# Patient Record
Sex: Female | Born: 1955
Health system: Southern US, Community
[De-identification: ages and names within clinical notes are randomized; demographics above are authoritative.]

## PROBLEM LIST (undated history)

## (undated) DIAGNOSIS — G709 Myoneural disorder, unspecified: Secondary | ICD-10-CM

## (undated) DIAGNOSIS — I10 Essential (primary) hypertension: Secondary | ICD-10-CM

## (undated) DIAGNOSIS — G20A1 Parkinson's disease without dyskinesia, without mention of fluctuations: Secondary | ICD-10-CM

## (undated) DIAGNOSIS — C801 Malignant (primary) neoplasm, unspecified: Secondary | ICD-10-CM

## (undated) DIAGNOSIS — F32A Depression, unspecified: Secondary | ICD-10-CM

## (undated) DIAGNOSIS — M1990 Unspecified osteoarthritis, unspecified site: Secondary | ICD-10-CM

## (undated) HISTORY — PX: BREAST BIOPSY: SHX20

## (undated) HISTORY — DX: Parkinson's disease without dyskinesia, without mention of fluctuations: G20.A1

## (undated) HISTORY — PX: BREAST EXCISIONAL BIOPSY: SUR124

## (undated) HISTORY — PX: BREAST SURGERY: SHX581

## (undated) HISTORY — PX: COLONOSCOPY: SHX174

## (undated) HISTORY — PX: APPENDECTOMY: SHX54

## (undated) HISTORY — DX: Depression, unspecified: F32.A

---

## 1986-10-08 DIAGNOSIS — J189 Pneumonia, unspecified organism: Secondary | ICD-10-CM

## 1986-10-08 HISTORY — DX: Pneumonia, unspecified organism: J18.9

## 1998-07-27 ENCOUNTER — Ambulatory Visit (HOSPITAL_COMMUNITY): Admission: RE | Admit: 1998-07-27 | Discharge: 1998-07-27 | Payer: Self-pay | Admitting: Obstetrics and Gynecology

## 1998-12-28 ENCOUNTER — Other Ambulatory Visit: Admission: RE | Admit: 1998-12-28 | Discharge: 1998-12-28 | Payer: Self-pay | Admitting: Obstetrics and Gynecology

## 1999-01-04 ENCOUNTER — Encounter: Admission: RE | Admit: 1999-01-04 | Discharge: 1999-04-04 | Payer: Self-pay | Admitting: Gynecology

## 1999-08-01 ENCOUNTER — Ambulatory Visit (HOSPITAL_COMMUNITY): Admission: RE | Admit: 1999-08-01 | Discharge: 1999-08-01 | Payer: Self-pay | Admitting: Obstetrics and Gynecology

## 1999-08-01 ENCOUNTER — Encounter: Payer: Self-pay | Admitting: Obstetrics and Gynecology

## 2000-03-27 ENCOUNTER — Other Ambulatory Visit: Admission: RE | Admit: 2000-03-27 | Discharge: 2000-03-27 | Payer: Self-pay | Admitting: Obstetrics and Gynecology

## 2000-08-06 ENCOUNTER — Encounter: Payer: Self-pay | Admitting: Obstetrics and Gynecology

## 2000-08-06 ENCOUNTER — Ambulatory Visit (HOSPITAL_COMMUNITY): Admission: RE | Admit: 2000-08-06 | Discharge: 2000-08-06 | Payer: Self-pay | Admitting: Obstetrics and Gynecology

## 2001-02-19 ENCOUNTER — Other Ambulatory Visit: Admission: RE | Admit: 2001-02-19 | Discharge: 2001-02-19 | Payer: Self-pay | Admitting: Obstetrics and Gynecology

## 2001-08-12 ENCOUNTER — Ambulatory Visit (HOSPITAL_COMMUNITY): Admission: RE | Admit: 2001-08-12 | Discharge: 2001-08-12 | Payer: Self-pay | Admitting: Obstetrics and Gynecology

## 2001-08-12 ENCOUNTER — Encounter: Payer: Self-pay | Admitting: Obstetrics and Gynecology

## 2002-03-24 ENCOUNTER — Other Ambulatory Visit: Admission: RE | Admit: 2002-03-24 | Discharge: 2002-03-24 | Payer: Self-pay | Admitting: Obstetrics and Gynecology

## 2002-08-13 ENCOUNTER — Encounter: Payer: Self-pay | Admitting: Obstetrics and Gynecology

## 2002-08-13 ENCOUNTER — Ambulatory Visit (HOSPITAL_COMMUNITY): Admission: RE | Admit: 2002-08-13 | Discharge: 2002-08-13 | Payer: Self-pay | Admitting: Obstetrics and Gynecology

## 2003-06-09 ENCOUNTER — Other Ambulatory Visit: Admission: RE | Admit: 2003-06-09 | Discharge: 2003-06-09 | Payer: Self-pay | Admitting: Obstetrics and Gynecology

## 2003-12-09 ENCOUNTER — Ambulatory Visit (HOSPITAL_COMMUNITY): Admission: RE | Admit: 2003-12-09 | Discharge: 2003-12-09 | Payer: Self-pay | Admitting: Obstetrics and Gynecology

## 2004-08-15 ENCOUNTER — Other Ambulatory Visit: Admission: RE | Admit: 2004-08-15 | Discharge: 2004-08-15 | Payer: Self-pay | Admitting: Obstetrics and Gynecology

## 2004-11-02 ENCOUNTER — Ambulatory Visit: Payer: Self-pay | Admitting: Family Medicine

## 2005-01-15 ENCOUNTER — Ambulatory Visit (HOSPITAL_COMMUNITY): Admission: RE | Admit: 2005-01-15 | Discharge: 2005-01-15 | Payer: Self-pay | Admitting: Obstetrics and Gynecology

## 2005-02-22 ENCOUNTER — Ambulatory Visit: Payer: Self-pay | Admitting: Family Medicine

## 2005-03-01 ENCOUNTER — Ambulatory Visit: Payer: Self-pay | Admitting: Family Medicine

## 2006-03-14 ENCOUNTER — Ambulatory Visit (HOSPITAL_COMMUNITY): Admission: RE | Admit: 2006-03-14 | Discharge: 2006-03-14 | Payer: Self-pay | Admitting: Obstetrics and Gynecology

## 2006-08-16 ENCOUNTER — Other Ambulatory Visit: Admission: RE | Admit: 2006-08-16 | Discharge: 2006-08-16 | Payer: Self-pay | Admitting: Obstetrics and Gynecology

## 2006-10-15 ENCOUNTER — Ambulatory Visit: Payer: Self-pay | Admitting: Family Medicine

## 2006-12-26 ENCOUNTER — Ambulatory Visit: Payer: Self-pay | Admitting: Family Medicine

## 2007-01-20 ENCOUNTER — Ambulatory Visit: Payer: Self-pay | Admitting: Family Medicine

## 2007-01-30 ENCOUNTER — Ambulatory Visit: Payer: Self-pay | Admitting: Family Medicine

## 2007-03-21 ENCOUNTER — Ambulatory Visit (HOSPITAL_COMMUNITY): Admission: RE | Admit: 2007-03-21 | Discharge: 2007-03-21 | Payer: Self-pay | Admitting: Obstetrics and Gynecology

## 2007-09-15 DIAGNOSIS — N3000 Acute cystitis without hematuria: Secondary | ICD-10-CM

## 2007-09-16 ENCOUNTER — Ambulatory Visit: Payer: Self-pay | Admitting: Family Medicine

## 2008-03-12 ENCOUNTER — Other Ambulatory Visit: Admission: RE | Admit: 2008-03-12 | Discharge: 2008-03-12 | Payer: Self-pay | Admitting: Obstetrics and Gynecology

## 2008-03-22 ENCOUNTER — Ambulatory Visit (HOSPITAL_COMMUNITY): Admission: RE | Admit: 2008-03-22 | Discharge: 2008-03-22 | Payer: Self-pay | Admitting: Obstetrics and Gynecology

## 2008-03-22 ENCOUNTER — Ambulatory Visit: Payer: Self-pay | Admitting: Internal Medicine

## 2008-04-06 ENCOUNTER — Ambulatory Visit: Payer: Self-pay | Admitting: Internal Medicine

## 2009-02-08 ENCOUNTER — Ambulatory Visit: Payer: Self-pay | Admitting: Family Medicine

## 2009-02-08 DIAGNOSIS — L02239 Carbuncle of trunk, unspecified: Secondary | ICD-10-CM | POA: Insufficient documentation

## 2009-02-08 DIAGNOSIS — L02229 Furuncle of trunk, unspecified: Secondary | ICD-10-CM

## 2009-02-23 ENCOUNTER — Ambulatory Visit: Payer: Self-pay | Admitting: Family Medicine

## 2009-02-23 LAB — CONVERTED CEMR LAB
ALT: 21 units/L (ref 0–35)
AST: 18 units/L (ref 0–37)
Albumin: 4.2 g/dL (ref 3.5–5.2)
BUN: 12 mg/dL (ref 6–23)
Basophils Relative: 0.7 % (ref 0.0–3.0)
Bilirubin, Direct: 0 mg/dL (ref 0.0–0.3)
CO2: 32 meq/L (ref 19–32)
Chloride: 104 meq/L (ref 96–112)
Cholesterol: 242 mg/dL — ABNORMAL HIGH (ref 0–200)
Eosinophils Relative: 0.6 % (ref 0.0–5.0)
Glucose, Bld: 88 mg/dL (ref 70–99)
HCT: 39 % (ref 36.0–46.0)
HDL: 61 mg/dL (ref >39.00)
Lymphocytes Relative: 34.5 % (ref 12.0–46.0)
Lymphs Abs: 1.9 10*3/uL (ref 0.7–4.0)
MCV: 92.3 fL (ref 78.0–100.0)
Monocytes Relative: 7.6 % (ref 3.0–12.0)
Neutrophils Relative %: 56.6 % (ref 43.0–77.0)
Platelets: 218 10*3/uL (ref 150.0–400.0)
Protein, U semiquant: NEGATIVE
RBC: 4.23 M/uL (ref 3.87–5.11)
Sodium: 141 meq/L (ref 135–145)
Specific Gravity, Urine: 1.02
Total Bilirubin: 0.7 mg/dL (ref 0.3–1.2)
Total Protein: 7.5 g/dL (ref 6.0–8.3)
VLDL: 14.2 mg/dL (ref 0.0–40.0)
WBC Urine, dipstick: NEGATIVE
WBC: 5.5 10*3/uL (ref 4.5–10.5)
pH: 6

## 2009-03-02 ENCOUNTER — Ambulatory Visit: Payer: Self-pay | Admitting: Family Medicine

## 2009-03-22 ENCOUNTER — Ambulatory Visit: Payer: Self-pay | Admitting: Family Medicine

## 2009-03-22 ENCOUNTER — Encounter: Payer: Self-pay | Admitting: Family Medicine

## 2009-03-23 ENCOUNTER — Ambulatory Visit: Payer: Self-pay | Admitting: Internal Medicine

## 2009-03-23 ENCOUNTER — Encounter: Payer: Self-pay | Admitting: Family Medicine

## 2009-03-24 DIAGNOSIS — L820 Inflamed seborrheic keratosis: Secondary | ICD-10-CM

## 2009-04-01 ENCOUNTER — Telehealth: Payer: Self-pay | Admitting: Family Medicine

## 2010-01-26 ENCOUNTER — Ambulatory Visit (HOSPITAL_COMMUNITY): Admission: RE | Admit: 2010-01-26 | Discharge: 2010-01-26 | Payer: Self-pay | Admitting: Obstetrics and Gynecology

## 2010-04-05 ENCOUNTER — Ambulatory Visit: Payer: Self-pay | Admitting: Internal Medicine

## 2010-04-05 DIAGNOSIS — L03039 Cellulitis of unspecified toe: Secondary | ICD-10-CM

## 2010-10-29 ENCOUNTER — Encounter: Payer: Self-pay | Admitting: Obstetrics and Gynecology

## 2010-11-09 NOTE — Assessment & Plan Note (Signed)
Summary: swollen foot/aches/cjr   Vital Signs:  Patient profile:   55 year old female Weight:      208 pounds Temp:     97.8 degrees F oral BP sitting:   120 / 70  (right arm) Cuff size:   regular  Vitals Entered By: Duard Brady LPN (April 05, 2010 9:18 AM) CC: c/o (L) foot swelling , great toe nail discoloration  Is Patient Diabetic? No   CC:  c/o (L) foot swelling  and great toe nail discoloration .  History of Present Illness: 55 year old patient who is complaining of slight pain, swelling, and redness involving the medial aspect of her left great toe.  She has been evaluated and treated by podiatry in the past and has had surgery for an ingrown nail.  she also complains of nail discoloration and some looseness of the nail.  Due to the painful toe, it has affected her gait, and she is also on some discomfort involving the arch of the left foot.  Denies any fever or chills  Preventive Screening-Counseling & Management  Alcohol-Tobacco     Smoking Status: never  Allergies (verified): No Known Drug Allergies  Physical Exam  General:  overweight-appearing.  no distress.  Blood pressure 120/70 Msk:  a mild paronychia noted involving the medial aspect of the left great toe; the nail was slightly loose, thickened and discolored.   Impression & Recommendations:  Problem # 1:  PARONYCHIA, LEFT GREAT TOE (ICD-681.11) patient has a mild paronychia; will treat with samples of Avelox for 5 days.  Patient Instructions: 1)  Please schedule a follow-up appointment as needed. 2)  Take your antibiotic as prescribed until ALL of it is gone, but stop if you develop a rash or swelling and contact our office as soon as possible.

## 2010-12-25 ENCOUNTER — Other Ambulatory Visit: Payer: Self-pay | Admitting: Obstetrics and Gynecology

## 2010-12-25 DIAGNOSIS — Z1231 Encounter for screening mammogram for malignant neoplasm of breast: Secondary | ICD-10-CM

## 2011-01-29 ENCOUNTER — Ambulatory Visit (HOSPITAL_COMMUNITY)
Admission: RE | Admit: 2011-01-29 | Discharge: 2011-01-29 | Disposition: A | Discharge status: Home / Self Care | Payer: 59 | Source: Ambulatory Visit | Attending: Obstetrics and Gynecology | Admitting: Obstetrics and Gynecology

## 2011-01-29 DIAGNOSIS — Z1231 Encounter for screening mammogram for malignant neoplasm of breast: Secondary | ICD-10-CM | POA: Insufficient documentation

## 2011-12-06 ENCOUNTER — Ambulatory Visit (INDEPENDENT_AMBULATORY_CARE_PROVIDER_SITE_OTHER): Payer: 59 | Admitting: Family Medicine

## 2011-12-06 ENCOUNTER — Encounter: Payer: Self-pay | Admitting: Family Medicine

## 2011-12-06 VITALS — BP 164/90 | Temp 97.8°F | Wt 188.0 lb

## 2011-12-06 DIAGNOSIS — L500 Allergic urticaria: Secondary | ICD-10-CM

## 2011-12-06 MED ORDER — LORAZEPAM 0.5 MG PO TABS: ORAL_TABLET | ORAL | Status: DC

## 2011-12-06 MED ORDER — PREDNISONE 20 MG PO TABS: ORAL_TABLET | ORAL | Status: DC

## 2011-12-06 NOTE — Progress Notes (Signed)
  Subjective:    Patient ID: Sara Nicholson, female    DOB: 08/16/1956, 56 y.o.   MRN: [id number]  HPI Sara Nicholson is a 56 year old married female nonsmoker who comes in today for evaluation of a drug reaction  She states that on about January 4 she saw podiatrist in [facility] who gave her Lamisil tablets for discoloration of her toenails. 2 weeks after starting the medicine she broke out in a rash on chest didn't think much about it continue to take the medicine. On February 17 she broke out all over and went to a dermatologist in [facility] the next day the 18th. She was diagnosed to have a drug reaction and given 40 mg of prednisone for 2 days with a taper however the rash is not going away.  She's been taking Claritin twice a day Atarax 50 each bedtime and still has insomnia from the prednisone.   Review of Systems    general and dermatologic review of systems otherwise negative no previous history of drug reactions Objective:   Physical Exam  Well-developed well-nourished female in no acute distress examination skin shows diffuse hives total body airway normal      Assessment & Plan:  Urticaria secondary to Lamisil plan increase prednisone to 60 mg daily and told rash abates and then taper , DC Atarax, Claritin 10 mg twice a day, Ativan each bedtime when necessary for sleep

## 2011-12-06 NOTE — Patient Instructions (Signed)
Take 3 prednisone tablets now then 3 tablets daily until rash clears then taper as directed  Ativan 0.5 each bedtime when necessary........... you may take it 3 times daily if you need it  Claritin 10 mg plain twice daily  Do the skin treatment we discussed  Return in one week for followup sooner if any problems

## 2011-12-13 ENCOUNTER — Encounter: Payer: Self-pay | Admitting: Family Medicine

## 2011-12-13 ENCOUNTER — Ambulatory Visit (INDEPENDENT_AMBULATORY_CARE_PROVIDER_SITE_OTHER): Payer: 59 | Admitting: Family Medicine

## 2011-12-13 DIAGNOSIS — L500 Allergic urticaria: Secondary | ICD-10-CM

## 2011-12-13 DIAGNOSIS — T50905A Adverse effect of unspecified drugs, medicaments and biological substances, initial encounter: Secondary | ICD-10-CM

## 2011-12-13 NOTE — Progress Notes (Signed)
  Subjective:    Patient ID: Sara Nicholson, female    DOB: Apr 08, 1956, 56 y.o.   MRN: [id number]  HPI Shawn is a 56 year old married female nonsmoker who comes in today for evaluation of urticaria secondary to a medication called Lamisil  We saw her the other day with diffuse urticaria and began 60 mg of prednisone daily. She comes back today saying she's about 80% better. The rash is not completely gone but most of the itching is gone. She is having significant CNS side effects from the steroids weight gain blood pressure 140/90 fluid retention     Review of Systems General and dermatologic review of systems otherwise negative    Objective:   Physical Exam  Well-developed well-nourished female in no acute distress examination of skin shows the rash to be markedly diminished although still present on her back abdomen chest      Assessment & Plan:  Urticaria secondary to Lamisil taper prednisone slowly

## 2012-01-22 ENCOUNTER — Ambulatory Visit (INDEPENDENT_AMBULATORY_CARE_PROVIDER_SITE_OTHER): Payer: 59 | Admitting: Family

## 2012-01-22 ENCOUNTER — Encounter: Payer: Self-pay | Admitting: Family

## 2012-01-22 VITALS — BP 138/90 | Temp 98.2°F | Wt 190.0 lb

## 2012-01-22 DIAGNOSIS — R05 Cough: Secondary | ICD-10-CM

## 2012-01-22 DIAGNOSIS — J0100 Acute maxillary sinusitis, unspecified: Secondary | ICD-10-CM

## 2012-01-22 MED ORDER — HYDROCOD POLST-CHLORPHEN POLST 10-8 MG/5ML PO LQCR: 5.0000 mL | Freq: Two times a day (BID) | ORAL | Status: DC | PRN

## 2012-01-22 MED ORDER — AMOXICILLIN 500 MG PO TABS: 1000.0000 mg | ORAL_TABLET | Freq: Two times a day (BID) | ORAL | Status: AC

## 2012-01-22 NOTE — Progress Notes (Signed)
  Subjective:    Patient ID: Sara Nicholson, female    DOB: [birth year], 56 y.o.   MRN: [id number]  Sinusitis This is a recurrent problem. The current episode started more than 1 month ago. The problem has been gradually worsening since onset. There has been no fever. The pain is moderate. Associated symptoms include congestion, coughing, headaches, sinus pressure, sneezing, a sore throat and swollen glands. Past treatments include oral decongestants. The treatment provided no relief.      Review of Systems  Constitutional: Negative.   HENT: Positive for congestion, sore throat, sneezing and sinus pressure.   Respiratory: Positive for cough.   Cardiovascular: Negative.   Gastrointestinal: Negative.   Neurological: Positive for headaches.  Hematological: Negative.   Psychiatric/Behavioral: Negative.    No past medical history on file.  History   Social History  . Marital Status: Married    Spouse Name: N/A    Number of Children: N/A  . Years of Education: N/A   Occupational History  . Not on file.   Social History Main Topics  . Smoking status: Never Smoker   . Smokeless tobacco: Not on file  . Alcohol Use:   . Drug Use:   . Sexually Active:    Other Topics Concern  . Not on file   Social History Narrative  . No narrative on file    No past surgical history on file.  No family history on file.  Allergies  Allergen Reactions  . Lamisil     Current Outpatient Prescriptions on File Prior to Visit  Medication Sig Dispense Refill  . LORazepam (ATIVAN) 0.5 MG tablet 1 tablet each bedtime when necessary  30 tablet  1  . predniSONE (DELTASONE) 20 MG tablet 3 tabs now then 3 tabs every morning until rash clears then taper 2 tabs x3 days, 1 tab x3 days, a half a tab x3 days, then a half a tablet Monday Wednesday Friday for a 3 week taper  50 tablet  1    BP 138/90  Temp(Src) 98.2 F (36.8 C) (Oral)  Wt 190 lb (86.183 kg)chart    Objective:   Physical Exam    Constitutional: She is oriented to person, place, and time. She appears well-developed and well-nourished.  HENT:  Right Ear: External ear normal.  Left Ear: External ear normal.  Nose: Nose normal.  Mouth/Throat: Oropharynx is clear and moist.  Neck: Normal range of motion. Neck supple.  Cardiovascular: Normal rate, regular rhythm and normal heart sounds.   Pulmonary/Chest: Effort normal and breath sounds normal.  Musculoskeletal: Normal range of motion.  Neurological: She is alert and oriented to person, place, and time.  Skin: Skin is warm and dry.          Assessment & Plan:  Assessment: Acute Sinusitis, Cough  Plan: Tussionex, Amoxil,  Rest, drink plenty of fluids. Call the office if symptoms worsen or persist. Recheck as scheduled and prn.

## 2012-01-22 NOTE — Patient Instructions (Signed)

## 2012-02-27 ENCOUNTER — Other Ambulatory Visit: Payer: Self-pay | Admitting: Obstetrics and Gynecology

## 2012-02-27 DIAGNOSIS — Z1231 Encounter for screening mammogram for malignant neoplasm of breast: Secondary | ICD-10-CM

## 2012-03-25 ENCOUNTER — Ambulatory Visit (HOSPITAL_COMMUNITY)
Admission: RE | Admit: 2012-03-25 | Discharge: 2012-03-25 | Disposition: A | Discharge status: Home / Self Care | Payer: 59 | Source: Ambulatory Visit | Attending: Obstetrics and Gynecology | Admitting: Obstetrics and Gynecology

## 2012-03-25 DIAGNOSIS — Z1231 Encounter for screening mammogram for malignant neoplasm of breast: Secondary | ICD-10-CM | POA: Insufficient documentation

## 2012-03-31 ENCOUNTER — Other Ambulatory Visit: Payer: Self-pay | Admitting: Obstetrics and Gynecology

## 2012-03-31 DIAGNOSIS — R928 Other abnormal and inconclusive findings on diagnostic imaging of breast: Secondary | ICD-10-CM

## 2012-04-04 ENCOUNTER — Other Ambulatory Visit: Payer: Self-pay | Admitting: Obstetrics and Gynecology

## 2012-04-04 ENCOUNTER — Ambulatory Visit
Admission: RE | Admit: 2012-04-04 | Discharge: 2012-04-04 | Disposition: A | Discharge status: Home / Self Care | Payer: 59 | Source: Ambulatory Visit | Attending: Obstetrics and Gynecology | Admitting: Obstetrics and Gynecology

## 2012-04-04 DIAGNOSIS — R928 Other abnormal and inconclusive findings on diagnostic imaging of breast: Secondary | ICD-10-CM

## 2012-04-04 DIAGNOSIS — N6320 Unspecified lump in the left breast, unspecified quadrant: Secondary | ICD-10-CM

## 2012-04-07 ENCOUNTER — Other Ambulatory Visit: Payer: Self-pay | Admitting: Obstetrics and Gynecology

## 2012-04-07 ENCOUNTER — Ambulatory Visit
Admission: RE | Admit: 2012-04-07 | Discharge: 2012-04-07 | Disposition: A | Discharge status: Home / Self Care | Payer: 59 | Source: Ambulatory Visit | Attending: Obstetrics and Gynecology | Admitting: Obstetrics and Gynecology

## 2012-04-07 DIAGNOSIS — N6320 Unspecified lump in the left breast, unspecified quadrant: Secondary | ICD-10-CM

## 2013-03-13 ENCOUNTER — Other Ambulatory Visit: Payer: Self-pay | Admitting: Obstetrics and Gynecology

## 2013-03-13 DIAGNOSIS — Z1231 Encounter for screening mammogram for malignant neoplasm of breast: Secondary | ICD-10-CM

## 2013-03-16 ENCOUNTER — Telehealth: Payer: Self-pay | Admitting: Gynecology

## 2013-03-16 ENCOUNTER — Ambulatory Visit (INDEPENDENT_AMBULATORY_CARE_PROVIDER_SITE_OTHER): Payer: 59 | Admitting: Family Medicine

## 2013-03-16 ENCOUNTER — Encounter: Payer: Self-pay | Admitting: Family Medicine

## 2013-03-16 VITALS — BP 130/80 | Temp 98.7°F | Wt 196.0 lb

## 2013-03-16 DIAGNOSIS — T148 Other injury of unspecified body region: Secondary | ICD-10-CM

## 2013-03-16 DIAGNOSIS — N951 Menopausal and female climacteric states: Secondary | ICD-10-CM | POA: Insufficient documentation

## 2013-03-16 DIAGNOSIS — W57XXXA Bitten or stung by nonvenomous insect and other nonvenomous arthropods, initial encounter: Secondary | ICD-10-CM | POA: Insufficient documentation

## 2013-03-16 DIAGNOSIS — N959 Unspecified menopausal and perimenopausal disorder: Secondary | ICD-10-CM

## 2013-03-16 MED ORDER — CONJ ESTROG-MEDROXYPROGEST ACE 0.3-1.5 MG PO TABS: 1.0000 | ORAL_TABLET | Freq: Every day | ORAL | Status: DC

## 2013-03-16 NOTE — Patient Instructions (Signed)
Begin Prempro one tablet daily at bedtime  Aspirin 1 daily  Followup in 4 weeks

## 2013-03-16 NOTE — Telephone Encounter (Signed)
CALLED AND LEFT MESSAGE TO RETURN CALL TO OUR OFFICE FOR PAP SMEAR REPORT.. IN PROCESS OF TYPING PATIENT RETURNED CALL. PATIENT REQUEST THAT HER PAP SMEAR REPORT BE SCANNED INTO HER CHART IN EPIC DUE TO INSURANCE REASONS AND FOR HER PCP TO BE ABLE TO SEE REPORT. PAPER CHART REPORT PUT IN SCANNING AREA FOR TO BE SCANNED INTO EPIC. SUE

## 2013-03-16 NOTE — Progress Notes (Signed)
  Subjective:    Patient ID: Sara Nicholson, female    DOB: 03-23-1956, 57 y.o.   MRN: [id number]  HPI Sara Nicholson is a 57 year old female married nonsmoker who comes in today for evaluation of 2 problems  She has a bug bite on her left lower leg it doesn't seem like it's healing  In conversation she says she's not feeling well. She feels depressed she can't sleep at night she is waking up soaking wet and other symptoms of menopause. Her LMP was about 2 years ago. She had a pelvic by her GYN which was normal. Over the last couple months her symptoms are getting worse. She would like to do something about it   Review of Systems Review of systems otherwise negative    Objective:   Physical Exam Well-developed well-nourished female no acute distress examination the skin shows a dime size hemorrhagic lesion left lower extremity probably just some hemorrhage around a spider bite       Assessment & Plan:  Lesion lower extremity observe  Postmenopausal symptoms with severe symptoms of depression mood changes hot flashes and sleep dysfunction begin HRT

## 2013-03-16 NOTE — Telephone Encounter (Signed)
Patient was wanting to know the results of her Pap smear. She was in with her regular doctor and he had told her that she was due for her pap. She told him that she had had it in March. He said he saw no record of it.

## 2013-03-17 NOTE — Telephone Encounter (Signed)
Report was scanned 03/16/13

## 2013-03-31 ENCOUNTER — Ambulatory Visit (HOSPITAL_COMMUNITY)
Admission: RE | Admit: 2013-03-31 | Discharge: 2013-03-31 | Disposition: A | Discharge status: Home / Self Care | Payer: 59 | Source: Ambulatory Visit | Attending: Obstetrics and Gynecology | Admitting: Obstetrics and Gynecology

## 2013-03-31 DIAGNOSIS — Z1231 Encounter for screening mammogram for malignant neoplasm of breast: Secondary | ICD-10-CM | POA: Insufficient documentation

## 2013-04-02 ENCOUNTER — Other Ambulatory Visit: Payer: Self-pay | Admitting: Obstetrics and Gynecology

## 2013-04-02 DIAGNOSIS — R928 Other abnormal and inconclusive findings on diagnostic imaging of breast: Secondary | ICD-10-CM

## 2013-04-14 ENCOUNTER — Encounter: Payer: Self-pay | Admitting: Family Medicine

## 2013-04-14 ENCOUNTER — Ambulatory Visit (INDEPENDENT_AMBULATORY_CARE_PROVIDER_SITE_OTHER): Payer: 59 | Admitting: Family Medicine

## 2013-04-14 VITALS — BP 130/80 | Temp 98.1°F | Wt 191.0 lb

## 2013-04-14 DIAGNOSIS — N6011 Diffuse cystic mastopathy of right breast: Secondary | ICD-10-CM

## 2013-04-14 DIAGNOSIS — N6019 Diffuse cystic mastopathy of unspecified breast: Secondary | ICD-10-CM

## 2013-04-14 NOTE — Progress Notes (Signed)
  Subjective:    Patient ID: Sara Nicholson, female    DOB: [birth year], 57 y.o.   MRN: [id number]  Sara Nicholson is a 57 year old married female nonsmoker who comes in today for evaluation of 2 problems  We started her on HRT Prempro 0.3-1.5 daily because of severe hot flushes and sleep dysfunction. She now is sleeping well the hot flashes are present but minimal.  No side effects from the medication  She recently had a screening mammogram which showed some abnormalities in her right breast. She's to go back to the breast Center on July 9 for followup ultrasound and possible tomography. She had a cystic lesion left breast which was aspirated last year and she previously had a lesion on her left breast that was removed surgically about 10 years ago.    Review of Systems    review of systems otherwise negative Objective:   Physical Exam Well-developed and nourished female no acute distress vital signs stable afebrile BP normal 130/80  Breast exam shows a scar left breast 9:00 from previous excisional biopsy 10 years ago. No palpable masses  Right breast appears normal skin normal no nipple discharge or areolar rash. There is a palpable lesion an inch from her nipple at the 2:00 position. It soft rubbery movable. She also has some thickening in the rubbery mass at the 9:00 position just adjacent to the nipple. She also has multiple other BB sized fibrocystic changes.       Assessment & Plan:  Postmenopausal with HRT working well continue that for now  Fibrocystic breast changes followup in radiology

## 2013-04-14 NOTE — Patient Instructions (Signed)
Continue the HRT  Take a baby aspirin once daily  Followup in 3 months sooner if any problems

## 2013-04-15 ENCOUNTER — Ambulatory Visit
Admission: RE | Admit: 2013-04-15 | Discharge: 2013-04-15 | Disposition: A | Discharge status: Home / Self Care | Payer: 59 | Source: Ambulatory Visit | Attending: Obstetrics and Gynecology | Admitting: Obstetrics and Gynecology

## 2013-04-15 ENCOUNTER — Ambulatory Visit: Payer: 59 | Admitting: Family Medicine

## 2013-04-15 DIAGNOSIS — R928 Other abnormal and inconclusive findings on diagnostic imaging of breast: Secondary | ICD-10-CM

## 2013-05-04 ENCOUNTER — Other Ambulatory Visit: Payer: Self-pay | Admitting: *Deleted

## 2013-05-04 DIAGNOSIS — N951 Menopausal and female climacteric states: Secondary | ICD-10-CM

## 2013-05-04 MED ORDER — CONJ ESTROG-MEDROXYPROGEST ACE 0.3-1.5 MG PO TABS: 1.0000 | ORAL_TABLET | Freq: Every day | ORAL | Status: DC

## 2013-07-16 ENCOUNTER — Encounter: Payer: Self-pay | Admitting: Family Medicine

## 2013-07-16 ENCOUNTER — Ambulatory Visit (INDEPENDENT_AMBULATORY_CARE_PROVIDER_SITE_OTHER): Payer: 59 | Admitting: Family Medicine

## 2013-07-16 VITALS — BP 130/80 | Temp 98.0°F | Wt 188.0 lb

## 2013-07-16 DIAGNOSIS — N959 Unspecified menopausal and perimenopausal disorder: Secondary | ICD-10-CM

## 2013-07-16 DIAGNOSIS — N951 Menopausal and female climacteric states: Secondary | ICD-10-CM

## 2013-07-16 NOTE — Patient Instructions (Signed)
Continue current medications  Take a baby aspirin daily  Return in the spring for CPX

## 2013-07-16 NOTE — Progress Notes (Signed)
  Subjective:    Patient ID: Sara Nicholson, female    DOB: [birth year], 57 y.o.   MRN: [id number]  HPI [name] is a 57 year old married female nonsmoker who comes in today for followup of HRT  She came in a while back with severe menopausal symptoms. We discussed various options including the use of HRT to Prempro. She elected to try a low dose with the understanding of all the potential side effects. She comes back today for followup. She says she feels well blood pressure today is normal. She forgot to take an aspirin tablet daily.  She says she feels better no hot flashes sleep is better her sex life is better    Review of Systems    review of systems negative Objective:   Physical Exam  Well-developed well-nourished female no acute distress vital signs stable afebrile BP normal      Assessment & Plan:  Postmenopausal with improvement of symptoms with HRT continue low-dose HRT return in the spring for CPX

## 2013-08-13 ENCOUNTER — Other Ambulatory Visit: Payer: Self-pay

## 2013-10-23 ENCOUNTER — Other Ambulatory Visit: Payer: Self-pay | Admitting: Family Medicine

## 2013-10-23 DIAGNOSIS — N6310 Unspecified lump in the right breast, unspecified quadrant: Secondary | ICD-10-CM

## 2013-11-06 ENCOUNTER — Ambulatory Visit
Admission: RE | Admit: 2013-11-06 | Discharge: 2013-11-06 | Disposition: A | Discharge status: Home / Self Care | Payer: 59 | Source: Ambulatory Visit | Attending: Family Medicine | Admitting: Family Medicine

## 2013-11-06 DIAGNOSIS — N6310 Unspecified lump in the right breast, unspecified quadrant: Secondary | ICD-10-CM

## 2013-12-08 ENCOUNTER — Other Ambulatory Visit (INDEPENDENT_AMBULATORY_CARE_PROVIDER_SITE_OTHER): Payer: 59

## 2013-12-08 DIAGNOSIS — Z0000 Encounter for general adult medical examination without abnormal findings: Secondary | ICD-10-CM

## 2013-12-08 LAB — BASIC METABOLIC PANEL
BUN: 12 mg/dL (ref 6–23)
CHLORIDE: 103 meq/L (ref 96–112)
CO2: 31 meq/L (ref 19–32)
CREATININE: 0.7 mg/dL (ref 0.4–1.2)
Calcium: 9.4 mg/dL (ref 8.4–10.5)
GFR: 97.82 mL/min (ref >60.00)
Glucose, Bld: 85 mg/dL (ref 70–99)
POTASSIUM: 4.3 meq/L (ref 3.5–5.1)
Sodium: 137 mEq/L (ref 135–145)

## 2013-12-08 LAB — POCT URINALYSIS DIPSTICK
Glucose, UA: NEGATIVE
LEUKOCYTES UA: NEGATIVE
NITRITE UA: NEGATIVE
PH UA: 5.5
PROTEIN UA: NEGATIVE
Spec Grav, UA: >=1.03
Urobilinogen, UA: 0.2

## 2013-12-08 LAB — CBC WITH DIFFERENTIAL/PLATELET
Basophils Absolute: 0 10*3/uL (ref 0.0–0.1)
Basophils Relative: 0.8 % (ref 0.0–3.0)
EOS ABS: 0 10*3/uL (ref 0.0–0.7)
EOS PCT: 0.6 % (ref 0.0–5.0)
HEMATOCRIT: 41.3 % (ref 36.0–46.0)
Hemoglobin: 13.7 g/dL (ref 12.0–15.0)
LYMPHS ABS: 2 10*3/uL (ref 0.7–4.0)
Lymphocytes Relative: 38.5 % (ref 12.0–46.0)
MCHC: 33.2 g/dL (ref 30.0–36.0)
MCV: 94.1 fl (ref 78.0–100.0)
MONO ABS: 0.4 10*3/uL (ref 0.1–1.0)
Monocytes Relative: 7.1 % (ref 3.0–12.0)
NEUTROS PCT: 53 % (ref 43.0–77.0)
Neutro Abs: 2.7 10*3/uL (ref 1.4–7.7)
Platelets: 219 10*3/uL (ref 150.0–400.0)
RBC: 4.39 Mil/uL (ref 3.87–5.11)
RDW: 13.2 % (ref 11.5–14.6)
WBC: 5.1 10*3/uL (ref 4.5–10.5)

## 2013-12-08 LAB — LIPID PANEL
CHOLESTEROL: 221 mg/dL — AB (ref 0–200)
HDL: 68.8 mg/dL (ref >39.00)
LDL Cholesterol: 135 mg/dL — ABNORMAL HIGH (ref 0–99)
Total CHOL/HDL Ratio: 3
Triglycerides: 87 mg/dL (ref 0.0–149.0)
VLDL: 17.4 mg/dL (ref 0.0–40.0)

## 2013-12-08 LAB — TSH: TSH: 0.99 u[IU]/mL (ref 0.35–5.50)

## 2013-12-08 LAB — HEPATIC FUNCTION PANEL
ALT: 34 U/L (ref 0–35)
AST: 26 U/L (ref 0–37)
Albumin: 4.2 g/dL (ref 3.5–5.2)
Alkaline Phosphatase: 72 U/L (ref 39–117)
BILIRUBIN DIRECT: 0 mg/dL (ref 0.0–0.3)
Total Bilirubin: 1.1 mg/dL (ref 0.3–1.2)
Total Protein: 7.5 g/dL (ref 6.0–8.3)

## 2013-12-15 ENCOUNTER — Encounter: Payer: Self-pay | Admitting: Family Medicine

## 2013-12-15 ENCOUNTER — Ambulatory Visit (INDEPENDENT_AMBULATORY_CARE_PROVIDER_SITE_OTHER): Payer: 59 | Admitting: Family Medicine

## 2013-12-15 VITALS — BP 140/80 | Temp 98.0°F | Ht 64.0 in | Wt 190.0 lb

## 2013-12-15 DIAGNOSIS — N6019 Diffuse cystic mastopathy of unspecified breast: Secondary | ICD-10-CM

## 2013-12-15 DIAGNOSIS — N959 Unspecified menopausal and perimenopausal disorder: Secondary | ICD-10-CM

## 2013-12-15 DIAGNOSIS — N951 Menopausal and female climacteric states: Secondary | ICD-10-CM

## 2013-12-15 MED ORDER — CONJ ESTROG-MEDROXYPROGEST ACE 0.3-1.5 MG PO TABS: 1.0000 | ORAL_TABLET | Freq: Every day | ORAL | Status: DC

## 2013-12-15 NOTE — Patient Instructions (Addendum)
Continue diet and exercise program  Continue Prempro one tablet daily  Followup in 1 year sooner if any problem  Try to remember to do a good breast exam once a month on your birthday

## 2013-12-15 NOTE — Progress Notes (Signed)
Pre visit review using our clinic review tool, if applicable. No additional management support is needed unless otherwise documented below in the visit note. 

## 2013-12-15 NOTE — Progress Notes (Signed)
   Subjective:    Patient ID: Sara Nicholson, female    DOB: Jan 08, 1956, 58 y.o.   MRN: [id number]  HPI [name] is a delightful 58 year old married female nonsmoker who comes in today for general physical examination  We started her on Prempro 0.3 last year because of severe post menopausal hot flashes and insomnia. She's not sleeping well no complaints no side effects to medication  Her weight is 190 pounds. She's lost 4 pounds she beginning a diet and exercise program.  She gets routine eye care, dental care, does not check her breasts monthly however she does get annual mammogram. Her mammograms are difficult to interpret because she has multiple cysts. Colonoscopy originally in her 58s was normal. Tetanus booster 2010.   Review of Systems  Constitutional: Negative.   HENT: Negative.   Eyes: Negative.   Respiratory: Negative.   Cardiovascular: Negative.   Gastrointestinal: Negative.   Endocrine: Negative.   Genitourinary: Negative.   Musculoskeletal: Negative.   Allergic/Immunologic: Negative.   Neurological: Negative.   Hematological: Negative.   Psychiatric/Behavioral: Negative.        Objective:   Physical Exam  Nursing note and vitals reviewed. Constitutional: She appears well-developed and well-nourished.  HENT:  Head: Normocephalic and atraumatic.  Right Ear: External ear normal.  Left Ear: External ear normal.  Nose: Nose normal.  Mouth/Throat: Oropharynx is clear and moist.  Eyes: EOM are normal. Pupils are equal, round, and reactive to light.  Neck: Normal range of motion. Neck supple. No thyromegaly present.  Cardiovascular: Normal rate, regular rhythm, normal heart sounds and intact distal pulses.  Exam reveals no gallop and no friction rub.   No murmur heard. Pulmonary/Chest: Effort normal and breath sounds normal.  Abdominal: Soft. Bowel sounds are normal. She exhibits no distension and no mass. There is no tenderness. There is no rebound.  Genitourinary:     No history of GYN disorder Pap smear every 3-5 years Bilateral breast exam normal except for multiple small fibrocystic changes  Musculoskeletal: Normal range of motion.  Lymphadenopathy:    She has no cervical adenopathy.  Neurological: She is alert. She has normal reflexes. No cranial nerve deficit. She exhibits normal muscle tone. Coordination normal.  Skin: Skin is warm and dry.  Total body skin exam normal  Psychiatric: She has a normal mood and affect. Her behavior is normal. Judgment and thought content normal.          Assessment & Plan:  Healthy female  Postmenopausal hot flashes continue Prempro  Overweight continue diet exercise and weight loss  Fibrocystic breast changes follow recommendations for mammography

## 2014-04-07 ENCOUNTER — Other Ambulatory Visit: Payer: Self-pay | Admitting: Family Medicine

## 2014-04-07 DIAGNOSIS — N6310 Unspecified lump in the right breast, unspecified quadrant: Secondary | ICD-10-CM

## 2014-04-28 ENCOUNTER — Other Ambulatory Visit: Payer: Self-pay | Admitting: Family Medicine

## 2014-04-28 ENCOUNTER — Ambulatory Visit
Admission: RE | Admit: 2014-04-28 | Discharge: 2014-04-28 | Disposition: A | Discharge status: Home / Self Care | Payer: 59 | Source: Ambulatory Visit | Attending: Family Medicine | Admitting: Family Medicine

## 2014-04-28 DIAGNOSIS — N6310 Unspecified lump in the right breast, unspecified quadrant: Secondary | ICD-10-CM

## 2014-06-15 ENCOUNTER — Encounter: Payer: Self-pay | Admitting: Internal Medicine

## 2015-01-24 ENCOUNTER — Other Ambulatory Visit: Payer: Self-pay | Admitting: Family Medicine

## 2015-06-15 ENCOUNTER — Other Ambulatory Visit: Payer: Self-pay

## 2015-06-15 DIAGNOSIS — Z1231 Encounter for screening mammogram for malignant neoplasm of breast: Secondary | ICD-10-CM

## 2015-06-21 ENCOUNTER — Ambulatory Visit
Admission: RE | Admit: 2015-06-21 | Discharge: 2015-06-21 | Disposition: A | Discharge status: Home / Self Care | Payer: Commercial Managed Care - HMO | Source: Ambulatory Visit

## 2015-06-21 DIAGNOSIS — Z1231 Encounter for screening mammogram for malignant neoplasm of breast: Secondary | ICD-10-CM

## 2015-07-12 ENCOUNTER — Telehealth: Payer: Self-pay | Admitting: Family Medicine

## 2015-07-12 NOTE — Telephone Encounter (Signed)
Pt would like her chart updated that she received flu shot at work today, [employer]

## 2015-11-08 ENCOUNTER — Telehealth: Payer: Self-pay | Admitting: Family Medicine

## 2015-11-08 NOTE — Telephone Encounter (Signed)
Pt call to say she need a referral to see [facility]. Does she need an appt.

## 2015-11-09 NOTE — Telephone Encounter (Signed)
No appointment needed. Referral placed.

## 2016-01-05 ENCOUNTER — Ambulatory Visit (INDEPENDENT_AMBULATORY_CARE_PROVIDER_SITE_OTHER): Payer: Managed Care, Other (non HMO) | Admitting: Family Medicine

## 2016-01-05 ENCOUNTER — Encounter: Payer: Self-pay | Admitting: Family Medicine

## 2016-01-05 VITALS — BP 146/80 | HR 57 | Temp 98.5°F | Resp 20 | Ht 64.0 in | Wt 210.0 lb

## 2016-01-05 DIAGNOSIS — R030 Elevated blood-pressure reading, without diagnosis of hypertension: Secondary | ICD-10-CM | POA: Diagnosis not present

## 2016-01-05 DIAGNOSIS — L84 Corns and callosities: Secondary | ICD-10-CM

## 2016-01-05 NOTE — Patient Instructions (Signed)
Corns and Calluses Corns are small areas of thickened skin that occur on the top, sides, or tip of a toe. They contain a cone-shaped core with a point that can press on a nerve below. This causes pain. Calluses are areas of thickened skin that can occur anywhere on the body including hands, fingers, palms, soles of the feet, and heels.Calluses are usually larger than corns.  CAUSES  Corns and calluses are caused by rubbing (friction) or pressure, such as from shoes that are too tight or do not fit properly.  RISK FACTORS Corns are more likely to develop in people who have toe deformities, such as hammer toes. Since calluses can occur with friction to any area of the skin, calluses are more likely to develop in people who:   Work with their hands.  Wear shoes that fit poorly, shoes that are too tight, or shoes that are high-heeled.  Have toes deformities. SYMPTOMS Symptoms of a corn or callus include:  A hard growth on the skin.   Pain or tenderness under the skin.   Redness and swelling.   Increased discomfort while wearing tight-fitting shoes. DIAGNOSIS  Corns and calluses may be diagnosed with a medical history and physical exam.  TREATMENT  Corns and calluses may be treated with:  Removing the cause of the friction or pressure. This may include:  Changing your shoes.  Wearing shoe inserts (orthotics) or other protective layers in your shoes, such as a corn pad.  Wearing gloves.  Medicines to help soften skin in the hardened, thickened areas.  Reducing the size of the corn or callus by removing the dead layers of skin.  Antibiotic medicines to treat infection.  Surgery, if a toe deformity is the cause. HOME CARE INSTRUCTIONS   If you were prescribed an antibiotic, finish all of it even if you start to feel better.  Wear shoes that fit well. Avoid wearing high-heeled shoes and shoes that are too tight or too loose.  Wear any padding, protective layers, gloves, or  orthotics as directed by your health care provider.  Soak your hands or feet and then use a file or pumice stone to soften your corn or callus. Do this as directed by your health care provider.  Check your corn or callus every day for signs of infection. Watch for:  Redness, swelling, or pain.  Fluid, blood, or pus.  Podiatry evaluation if symptoms get worse. .  Blood pressure slightly elevated. Monitor BP at home if possible. Continue regular exercise. DASH diet may help to prevent hypertension: high in vegetables, fruits, low-fat dairy products, whole grains, poultry, fish, and nuts; and low in sweets, sugar-sweetened beverages, and red meats.

## 2016-01-05 NOTE — Progress Notes (Signed)
HPI:  Sara Nicholson is here today concerned about "lump" she felt a couple weeks ago when she was rubbing right forearm. She has not noted any changes and notes that left forearm has similar feeling. No hx of trauma, no limitation of ROM, no skin changes. She just wants to be sure it is not something she needs to be worried about.  She is also c/o 2 months of left foot lesion on plantar area, under 5th MTP joint, no Hx of trauma. She has noted mild "stinging" sensation upon walking after exercising and worse with certain shoes. No limitation of her daily activities, she feels like it may be growing a "little" since first noted. Relieved with rest. She has Hx of plantar fascitis, has folowed ith podiatrists in the past.  Her BP is mildly elevated today.She denies any hx of HTN, does not check BP regularly but her BP's during prior visits to her gyn reported in the 120's/70's. She has not noted severe headaches, chest pain, dyspnea, abdominal pain, or edema. + Stress at work.    Drug Allergies:  Allergies  Allergen Reactions  . Terbinafine Hcl    ROS:  CONSTITUTIONAL: Denies fever, chills, fatigue, changes in appetite.  HEENT: Negative for associated oral lesions, odynophagia, or dysphagia.  HEART:Denies chest pain, palpitations, exertional dyspnea, edema, or cold extremity.  LUNG: Denies cough, dyspnea, wheezing, hemoptysis.  ABDOMEN: Denies changes in bowel habits, nausea, vomiting, blood in stool, or melena.  SKIN: Denies skin lesions/rash, ulcers,changes in skin, suspicious lesions.  MUSCLE/BONES: Denies associated myalgias, back pain, arthralgias,limitation in ROM, or joint edema.  NERVOUS SYSTEM:Denies frequent/severe headaches, focal weakness, numbness, tingling, or tremor.  MENTAL HEALTH: positive fos high stress level at work.Denies depression or anxiety history.    No past medical history on file.  No past surgical history on file.  Social History   Social  History  . Marital Status: Married    Spouse Name: N/A  . Number of Children: N/A  . Years of Education: N/A   Social History Main Topics  . Smoking status: Never Smoker   . Smokeless tobacco: None  . Alcohol Use: None  . Drug Use: None  . Sexual Activity: Not Asked   Other Topics Concern  . None   Social History Narrative     Current outpatient prescriptions:  .  ibuprofen (ADVIL) 200 MG tablet, Take 400 mg by mouth as needed., Disp: , Rfl:   EXAM:  Filed Vitals:   01/05/16 1305  BP: 146/80  Pulse: 57  Temp: 98.5 F (36.9 C)  Resp: 20     Body mass index is 36.03 kg/(m^2).  GENERAL: vitals reviewed and listed above, alert, oriented, appears well hydrated and in no acute distress  EYE: conjunttiva clear.  NECK: no obvious masses on inspection, no lymphadenopathy.  LUNGS: clear to auscultation bilaterally, no wheezes, rales or rhonchi.  CV: Regular rate and rhythm, no murmur appreciated.No LE edema and DP pulse present bilateral.  MS: On upper extremities I do not appreciate masses or deformities, right UE joints with ROM normal. Left foot: anterior aspect (between 2-3 MTP joint, and under 5th MTP joint)  with local skin thickness, rounded, no tender, no erythema or induration.   SKIN: No erythematous rash or suspicious lesions.  NEURO: Alert and oriented x 3, normal gait.  PSYCH: pleasant and cooperative, no obvious depression, mildly anxious.   ASSESSMENT AND PLAN:   Callus of foot: General discussion about Dx and some treatment options.She can  follow with podiatrists if symptoms get worse. Comfortable shoe wear recommended. F/U as needed.  Elevated blood pressure (not hypertension): Rechecked 143/80. No Hx of HTN. Recommended monitoring BP at home if possible.She has been under some stress at work. Follow if BP persistently > 140/90.  -Reassure in regard to "lump" in right arm.Examination today was negative for masses or abnormalities on area of  concern. Continue monitoring and follow if needed.       [place], Vernie Vinciguerra G , MD  [place]. [place] office.

## 2016-01-05 NOTE — Progress Notes (Signed)
Pre visit review using our clinic review tool, if applicable. No additional management support is needed unless otherwise documented below in the visit note. 

## 2016-03-20 ENCOUNTER — Encounter: Payer: Self-pay | Admitting: Adult Health

## 2016-03-20 ENCOUNTER — Ambulatory Visit (INDEPENDENT_AMBULATORY_CARE_PROVIDER_SITE_OTHER): Payer: Managed Care, Other (non HMO) | Admitting: Adult Health

## 2016-03-20 ENCOUNTER — Encounter: Payer: Self-pay | Admitting: Family Medicine

## 2016-03-20 VITALS — BP 130/80 | HR 59 | Temp 97.7°F | Ht 64.0 in | Wt 203.0 lb

## 2016-03-20 DIAGNOSIS — D489 Neoplasm of uncertain behavior, unspecified: Secondary | ICD-10-CM

## 2016-03-20 DIAGNOSIS — D492 Neoplasm of unspecified behavior of bone, soft tissue, and skin: Secondary | ICD-10-CM

## 2016-03-20 DIAGNOSIS — C4492 Squamous cell carcinoma of skin, unspecified: Secondary | ICD-10-CM | POA: Diagnosis not present

## 2016-03-20 NOTE — Progress Notes (Addendum)
   Subjective:    Patient ID: Sara Nicholson, female    DOB: 1956-07-26, 60 y.o.   MRN: [id number]  HPI  60 year old female who presents to the office today for the acute complaint of a " mole on my left shoulder". She reports that this area in question has " just popped up" and grown in size. The lesion is " itchy". She denies any redness, warmth, discharge from the lesion.   Does not have a history of skin cancer  Review of Systems  Constitutional: Negative.   Skin: Positive for wound. Negative for color change, pallor and rash.  All other systems reviewed and are negative.      Objective:   Physical Exam  Constitutional: She is oriented to person, place, and time.  Neurological: She is alert and oriented to person, place, and time.  Skin: Skin is warm and dry. No rash noted. No erythema. No pallor.  0.5 cm x 0.5 cm flesh colored lesion to right posterior shoulder. No redness, warmth, discharge noted.   Psychiatric: She has a normal mood and affect. Her behavior is normal. Judgment and thought content normal.  Nursing note and vitals reviewed.     Assessment & Plan:  1. Neoplasm of uncertain behavior After informed written consent was obtained, using Betadine for cleansing and 1% Lidocaine with epinephrine for anesthetic, with sterile technique a 0.5 cm x 0.5 cm lesion was removed from the right posterior shoulder by shaving . Hemostasis was obtained by pressure and silver nitrate- the wound was not sutured. Antibiotic dressing is applied, and wound care instructions provided. Be alert for any signs of cutaneous infection. The specimen is labeled and sent to pathology for evaluation. The procedure was well tolerated without complications. - Dermatology pathology - Tylenol/Motrin for pain control  - Follow up as needed   Dorothyann Peng, NP

## 2016-03-20 NOTE — Progress Notes (Signed)
Pre visit review using our clinic review tool, if applicable. No additional management support is needed unless otherwise documented below in the visit note. 

## 2016-03-27 ENCOUNTER — Telehealth: Payer: Self-pay | Admitting: Family Medicine

## 2016-03-27 DIAGNOSIS — C4492 Squamous cell carcinoma of skin, unspecified: Secondary | ICD-10-CM

## 2016-03-27 NOTE — Telephone Encounter (Signed)
Pt had a mole removed and would like results

## 2016-03-28 NOTE — Telephone Encounter (Signed)
Lab results showed:  Skin , lesion from right shoulder, 0.5cm x 0.5cm SQUAMOUS CELL CARCINOMA, CYSTIC PATTERN, DEEP MARGIN INVOLVED"   Spoke with Dr. Yong Channel and he confirmed it is skin cancer and patient needs referral to dermatology. Patient is aware of Dr. Ansel Bong interpretation, referral being made, and to look out for their phone call.

## 2016-03-30 ENCOUNTER — Telehealth: Payer: Self-pay | Admitting: Family Medicine

## 2016-03-30 NOTE — Telephone Encounter (Signed)
[facility]/ [facility] would like to know if there was a pathology report done on pt.  Sara Nicholson say it was an urgent request from our office and she is trying to get pt scheduled correctly.   Please call as soon as possible.

## 2016-06-05 ENCOUNTER — Other Ambulatory Visit: Payer: Self-pay | Admitting: Family Medicine

## 2016-06-05 DIAGNOSIS — Z1231 Encounter for screening mammogram for malignant neoplasm of breast: Secondary | ICD-10-CM

## 2016-07-03 ENCOUNTER — Ambulatory Visit
Admission: RE | Admit: 2016-07-03 | Discharge: 2016-07-03 | Disposition: A | Discharge status: Home / Self Care | Payer: Managed Care, Other (non HMO) | Source: Ambulatory Visit | Attending: Family Medicine | Admitting: Family Medicine

## 2016-07-03 DIAGNOSIS — Z1231 Encounter for screening mammogram for malignant neoplasm of breast: Secondary | ICD-10-CM

## 2016-07-06 ENCOUNTER — Other Ambulatory Visit: Payer: Self-pay | Admitting: Family Medicine

## 2016-07-06 DIAGNOSIS — R928 Other abnormal and inconclusive findings on diagnostic imaging of breast: Secondary | ICD-10-CM

## 2016-07-10 ENCOUNTER — Ambulatory Visit
Admission: RE | Admit: 2016-07-10 | Discharge: 2016-07-10 | Disposition: A | Discharge status: Home / Self Care | Payer: Managed Care, Other (non HMO) | Source: Ambulatory Visit | Attending: Family Medicine | Admitting: Family Medicine

## 2016-07-10 DIAGNOSIS — R928 Other abnormal and inconclusive findings on diagnostic imaging of breast: Secondary | ICD-10-CM

## 2017-03-21 ENCOUNTER — Ambulatory Visit (INDEPENDENT_AMBULATORY_CARE_PROVIDER_SITE_OTHER): Payer: 59 | Admitting: Obstetrics and Gynecology

## 2017-03-21 ENCOUNTER — Encounter: Payer: Self-pay | Admitting: Obstetrics and Gynecology

## 2017-03-21 ENCOUNTER — Other Ambulatory Visit (HOSPITAL_COMMUNITY)
Admission: RE | Admit: 2017-03-21 | Discharge: 2017-03-21 | Disposition: A | Discharge status: Home / Self Care | Payer: 59 | Source: Ambulatory Visit | Attending: Obstetrics and Gynecology | Admitting: Obstetrics and Gynecology

## 2017-03-21 VITALS — BP 140/84 | HR 72 | Resp 16 | Ht 63.25 in | Wt 222.6 lb

## 2017-03-21 DIAGNOSIS — R350 Frequency of micturition: Secondary | ICD-10-CM

## 2017-03-21 DIAGNOSIS — Z01419 Encounter for gynecological examination (general) (routine) without abnormal findings: Secondary | ICD-10-CM | POA: Diagnosis not present

## 2017-03-21 DIAGNOSIS — Z124 Encounter for screening for malignant neoplasm of cervix: Secondary | ICD-10-CM | POA: Diagnosis present

## 2017-03-21 DIAGNOSIS — Z0000 Encounter for general adult medical examination without abnormal findings: Secondary | ICD-10-CM

## 2017-03-21 DIAGNOSIS — R102 Pelvic and perineal pain: Secondary | ICD-10-CM | POA: Diagnosis not present

## 2017-03-21 DIAGNOSIS — R109 Unspecified abdominal pain: Secondary | ICD-10-CM

## 2017-03-21 DIAGNOSIS — N3941 Urge incontinence: Secondary | ICD-10-CM | POA: Diagnosis not present

## 2017-03-21 DIAGNOSIS — R87610 Atypical squamous cells of undetermined significance on cytologic smear of cervix (ASC-US): Secondary | ICD-10-CM | POA: Insufficient documentation

## 2017-03-21 NOTE — Patient Instructions (Signed)

## 2017-03-21 NOTE — Progress Notes (Addendum)
61 y.o. G1P1001 MarriedCaucasianF here for annual exam.   A couple of weeks ago she was walking on the treadmill and she started to have pain in her LLQ of her abdomen/pelvis. Feels similar to pre-menstrual cramping. The pain is intermittent, was initially worse with BM, now no pain with BM. The pain that she is getting is down to a 1/10 in severity. It was up to a 5/10 in severity. When she was pushing on her lower abdomen initially very sore when she pushed on her c/s scar on the left. Some lower back discomfort.  No vaginal bleeding. A couple of days ago she had an episode of urge incontinence, this was the first time this has happened. She has some urinary frequency and urgency, comes and goes, not a change. No dysuria. She has a normal BM every day. She had salmonella in the 80's, every since then her bowel have been loose or soft. No blood in her stool, no black tarry stools. No fevers. She is sexually active. Some has some pain with entry. Feels raw right at the entry, then feels fine.     Patient's last menstrual period was 06/08/2010.          Sexually active: Yes.    The current method of family planning is post menopausal status.    Exercising: Yes.    Cardio, Strength Smoker:  no  Health Maintenance: Pap: 12/16/12 Neg; Neg HR HPV History of abnormal Pap:  Yes, 1981-82 - Laser - Dysplasia MM[facility]: 07/03/16 BIRADS0, Density C[facility]; 07/10/16 Dx & U/S L Breast BIRADS2, [facility]  Colonoscopy: 04/06/2008 Normal. Repeat 10 yrs BMD: 2009 Normal per patient TDaP:  03/02/2009 Gardasil: n/a   reports that she has never smoked. She has never used smokeless tobacco. She reports that she does not drink alcohol or use drugs.She works in Therapist, art. One child, daughter, 21. Lives locally  History reviewed. No pertinent past medical history.  Past Surgical History:  Procedure Laterality Date  . APPENDECTOMY    . BREAST SURGERY    . CESAREAN SECTION      No current outpatient  prescriptions on file.   No current facility-administered medications for this visit.     Family History  Problem Relation Age of Onset  . Heart attack Father   . Heart attack Brother   . Cancer Brother        Mass on L5  . Diabetes Maternal Grandmother   . Heart attack Maternal Grandfather   . Heart attack Paternal Grandfather   . Cancer Brother   . Lung cancer Brother   . Breast cancer Other     Review of Systems  Constitutional: Negative.   HENT: Negative.   Eyes: Negative.   Respiratory: Negative.   Cardiovascular: Negative.   Gastrointestinal: Negative.   Endocrine: Negative.   Genitourinary:       LLQ Pain   Musculoskeletal: Negative.   Skin: Negative.   Allergic/Immunologic: Negative.   Neurological: Negative.   Hematological: Negative.   Psychiatric/Behavioral: Negative.     Exam:   BP 140/84 (BP Location: Right Arm, Patient Position: Sitting, Cuff Size: Normal)   Pulse 72   Resp 16   Ht 5' 3.25" (1.607 m)   Wt 222 lb 9.6 oz (101 kg)   LMP 06/08/2010   BMI 39.12 kg/m   Weight change: @WEIGHTCHANGE @ Height:   Height: 5' 3.25" (160.7 cm)  Ht Readings from Last 3 Encounters:  03/21/17 5' 3.25" (1.607 m)  03/20/16 5\' 4"  (1.626 m)  01/05/16 5\' 4"  (1.626 m)    General appearance: alert, cooperative and appears stated age Head: Normocephalic, without obvious abnormality, atraumatic Neck: no adenopathy, supple, symmetrical, trachea midline and thyroid normal to inspection and palpation Lungs: clear to auscultation bilaterally Cardiovascular: regular rate and rhythm Breasts: normal appearance, no masses or tenderness Abdomen: soft, mildly tender LLQ, suprapubic region, no rebound, no guarding, no masses. Not tender with palpation when she tenses her abdomen; bowel sounds normal; no masses,  no organomegaly Extremities: extremities normal, atraumatic, no cyanosis or edema Skin: Skin color, texture, turgor normal. No rashes or lesions Lymph nodes: Cervical,  supraclavicular, and axillary nodes normal. No abnormal inguinal nodes palpated Neurologic: Grossly normal   Pelvic: External genitalia:  no lesions              Urethra:  normal appearing urethra with no masses, tenderness or lesions              Bartholins and Skenes: normal                 Vagina: normal appearing vagina with normal color and discharge, no lesions              Cervix: no lesions               Bimanual Exam:  Uterus:  normal size, contour, position, consistency, mobility, non-tender and anteverted              Adnexa: no mass, fullness, tenderness               Rectovaginal: Confirms               Anus:  normal sphincter tone, no lesions  Chaperone was present for exam.  A:  Well Woman with normal exam  LLQ abdominal/pelvic pain, improving with time  Urinary frequency and urgency, recommended she cut back on caffeine  Urge incontinence x 1   P:   Pap with hpv  Screening labs  Discussed breast self exam  Discussed calcium and vit D intake  Send urine for ua, c&s  Stool guiac negative  If her pain doesn't completely resolve would refer her to her primary MD   Due for a colonoscopy next year   Addendum: if her pain persists and primary doesn't think it is GI, would do a pelvic ultrasound. Recommended she cut back on caffeine, if her urinary frequency doesn't improve, would consider medication.

## 2017-03-22 LAB — COMPREHENSIVE METABOLIC PANEL
A/G RATIO: 1.7 (ref 1.2–2.2)
ALT: 15 IU/L (ref 0–32)
AST: 17 IU/L (ref 0–40)
Albumin: 4.5 g/dL (ref 3.6–4.8)
Alkaline Phosphatase: 100 IU/L (ref 39–117)
BILIRUBIN TOTAL: 0.3 mg/dL (ref 0.0–1.2)
BUN/Creatinine Ratio: 17 (ref 12–28)
BUN: 14 mg/dL (ref 8–27)
CALCIUM: 9.3 mg/dL (ref 8.7–10.3)
CHLORIDE: 105 mmol/L (ref 96–106)
CO2: 23 mmol/L (ref 20–29)
Creatinine, Ser: 0.83 mg/dL (ref 0.57–1.00)
GFR calc non Af Amer: 76 mL/min/{1.73_m2} (ref >59)
GFR, EST AFRICAN AMERICAN: 88 mL/min/{1.73_m2} (ref >59)
GLOBULIN, TOTAL: 2.7 g/dL (ref 1.5–4.5)
Glucose: 92 mg/dL (ref 65–99)
POTASSIUM: 4.3 mmol/L (ref 3.5–5.2)
SODIUM: 141 mmol/L (ref 134–144)
TOTAL PROTEIN: 7.2 g/dL (ref 6.0–8.5)

## 2017-03-22 LAB — URINALYSIS, MICROSCOPIC ONLY
Bacteria, UA: NONE SEEN
Casts: NONE SEEN /lpf
EPITHELIAL CELLS (NON RENAL): NONE SEEN /HPF (ref 0–10)
RBC MICROSCOPIC, UA: NONE SEEN /HPF (ref 0–?)
WBC UA: NONE SEEN /HPF (ref 0–?)

## 2017-03-22 LAB — CBC
HEMATOCRIT: 39.4 % (ref 34.0–46.6)
Hemoglobin: 13 g/dL (ref 11.1–15.9)
MCH: 30 pg (ref 26.6–33.0)
MCHC: 33 g/dL (ref 31.5–35.7)
MCV: 91 fL (ref 79–97)
Platelets: 255 10*3/uL (ref 150–379)
RBC: 4.34 x10E6/uL (ref 3.77–5.28)
RDW: 14.6 % (ref 12.3–15.4)
WBC: 6 10*3/uL (ref 3.4–10.8)

## 2017-03-22 LAB — LIPID PANEL
Chol/HDL Ratio: 4.2 ratio (ref 0.0–4.4)
Cholesterol, Total: 222 mg/dL — ABNORMAL HIGH (ref 100–199)
HDL: 53 mg/dL (ref >39)
LDL Calculated: 148 mg/dL — ABNORMAL HIGH (ref 0–99)
Triglycerides: 105 mg/dL (ref 0–149)
VLDL Cholesterol Cal: 21 mg/dL (ref 5–40)

## 2017-03-22 LAB — URINE CULTURE

## 2017-03-28 LAB — CYTOLOGY - PAP
Diagnosis: UNDETERMINED — AB
HPV: NOT DETECTED

## 2017-06-28 ENCOUNTER — Encounter: Payer: Self-pay | Admitting: Family Medicine

## 2017-06-28 ENCOUNTER — Other Ambulatory Visit: Payer: Self-pay | Admitting: Family Medicine

## 2017-06-28 DIAGNOSIS — Z1231 Encounter for screening mammogram for malignant neoplasm of breast: Secondary | ICD-10-CM

## 2017-07-10 ENCOUNTER — Ambulatory Visit
Admission: RE | Admit: 2017-07-10 | Discharge: 2017-07-10 | Disposition: A | Discharge status: Home / Self Care | Payer: 59 | Source: Ambulatory Visit | Attending: Family Medicine | Admitting: Family Medicine

## 2017-07-10 DIAGNOSIS — Z1231 Encounter for screening mammogram for malignant neoplasm of breast: Secondary | ICD-10-CM

## 2018-03-21 ENCOUNTER — Ambulatory Visit (INDEPENDENT_AMBULATORY_CARE_PROVIDER_SITE_OTHER): Payer: 59 | Admitting: Family Medicine

## 2018-03-21 ENCOUNTER — Encounter: Payer: Self-pay | Admitting: Family Medicine

## 2018-03-21 VITALS — BP 132/80 | HR 73 | Temp 98.3°F | Ht 63.25 in | Wt 212.1 lb

## 2018-03-21 DIAGNOSIS — Z23 Encounter for immunization: Secondary | ICD-10-CM

## 2018-03-21 DIAGNOSIS — S50811A Abrasion of right forearm, initial encounter: Secondary | ICD-10-CM

## 2018-03-21 DIAGNOSIS — W5503XA Scratched by cat, initial encounter: Secondary | ICD-10-CM | POA: Diagnosis not present

## 2018-03-21 MED ORDER — MUPIROCIN 2 % EX OINT: TOPICAL_OINTMENT | CUTANEOUS | 0 refills | Status: DC

## 2018-03-21 NOTE — Patient Instructions (Signed)
Cat-Scratch Disease Cat-scratch disease is a rare infection that can be passed to people through the scratch or bite of an infected cat. The infection causes a red bump at the site of the bite or scratch. It may also cause swollen lymph glands and other symptoms. In most cases, the infection is mild and does not cause serious problems. However, a more severe infection can develop in people who have other illnesses or problems that weaken their body's defense system (immune system). What are the causes? This condition is caused by a type of bacteria called Bartonella henselae. These bacteria are present in the mouth or on the claws of cats. What are the signs or symptoms? Common symptoms of this condition include:  A red and sore pimple or bump-with or without pus-on the skin where the cat scratched or bit. The pimple or sore may be present for as long as 3 weeks after the scratch or bite occurred.  One or more enlarged lymph glands located toward the center of the body from where the injury occurred.  Other symptoms include:  Low-grade fever.  Tiredness and fatigue.  Headache.  Sore throat.  How is this diagnosed? This condition may be diagnosed based on your symptoms and history of a scratch or bite from a cat. Your health care provider will examine the skin sore and look for swollen lymph glands. You may also have tests, such as:  Culture tests of any fluid or pus from the injury site.  Blood tests.  Removal of a tissue sample from a swollen lymph gland (biopsy) to be looked at under a microscope. This may be done to confirm the diagnosis and to make sure that a different infection or disease is not causing your illness.  How is this treated? If the condition is mild, treatment may not be needed. You may be advised to take pain medicine and apply heat to the affected area. A more severe infection can be treated with antibiotic medicine. People who have immune system problems will  usually be treated with antibiotics because they are at risk for developing a severe infection. These include people who have HIV or AIDS, people who take medicines that may modify their immune system, and people who have had an organ transplant. Follow these instructions at home:  Rest until you feel better.  Take over-the-counter and prescription medicines only as told by your health care provider.  If you were prescribed an antibiotic medicine, take it as told by your health care provider. Do not stop taking the antibiotic even if you start to feel better.  Keep the area of the cat scratch clean. Wash it with soap and water, or apply an antiseptic solution such as povidone-iodine.  If directed, apply heat to the scratch area: ? Put a towel between your skin and a heat pack or heating pad. ? Leave the heat on for 20-30 minutes or as told by your health care provider. How is this prevented?  Avoid injury while playing with cats.  Wash well after playing with cats.  Do not let your cat lick sores on your body.  Do not let your cat roam around outside of your house. Contact a health care provider if:  You have increased redness, swelling, or pain at the site of the scratch.  You have fluid, blood, or pus coming from the scratch area.  You have a fever. Get help right away if:  You have increased swelling of your lymph glands.  You   develop pain in your abdomen.  You develop a skin rash.  You feel dizzy or you pass out.  You have a worsening headache.  You develop inflammation of your eye or have increasing vision problems.  You have pain in one of your bones.  You develop a stiff neck. This information is not intended to replace advice given to you by your health care provider. Make sure you discuss any questions you have with your health care provider. Document Released: 09/21/2000 Document Revised: 03/01/2016 Document Reviewed: 12/28/2014 Elsevier Interactive Patient  Education  2018 [place] Disease Cat-scratch disease is a rare infection that can be passed to people through the scratch or bite of an infected cat. The infection causes a red bump at the site of the bite or scratch. It may also cause swollen lymph glands and other symptoms. In most cases, the infection is mild and does not cause serious problems. However, a more severe infection can develop in people who have other illnesses or problems that weaken their body's defense system (immune system). What are the causes? This condition is caused by a type of bacteria called Bartonella henselae. These bacteria are present in the mouth or on the claws of cats. What are the signs or symptoms? Common symptoms of this condition include:  A red and sore pimple or bump-with or without pus-on the skin where the cat scratched or bit. The pimple or sore may be present for as long as 3 weeks after the scratch or bite occurred.  One or more enlarged lymph glands located toward the center of the body from where the injury occurred.  Other symptoms include:  Low-grade fever.  Tiredness and fatigue.  Headache.  Sore throat.  How is this diagnosed? This condition may be diagnosed based on your symptoms and history of a scratch or bite from a cat. Your health care provider will examine the skin sore and look for swollen lymph glands. You may also have tests, such as:  Culture tests of any fluid or pus from the injury site.  Blood tests.  Removal of a tissue sample from a swollen lymph gland (biopsy) to be looked at under a microscope. This may be done to confirm the diagnosis and to make sure that a different infection or disease is not causing your illness.  How is this treated? If the condition is mild, treatment may not be needed. You may be advised to take pain medicine and apply heat to the affected area. A more severe infection can be treated with antibiotic medicine. People who  have immune system problems will usually be treated with antibiotics because they are at risk for developing a severe infection. These include people who have HIV or AIDS, people who take medicines that may modify their immune system, and people who have had an organ transplant. Follow these instructions at home:  Rest until you feel better.  Take over-the-counter and prescription medicines only as told by your health care provider.  If you were prescribed an antibiotic medicine, take it as told by your health care provider. Do not stop taking the antibiotic even if you start to feel better.  Keep the area of the cat scratch clean. Wash it with soap and water, or apply an antiseptic solution such as povidone-iodine.  If directed, apply heat to the scratch area: ? Put a towel between your skin and a heat pack or heating pad. ? Leave the heat on for 20-30 minutes or as told  by your health care provider. How is this prevented?  Avoid injury while playing with cats.  Wash well after playing with cats.  Do not let your cat lick sores on your body.  Do not let your cat roam around outside of your house. Contact a health care provider if:  You have increased redness, swelling, or pain at the site of the scratch.  You have fluid, blood, or pus coming from the scratch area.  You have a fever. Get help right away if:  You have increased swelling of your lymph glands.  You develop pain in your abdomen.  You develop a skin rash.  You feel dizzy or you pass out.  You have a worsening headache.  You develop inflammation of your eye or have increasing vision problems.  You have pain in one of your bones.  You develop a stiff neck. This information is not intended to replace advice given to you by your health care provider. Make sure you discuss any questions you have with your health care provider. Document Released: 09/21/2000 Document Revised: 03/01/2016 Document Reviewed:  12/28/2014 Elsevier Interactive Patient Education  [employer].

## 2018-03-21 NOTE — Progress Notes (Signed)
Subjective:  Patient ID: Sara Nicholson, female    DOB: 10/19/1955  Age: 62 y.o. MRN: [id number]  CC: cat scratch   HPI [name] presents for evaluation of a cat scratch on her right arm that happened 3 days ago.  There is been no headache fever streaking or discharge from the wounds.  They have had this cat for the last 8 years.  He does go outside.  He was rescued as a kitten and has not been a friendly cat today.  He is having some trouble moving his hind legs and she was helping him up the stairs when suddenly he has high clogs dog into her right arm.  Last TD was in 2010.  No outpatient medications prior to visit.   No facility-administered medications prior to visit.     ROS Review of Systems  Constitutional: Negative for chills, fatigue, fever and unexpected weight change.  Respiratory: Negative.   Cardiovascular: Negative.   Gastrointestinal: Negative.   Skin: Positive for color change and wound. Negative for pallor and rash.  Neurological: Negative for headaches.  Hematological: Negative for adenopathy.  Psychiatric/Behavioral: Negative.     Objective:  BP 132/80   Pulse 73   Temp 98.3 F (36.8 C)   Ht 5' 3.25" (1.607 m)   Wt 212 lb 2 oz (96.2 kg)   LMP 06/08/2010   SpO2 97%   BMI 37.28 kg/m   BP Readings from Last 3 Encounters:  03/21/18 132/80  03/21/17 140/84  03/20/16 130/80    Wt Readings from Last 3 Encounters:  03/21/18 212 lb 2 oz (96.2 kg)  03/21/17 222 lb 9.6 oz (101 kg)  03/20/16 203 lb (92.1 kg)    Physical Exam  Constitutional: She is oriented to person, place, and time. She appears well-developed and well-nourished. No distress.  HENT:  Head: Normocephalic and atraumatic.  Right Ear: External ear normal.  Left Ear: External ear normal.  Eyes: Right eye exhibits no discharge. Left eye exhibits no discharge. No scleral icterus.  Pulmonary/Chest: Effort normal.  Neurological: She is alert and oriented to person, place, and time.    Skin: Skin is warm and dry. Capillary refill takes less than 2 seconds. She is not diaphoretic.     Psychiatric: She has a normal mood and affect. Her behavior is normal.    Lab Results  Component Value Date   WBC 6.0 03/21/2017   HGB 13.0 03/21/2017   HCT 39.4 03/21/2017   PLT 255 03/21/2017   GLUCOSE 92 03/21/2017   CHOL 222 (H) 03/21/2017   TRIG 105 03/21/2017   HDL 53 03/21/2017   LDLDIRECT 158.0 02/23/2009   LDLCALC 148 (H) 03/21/2017   ALT 15 03/21/2017   AST 17 03/21/2017   NA 141 03/21/2017   K 4.3 03/21/2017   CL 105 03/21/2017   CREATININE 0.83 03/21/2017   BUN 14 03/21/2017   CO2 23 03/21/2017   TSH 0.99 12/08/2013    Mm Screening Breast Tomo Bilateral  Result Date: 07/11/2017 CLINICAL DATA:  Screening. EXAM: 2D DIGITAL SCREENING BILATERAL MAMMOGRAM WITH CAD AND ADJUNCT TOMO COMPARISON:  Previous exam(s). ACR Breast Density Category c: The breast tissue is heterogeneously dense, which may obscure small masses. FINDINGS: There are no findings suspicious for malignancy. Images were processed with CAD. IMPRESSION: No mammographic evidence of malignancy. A result letter of this screening mammogram will be mailed directly to the patient. RECOMMENDATION: Screening mammogram in one year. (Code:SM-B-01Y) BI-RADS CATEGORY  1: Negative. Electronically  Signed   By: Ammie Ferrier M.D.   On: 07/11/2017 14:10    Assessment & Plan:   Porshea was seen today for cat scratch.  Diagnoses and all orders for this visit:  Cat scratch -     mupirocin ointment (BACTROBAN) 2 %; Apply to wounds twice each day for 1 week. -     Cancel: DTaP HepB IPV combined vaccine IM -     Tdap vaccine greater than or equal to 7yo IM   I am having Mariann Laster E. Scronce start on mupirocin ointment.  Meds ordered this encounter  Medications  . mupirocin ointment (BACTROBAN) 2 %    Sig: Apply to wounds twice each day for 1 week.    Dispense:  22 g    Refill:  0   Patient was given information on  cat scratch fever follow-up PRN.  Follow-up: Return if symptoms worsen or fail to improve.  Libby Maw, MD

## 2018-03-27 ENCOUNTER — Encounter: Payer: Self-pay | Admitting: Obstetrics and Gynecology

## 2018-03-27 ENCOUNTER — Ambulatory Visit (INDEPENDENT_AMBULATORY_CARE_PROVIDER_SITE_OTHER): Payer: 59 | Admitting: Obstetrics and Gynecology

## 2018-03-27 ENCOUNTER — Encounter: Payer: Self-pay | Admitting: Gastroenterology

## 2018-03-27 ENCOUNTER — Other Ambulatory Visit: Payer: Self-pay

## 2018-03-27 VITALS — BP 152/84 | HR 64 | Resp 16 | Ht 63.5 in | Wt 213.0 lb

## 2018-03-27 DIAGNOSIS — Z01419 Encounter for gynecological examination (general) (routine) without abnormal findings: Secondary | ICD-10-CM

## 2018-03-27 DIAGNOSIS — Z1211 Encounter for screening for malignant neoplasm of colon: Secondary | ICD-10-CM

## 2018-03-27 NOTE — Patient Instructions (Signed)
EXERCISE AND DIET:  We recommended that you start or continue a regular exercise program for good health. Regular exercise means any activity that makes your heart beat faster and makes you sweat.  We recommend exercising at least 30 minutes per day at least 3 days a week, preferably 4 or 5.  We also recommend a diet low in fat and sugar.  Inactivity, poor dietary choices and obesity can cause diabetes, heart attack, stroke, and kidney damage, among others.    ALCOHOL AND SMOKING:  Women should limit their alcohol intake to no more than 7 drinks/beers/glasses of wine (combined, not each!) per week. Moderation of alcohol intake to this level decreases your risk of breast cancer and liver damage. And of course, no recreational drugs are part of a healthy lifestyle.  And absolutely no smoking or even second hand smoke. Most people know smoking can cause heart and lung diseases, but did you know it also contributes to weakening of your bones? Aging of your skin?  Yellowing of your teeth and nails?  CALCIUM AND VITAMIN D:  Adequate intake of calcium and Vitamin D are recommended.  The recommendations for exact amounts of these supplements seem to change often, but generally speaking 600 mg of calcium (either carbonate or citrate) and 800 units of Vitamin D per day seems prudent. Certain women may benefit from higher intake of Vitamin D.  If you are among these women, your doctor will have told you during your visit.    PAP SMEARS:  Pap smears, to check for cervical cancer or precancers,  have traditionally been done yearly, although recent scientific advances have shown that most women can have pap smears less often.  However, every woman still should have a physical exam from her gynecologist every year. It will include a breast check, inspection of the vulva and vagina to check for abnormal growths or skin changes, a visual exam of the cervix, and then an exam to evaluate the size and shape of the uterus and  ovaries.  And after 62 years of age, a rectal exam is indicated to check for rectal cancers. We will also provide age appropriate advice regarding health maintenance, like when you should have certain vaccines, screening for sexually transmitted diseases, bone density testing, colonoscopy, mammograms, etc.   MAMMOGRAMS:  All women over 40 years old should have a yearly mammogram. Many facilities now offer a "3D" mammogram, which may cost around $50 extra out of pocket. If possible,  we recommend you accept the option to have the 3D mammogram performed.  It both reduces the number of women who will be called back for extra views which then turn out to be normal, and it is better than the routine mammogram at detecting truly abnormal areas.    COLONOSCOPY:  Colonoscopy to screen for colon cancer is recommended for all women at age 50.  We know, you hate the idea of the prep.  We agree, BUT, having colon cancer and not knowing it is worse!!  Colon cancer so often starts as a polyp that can be seen and removed at colonscopy, which can quite literally save your life!  And if your first colonoscopy is normal and you have no family history of colon cancer, most women don't have to have it again for 10 years.  Once every ten years, you can do something that may end up saving your life, right?  We will be happy to help you get it scheduled when you are ready.    Be sure to check your insurance coverage so you understand how much it will cost.  It may be covered as a preventative service at no cost, but you should check your particular policy.      Breast Self-Awareness Breast self-awareness means being familiar with how your breasts look and feel. It involves checking your breasts regularly and reporting any changes to your health care provider. Practicing breast self-awareness is important. A change in your breasts can be a sign of a serious medical problem. Being familiar with how your breasts look and feel allows  you to find any problems early, when treatment is more likely to be successful. All women should practice breast self-awareness, including women who have had breast implants. How to do a breast self-exam One way to learn what is normal for your breasts and whether your breasts are changing is to do a breast self-exam. To do a breast self-exam: Look for Changes  1. Remove all the clothing above your waist. 2. Stand in front of a mirror in a room with good lighting. 3. Put your hands on your hips. 4. Push your hands firmly downward. 5. Compare your breasts in the mirror. Look for differences between them (asymmetry), such as: ? Differences in shape. ? Differences in size. ? Puckers, dips, and bumps in one breast and not the other. 6. Look at each breast for changes in your skin, such as: ? Redness. ? Scaly areas. 7. Look for changes in your nipples, such as: ? Discharge. ? Bleeding. ? Dimpling. ? Redness. ? A change in position. Feel for Changes  Carefully feel your breasts for lumps and changes. It is best to do this while lying on your back on the floor and again while sitting or standing in the shower or tub with soapy water on your skin. Feel each breast in the following way:  Place the arm on the side of the breast you are examining above your head.  Feel your breast with the other hand.  Start in the nipple area and make  inch (2 cm) overlapping circles to feel your breast. Use the pads of your three middle fingers to do this. Apply light pressure, then medium pressure, then firm pressure. The light pressure will allow you to feel the tissue closest to the skin. The medium pressure will allow you to feel the tissue that is a little deeper. The firm pressure will allow you to feel the tissue close to the ribs.  Continue the overlapping circles, moving downward over the breast until you feel your ribs below your breast.  Move one finger-width toward the center of the body.  Continue to use the  inch (2 cm) overlapping circles to feel your breast as you move slowly up toward your collarbone.  Continue the up and down exam using all three pressures until you reach your armpit.  Write Down What You Find  Write down what is normal for each breast and any changes that you find. Keep a written record with breast changes or normal findings for each breast. By writing this information down, you do not need to depend only on memory for size, tenderness, or location. Write down where you are in your menstrual cycle, if you are still menstruating. If you are having trouble noticing differences in your breasts, do not get discouraged. With time you will become more familiar with the variations in your breasts and more comfortable with the exam. How often should I examine my breasts? Examine   your breasts every month. If you are breastfeeding, the best time to examine your breasts is after a feeding or after using a breast pump. If you menstruate, the best time to examine your breasts is 5-7 days after your period is over. During your period, your breasts are lumpier, and it may be more difficult to notice changes. When should I see my health care provider? See your health care provider if you notice:  A change in shape or size of your breasts or nipples.  A change in the skin of your breast or nipples, such as a reddened or scaly area.  Unusual discharge from your nipples.  A lump or thick area that was not there before.  Pain in your breasts.  Anything that concerns you.  This information is not intended to replace advice given to you by your health care provider. Make sure you discuss any questions you have with your health care provider. Document Released: 09/24/2005 Document Revised: 03/01/2016 Document Reviewed: 08/14/2015 Elsevier Interactive Patient Education  2018 [employer]  

## 2018-03-27 NOTE — Progress Notes (Signed)
62 y.o. G1P1001 MarriedCaucasianF here for annual exam. No vaginal bleeding. Occasional dyspareunia, uses a lubricant which helps.      Patient's last menstrual period was 06/08/2010.          Sexually active: Yes.    The current method of family planning is post menopausal status.    Exercising: Yes.    Zumba/body combat  Smoker:  no  Health Maintenance: Pap:  03-21-17 ASCUS NEG HR HPV 12-16-12 WNL NEG HR HPV  History of abnormal Pap:  Yes, had laser surgery in the early 80's. MMG:  07-10-17 WNL  Colonoscopy:  04-06-08 normal  BMD:  2009 normal per patient  TDaP:  03-08-18 Gardasil: N/A   reports that she has never smoked. She has never used smokeless tobacco. She reports that she does not drink alcohol or use drugs. She works in Therapist, art. One child, daughter, 46. Lives locally   History reviewed. No pertinent past medical history.  Past Surgical History:  Procedure Laterality Date  . APPENDECTOMY    . BREAST SURGERY    . CESAREAN SECTION      No current outpatient medications on file.   No current facility-administered medications for this visit.     Family History  Problem Relation Age of Onset  . Heart attack Father   . Heart attack Brother   . Cancer Brother        Mass on L5  . Diabetes Maternal Grandmother   . Heart attack Maternal Grandfather   . Heart attack Paternal Grandfather   . Cancer Brother   . Lung cancer Brother   . Breast cancer Other     Review of Systems  Constitutional: Negative.   HENT: Negative.   Eyes: Negative.   Respiratory: Negative.   Cardiovascular: Negative.   Gastrointestinal: Negative.   Endocrine: Negative.   Genitourinary: Negative.   Musculoskeletal: Negative.   Skin: Negative.   Allergic/Immunologic: Negative.   Neurological: Negative.   Psychiatric/Behavioral: Negative.     Exam:   BP (!) 142/78 (BP Location: Right Arm, Patient Position: Sitting, Cuff Size: Normal)   Pulse 64   Resp 16   Ht 5' 3.5" (1.613 m)    Wt 213 lb (96.6 kg)   LMP 06/08/2010   BMI 37.14 kg/m   Weight change: @WEIGHTCHANGE @ Height:   Height: 5' 3.5" (161.3 cm)  Ht Readings from Last 3 Encounters:  03/27/18 5' 3.5" (1.613 m)  03/21/18 5' 3.25" (1.607 m)  03/21/17 5' 3.25" (1.607 m)    General appearance: alert, cooperative and appears stated age Head: Normocephalic, without obvious abnormality, atraumatic Neck: no adenopathy, supple, symmetrical, trachea midline and thyroid normal to inspection and palpation Lungs: clear to auscultation bilaterally Cardiovascular: regular rate and rhythm Breasts: normal appearance, no masses or tenderness Abdomen: soft, non-tender; non distended,  no masses,  no organomegaly Extremities: extremities normal, atraumatic, no cyanosis or edema Skin: Skin color, texture, turgor normal. No rashes or lesions Lymph nodes: Cervical, supraclavicular, and axillary nodes normal. No abnormal inguinal nodes palpated Neurologic: Grossly normal   Pelvic: External genitalia:  no lesions              Urethra:  normal appearing urethra with no masses, tenderness or lesions              Bartholins and Skenes: normal                 Vagina: normal appearing vagina with normal color and discharge, no lesions  Cervix: no lesions               Bimanual Exam:  Uterus:  normal size, contour, position, consistency, mobility, non-tender              Adnexa: no mass, fullness, tenderness               Rectovaginal: Confirms               Anus:  normal sphincter tone, no lesions  Chaperone was present for exam.  A:  Well Woman with normal exam  Overweight, has lost 10 lbs this year  Elevated BP, if still high on repeat will have her f/u with her primary  P:   No pap this year  Labs with work, will send a copy  Mammogram in the fall   Set up colonoscopy  Discussed breast self exam  Discussed calcium and vit D intake

## 2018-05-23 ENCOUNTER — Ambulatory Visit (AMBULATORY_SURGERY_CENTER): Payer: Self-pay

## 2018-05-23 VITALS — Ht 64.0 in | Wt 216.6 lb

## 2018-05-23 DIAGNOSIS — Z1211 Encounter for screening for malignant neoplasm of colon: Secondary | ICD-10-CM

## 2018-05-23 MED ORDER — NA SULFATE-K SULFATE-MG SULF 17.5-3.13-1.6 GM/177ML PO SOLN: 1.0000 | Freq: Once | ORAL | 0 refills | Status: AC

## 2018-05-23 NOTE — Progress Notes (Signed)
No egg or soy allergy known to patient  No issues with past sedation with any surgeries  or procedures, no intubation problems  No diet pills per patient No home 02 use per patient  No blood thinners per patient  Pt denies issues with constipation  No A fib or A flutter  EMMI video sent to pt's e mail. Pt denies Allergy contraindication to Plenvu. Will order Suprep.

## 2018-05-26 ENCOUNTER — Encounter: Payer: Self-pay | Admitting: Gastroenterology

## 2018-06-02 ENCOUNTER — Ambulatory Visit (AMBULATORY_SURGERY_CENTER): Payer: 59 | Admitting: Gastroenterology

## 2018-06-02 ENCOUNTER — Encounter: Payer: Self-pay | Admitting: Gastroenterology

## 2018-06-02 VITALS — BP 147/68 | HR 59 | Temp 98.4°F | Resp 18 | Ht 63.0 in | Wt 213.0 lb

## 2018-06-02 DIAGNOSIS — Z1211 Encounter for screening for malignant neoplasm of colon: Secondary | ICD-10-CM | POA: Diagnosis not present

## 2018-06-02 MED ORDER — SODIUM CHLORIDE 0.9 % IV SOLN: 500.0000 mL | Freq: Once | INTRAVENOUS | Status: DC

## 2018-06-02 NOTE — Progress Notes (Signed)
A and O x3. Report to RN. Tolerated MAC anesthesia well.

## 2018-06-02 NOTE — Op Note (Addendum)
[facility] Patient Name: Kaiyana Bedore Procedure Date: 06/02/2018 8:33 AM MRN: [id number] Endoscopist: Mallie Mussel L. Loletha Carrow , MD Age: 62 Referring MD:  Date of Birth: [birth year] Gender: Female Account #: [id number] Procedure:                Colonoscopy Indications:              Screening for colorectal malignant neoplasm (no                            polyps 03/2008) Medicines:                Monitored Anesthesia Care Procedure:                Pre-Anesthesia Assessment:                           - Prior to the procedure, a History and Physical                            was performed, and patient medications and                            allergies were reviewed. The patient's tolerance of                            previous anesthesia was also reviewed. The risks                            and benefits of the procedure and the sedation                            options and risks were discussed with the patient.                            All questions were answered, and informed consent                            was obtained. Prior Anticoagulants: The patient has                            taken no previous anticoagulant or antiplatelet                            agents. ASA Grade Assessment: II - A patient with                            mild systemic disease. After reviewing the risks                            and benefits, the patient was deemed in                            satisfactory condition to undergo the procedure.  After obtaining informed consent, the colonoscope                            was passed under direct vision. Throughout the                            procedure, the patient's blood pressure, pulse, and                            oxygen saturations were monitored continuously. The                            Model [id number]-[id number]) scope was introduced                            through the anus and advanced to the the cecum,                             identified by appendiceal orifice and ileocecal                            valve. The colonoscopy was performed without                            difficulty. The patient tolerated the procedure                            well. The quality of the bowel preparation was                            excellent. The ileocecal valve, appendiceal                            orifice, and rectum were photographed. The quality                            of the bowel preparation was evaluated using the                            BBPS [facility] Bowel Preparation Scale) with scores                            of: Right Colon = 2, Transverse Colon = 3 and Left                            Colon = 3. The total BBPS score equals 8. Scope In: 8:43:51 AM Scope Out: 8:53:47 AM Scope Withdrawal Time: 0 hours 7 minutes 9 seconds  Total Procedure Duration: 0 hours 9 minutes 56 seconds  Findings:                 The perianal and digital rectal examinations were                            normal.  Multiple diverticula were found in the left colon.                           The exam was otherwise without abnormality on                            direct and retroflexion views. Complications:            No immediate complications. Estimated Blood Loss:     Estimated blood loss: none. Impression:               - Diverticulosis in the left colon.                           - The examination was otherwise normal on direct                            and retroflexion views.                           - No specimens collected. Recommendation:           - Patient has a contact number available for                            emergencies. The signs and symptoms of potential                            delayed complications were discussed with the                            patient. Return to normal activities tomorrow.                            Written discharge instructions were  provided to the                            patient.                           - Resume previous diet.                           - Continue present medications.                           - Repeat colonoscopy in 10 years for screening                            purposes. Makaelyn Aponte L. Loletha Carrow, MD 06/02/2018 8:56:52 AM This report has been signed electronically.

## 2018-06-02 NOTE — Patient Instructions (Signed)
YOU HAD AN ENDOSCOPIC PROCEDURE TODAY AT [facility]:   Refer to the procedure report that was given to you for any specific questions about what was found during the examination.  If the procedure report does not answer your questions, please call your gastroenterologist to clarify.  If you requested that your care partner not be given the details of your procedure findings, then the procedure report has been included in a sealed envelope for you to review at your convenience later.  YOU SHOULD EXPECT: Some feelings of bloating in the abdomen. Passage of more gas than usual.  Walking can help get rid of the air that was put into your GI tract during the procedure and reduce the bloating. If you had a lower endoscopy (such as a colonoscopy or flexible sigmoidoscopy) you may notice spotting of blood in your stool or on the toilet paper. If you underwent a bowel prep for your procedure, you may not have a normal bowel movement for a few days.  Please Note:  You might notice some irritation and congestion in your nose or some drainage.  This is from the oxygen used during your procedure.  There is no need for concern and it should clear up in a day or so.  SYMPTOMS TO REPORT IMMEDIATELY:   Following lower endoscopy (colonoscopy or flexible sigmoidoscopy):  Excessive amounts of blood in the stool  Significant tenderness or worsening of abdominal pains  Swelling of the abdomen that is new, acute  Fever of 100F or higher  For urgent or emergent issues, a gastroenterologist can be reached at any hour by calling [phone].   DIET:  We do recommend a small meal at first, but then you may proceed to your regular diet.  Drink plenty of fluids but you should avoid alcoholic beverages for 24 hours.  ACTIVITY:  You should plan to take it easy for the rest of today and you should NOT DRIVE or use heavy machinery until tomorrow (because of the sedation medicines used during the test).     FOLLOW UP: Our staff will call the number listed on your records the next business day following your procedure to check on you and address any questions or concerns that you may have regarding the information given to you following your procedure. If we do not reach you, we will leave a message.  However, if you are feeling well and you are not experiencing any problems, there is no need to return our call.  We will assume that you have returned to your regular daily activities without incident.  If any biopsies were taken you will be contacted by phone or by letter within the next 1-3 weeks.  Please call us at [phone] if you have not heard about the biopsies in 3 weeks.   Repeat next screening colonoscopy in 3 years Diverticulosis (handout given)  SIGNATURES/CONFIDENTIALITY: You and/or your care partner have signed paperwork which will be entered into your electronic medical record.  These signatures attest to the fact that that the information above on your After Visit Summary has been reviewed and is understood.  Full responsibility of the confidentiality of this discharge information lies with you and/or your care-partner.

## 2018-06-03 ENCOUNTER — Telehealth: Payer: Self-pay | Admitting: *Deleted

## 2018-06-03 NOTE — Telephone Encounter (Signed)
  Follow up Call-  Call back number 06/02/2018  Post procedure Call Back phone  # [phone]  Some recent data might be hidden     Patient questions:  Do you have a fever, pain , or abdominal swelling? No. Pain Score  0 *  Have you tolerated food without any problems? Yes.    Have you been able to return to your normal activities? Yes.    Do you have any questions about your discharge instructions: Diet   No. Medications  No. Follow up visit  No.  Do you have questions or concerns about your Care? No.  Actions: * If pain score is 4 or above: No action needed, pain <4.

## 2018-08-12 ENCOUNTER — Other Ambulatory Visit: Payer: Self-pay | Admitting: Obstetrics and Gynecology

## 2018-08-12 DIAGNOSIS — Z1231 Encounter for screening mammogram for malignant neoplasm of breast: Secondary | ICD-10-CM

## 2018-09-24 ENCOUNTER — Ambulatory Visit
Admission: RE | Admit: 2018-09-24 | Discharge: 2018-09-24 | Disposition: A | Discharge status: Home / Self Care | Payer: 59 | Source: Ambulatory Visit | Attending: Obstetrics and Gynecology | Admitting: Obstetrics and Gynecology

## 2018-09-24 DIAGNOSIS — Z1231 Encounter for screening mammogram for malignant neoplasm of breast: Secondary | ICD-10-CM

## 2018-09-25 ENCOUNTER — Other Ambulatory Visit: Payer: Self-pay | Admitting: Obstetrics and Gynecology

## 2018-09-25 DIAGNOSIS — R928 Other abnormal and inconclusive findings on diagnostic imaging of breast: Secondary | ICD-10-CM

## 2018-09-30 ENCOUNTER — Ambulatory Visit
Admission: RE | Admit: 2018-09-30 | Discharge: 2018-09-30 | Disposition: A | Discharge status: Home / Self Care | Payer: 59 | Source: Ambulatory Visit | Attending: Obstetrics and Gynecology | Admitting: Obstetrics and Gynecology

## 2018-09-30 ENCOUNTER — Other Ambulatory Visit: Payer: Self-pay | Admitting: Obstetrics and Gynecology

## 2018-09-30 DIAGNOSIS — N63 Unspecified lump in unspecified breast: Secondary | ICD-10-CM

## 2018-09-30 DIAGNOSIS — R928 Other abnormal and inconclusive findings on diagnostic imaging of breast: Secondary | ICD-10-CM

## 2019-03-31 NOTE — Progress Notes (Signed)
63 y.o. G34P1001 Married White or Caucasian Not Hispanic or Latino female here for annual exam. She is sexually active, no pain with lubrication. No vaginal bleeding.    She has lost almost 30 lbs since January, doing weight watchers. Walking some, doing a body combat class some.     Patient's last menstrual period was 06/08/2010.          Sexually active: Yes.    The current method of family planning is post menopausal status.    Exercising: Yes.    online workout class, walking Smoker:  no  Health Maintenance: Pap:  03-21-17 ASCUS NEG HR HPV, 12-16-12 WNL NEG HR HPV  History of abnormal Pap:  Yes, had laser surgery in the early 80's. MMG:  04/02/2019 Birads 3 Bilateral diagnostic mammography in 6 months with possible left breast ultrasound. F/U bilateral in 6 months.  Colonoscopy:  06/02/2018, normal, 10 year f/u BMD:  2009 normal per patient  TDaP:  03-08-18 Gardasil: N/A   reports that she has never smoked. She has never used smokeless tobacco. She reports that she does not drink alcohol or use drugs. She works in Therapist, art. Daughter lives locally (currently at home).   History reviewed. No pertinent past medical history.  Past Surgical History:  Procedure Laterality Date  . APPENDECTOMY    . BREAST BIOPSY Left   . BREAST EXCISIONAL BIOPSY Left   . BREAST SURGERY    . CESAREAN SECTION    . COLONOSCOPY      No current outpatient medications on file.   Current Facility-Administered Medications  Medication Dose Route Frequency Provider Last Rate Last Dose  . 0.9 %  sodium chloride infusion  500 mL Intravenous Once Doran Stabler, MD        Family History  Problem Relation Age of Onset  . Heart attack Father   . Heart attack Brother   . Cancer Brother        Mass on L5  . Diabetes Maternal Grandmother   . Heart attack Maternal Grandfather   . Heart attack Paternal Grandfather   . Cancer Brother   . Lung cancer Brother   . Cancer - Lung Brother        mets to  esophagus and brain  . Colon cancer Neg Hx   . Colon polyps Neg Hx   . Stomach cancer Neg Hx   . Rectal cancer Neg Hx     Review of Systems  Constitutional: Negative.   HENT: Negative.   Eyes: Negative.   Respiratory: Negative.   Cardiovascular: Negative.   Gastrointestinal: Negative.   Endocrine: Negative.   Genitourinary: Negative.   Musculoskeletal: Negative.   Skin: Negative.   Allergic/Immunologic: Negative.   Neurological: Negative.   Hematological: Negative.   Psychiatric/Behavioral: Negative.     Exam:   BP 136/86 (BP Location: Right Arm, Patient Position: Sitting, Cuff Size: Normal)   Pulse 66   Temp 97.8 F (36.6 C) (Skin)   Ht 5\' 3"  (1.6 m)   Wt 190 lb (86.2 kg)   LMP 06/08/2010   BMI 33.66 kg/m   Weight change: @WEIGHTCHANGE @ Height:   Height: 5\' 3"  (160 cm)  Ht Readings from Last 3 Encounters:  04/02/19 5\' 3"  (1.6 m)  06/02/18 5\' 3"  (1.6 m)  05/23/18 5\' 4"  (1.626 m)    General appearance: alert, cooperative and appears stated age Head: Normocephalic, without obvious abnormality, atraumatic Neck: no adenopathy, supple, symmetrical, trachea midline and thyroid normal to inspection  and palpation Lungs: clear to auscultation bilaterally Cardiovascular: regular rate and rhythm Breasts: normal appearance, no masses or tenderness Abdomen: soft, non-tender; non distended,  no masses,  no organomegaly Extremities: extremities normal, atraumatic, no cyanosis or edema Skin: Skin color, texture, turgor normal. No rashes or lesions Lymph nodes: Cervical, supraclavicular, and axillary nodes normal. No abnormal inguinal nodes palpated Neurologic: Grossly normal   Pelvic: External genitalia:  no lesions              Urethra:  normal appearing urethra with no masses, tenderness or lesions              Bartholins and Skenes: normal                 Vagina: atrophic appearing vagina with normal color and discharge, no lesions              Cervix: no lesions                Bimanual Exam:  Uterus:  normal size, contour, position, consistency, mobility, non-tender              Adnexa: no mass, fullness, tenderness               Rectovaginal: Confirms               Anus:  normal sphincter tone, no lesions  Chaperone was present for exam.  A:  Well Woman with normal exam  BMI 33, working on weight loss  P:   Discussed breast self exam  Discussed calcium and vit D intake  Returned for fasting labs  Mammogram just done  Colonoscopy UTD

## 2019-04-01 ENCOUNTER — Other Ambulatory Visit: Payer: Self-pay | Admitting: Obstetrics and Gynecology

## 2019-04-01 ENCOUNTER — Ambulatory Visit
Admission: RE | Admit: 2019-04-01 | Discharge: 2019-04-01 | Disposition: A | Discharge status: Home / Self Care | Payer: 59 | Source: Ambulatory Visit | Attending: Obstetrics and Gynecology | Admitting: Obstetrics and Gynecology

## 2019-04-01 ENCOUNTER — Other Ambulatory Visit: Payer: Self-pay

## 2019-04-01 DIAGNOSIS — N63 Unspecified lump in unspecified breast: Secondary | ICD-10-CM

## 2019-04-02 ENCOUNTER — Encounter: Payer: Self-pay | Admitting: Obstetrics and Gynecology

## 2019-04-02 ENCOUNTER — Ambulatory Visit (INDEPENDENT_AMBULATORY_CARE_PROVIDER_SITE_OTHER): Payer: 59 | Admitting: Obstetrics and Gynecology

## 2019-04-02 VITALS — BP 136/86 | HR 66 | Temp 97.8°F | Ht 63.0 in | Wt 190.0 lb

## 2019-04-02 DIAGNOSIS — Z01419 Encounter for gynecological examination (general) (routine) without abnormal findings: Secondary | ICD-10-CM

## 2019-04-02 DIAGNOSIS — Z0000 Encounter for general adult medical examination without abnormal findings: Secondary | ICD-10-CM | POA: Diagnosis not present

## 2019-04-02 NOTE — Patient Instructions (Signed)
EXERCISE AND DIET:  We recommended that you start or continue a regular exercise program for good health. Regular exercise means any activity that makes your heart beat faster and makes you sweat.  We recommend exercising at least 30 minutes per day at least 3 days a week, preferably 4 or 5.  We also recommend a diet low in fat and sugar.  Inactivity, poor dietary choices and obesity can cause diabetes, heart attack, stroke, and kidney damage, among others.    ALCOHOL AND SMOKING:  Women should limit their alcohol intake to no more than 7 drinks/beers/glasses of wine (combined, not each!) per week. Moderation of alcohol intake to this level decreases your risk of breast cancer and liver damage. And of course, no recreational drugs are part of a healthy lifestyle.  And absolutely no smoking or even second hand smoke. Most people know smoking can cause heart and lung diseases, but did you know it also contributes to weakening of your bones? Aging of your skin?  Yellowing of your teeth and nails?  CALCIUM AND VITAMIN D:  Adequate intake of calcium and Vitamin D are recommended.  The recommendations for exact amounts of these supplements seem to change often, but generally speaking 1,200 mg of calcium (between diet and supplement) and 800 units of Vitamin D per day seems prudent. Certain women may benefit from higher intake of Vitamin D.  If you are among these women, your doctor will have told you during your visit.    PAP SMEARS:  Pap smears, to check for cervical cancer or precancers,  have traditionally been done yearly, although recent scientific advances have shown that most women can have pap smears less often.  However, every woman still should have a physical exam from her gynecologist every year. It will include a breast check, inspection of the vulva and vagina to check for abnormal growths or skin changes, a visual exam of the cervix, and then an exam to evaluate the size and shape of the uterus and  ovaries.  And after 63 years of age, a rectal exam is indicated to check for rectal cancers. We will also provide age appropriate advice regarding health maintenance, like when you should have certain vaccines, screening for sexually transmitted diseases, bone density testing, colonoscopy, mammograms, etc.   MAMMOGRAMS:  All women over 40 years old should have a yearly mammogram. Many facilities now offer a "3D" mammogram, which may cost around $50 extra out of pocket. If possible,  we recommend you accept the option to have the 3D mammogram performed.  It both reduces the number of women who will be called back for extra views which then turn out to be normal, and it is better than the routine mammogram at detecting truly abnormal areas.    COLON CANCER SCREENING: Now recommend starting at age 45. At this time colonoscopy is not covered for routine screening until 50. There are take home tests that can be done between 45-49.   COLONOSCOPY:  Colonoscopy to screen for colon cancer is recommended for all women at age 50.  We know, you hate the idea of the prep.  We agree, BUT, having colon cancer and not knowing it is worse!!  Colon cancer so often starts as a polyp that can be seen and removed at colonscopy, which can quite literally save your life!  And if your first colonoscopy is normal and you have no family history of colon cancer, most women don't have to have it again for   10 years.  Once every ten years, you can do something that may end up saving your life, right?  We will be happy to help you get it scheduled when you are ready.  Be sure to check your insurance coverage so you understand how much it will cost.  It may be covered as a preventative service at no cost, but you should check your particular policy.      Breast Self-Awareness Breast self-awareness means being familiar with how your breasts look and feel. It involves checking your breasts regularly and reporting any changes to your  health care provider. Practicing breast self-awareness is important. A change in your breasts can be a sign of a serious medical problem. Being familiar with how your breasts look and feel allows you to find any problems early, when treatment is more likely to be successful. All women should practice breast self-awareness, including women who have had breast implants. How to do a breast self-exam One way to learn what is normal for your breasts and whether your breasts are changing is to do a breast self-exam. To do a breast self-exam: Look for Changes  1. Remove all the clothing above your waist. 2. Stand in front of a mirror in a room with good lighting. 3. Put your hands on your hips. 4. Push your hands firmly downward. 5. Compare your breasts in the mirror. Look for differences between them (asymmetry), such as: ? Differences in shape. ? Differences in size. ? Puckers, dips, and bumps in one breast and not the other. 6. Look at each breast for changes in your skin, such as: ? Redness. ? Scaly areas. 7. Look for changes in your nipples, such as: ? Discharge. ? Bleeding. ? Dimpling. ? Redness. ? A change in position. Feel for Changes Carefully feel your breasts for lumps and changes. It is best to do this while lying on your back on the floor and again while sitting or standing in the shower or tub with soapy water on your skin. Feel each breast in the following way:  Place the arm on the side of the breast you are examining above your head.  Feel your breast with the other hand.  Start in the nipple area and make  inch (2 cm) overlapping circles to feel your breast. Use the pads of your three middle fingers to do this. Apply light pressure, then medium pressure, then firm pressure. The light pressure will allow you to feel the tissue closest to the skin. The medium pressure will allow you to feel the tissue that is a little deeper. The firm pressure will allow you to feel the tissue  close to the ribs.  Continue the overlapping circles, moving downward over the breast until you feel your ribs below your breast.  Move one finger-width toward the center of the body. Continue to use the  inch (2 cm) overlapping circles to feel your breast as you move slowly up toward your collarbone.  Continue the up and down exam using all three pressures until you reach your armpit.  Write Down What You Find  Write down what is normal for each breast and any changes that you find. Keep a written record with breast changes or normal findings for each breast. By writing this information down, you do not need to depend only on memory for size, tenderness, or location. Write down where you are in your menstrual cycle, if you are still menstruating. If you are having trouble noticing differences   in your breasts, do not get discouraged. With time you will become more familiar with the variations in your breasts and more comfortable with the exam. How often should I examine my breasts? Examine your breasts every month. If you are breastfeeding, the best time to examine your breasts is after a feeding or after using a breast pump. If you menstruate, the best time to examine your breasts is 5-7 days after your period is over. During your period, your breasts are lumpier, and it may be more difficult to notice changes. When should I see my health care provider? See your health care provider if you notice:  A change in shape or size of your breasts or nipples.  A change in the skin of your breast or nipples, such as a reddened or scaly area.  Unusual discharge from your nipples.  A lump or thick area that was not there before.  Pain in your breasts.  Anything that concerns you.  

## 2019-04-07 ENCOUNTER — Other Ambulatory Visit (INDEPENDENT_AMBULATORY_CARE_PROVIDER_SITE_OTHER): Payer: 59

## 2019-04-07 ENCOUNTER — Other Ambulatory Visit: Payer: Self-pay

## 2019-04-07 DIAGNOSIS — Z0000 Encounter for general adult medical examination without abnormal findings: Secondary | ICD-10-CM

## 2019-04-08 LAB — LIPID PANEL
Chol/HDL Ratio: 4 ratio (ref 0.0–4.4)
Cholesterol, Total: 230 mg/dL — ABNORMAL HIGH (ref 100–199)
HDL: 57 mg/dL (ref >39)
LDL Calculated: 157 mg/dL — ABNORMAL HIGH (ref 0–99)
Triglycerides: 82 mg/dL (ref 0–149)
VLDL Cholesterol Cal: 16 mg/dL (ref 5–40)

## 2019-04-08 LAB — COMPREHENSIVE METABOLIC PANEL
ALT: 24 IU/L (ref 0–32)
AST: 20 IU/L (ref 0–40)
Albumin/Globulin Ratio: 1.9 (ref 1.2–2.2)
Albumin: 4.4 g/dL (ref 3.8–4.8)
Alkaline Phosphatase: 115 IU/L (ref 39–117)
BUN/Creatinine Ratio: 16 (ref 12–28)
BUN: 12 mg/dL (ref 8–27)
Bilirubin Total: 0.4 mg/dL (ref 0.0–1.2)
CO2: 24 mmol/L (ref 20–29)
Calcium: 9.5 mg/dL (ref 8.7–10.3)
Chloride: 103 mmol/L (ref 96–106)
Creatinine, Ser: 0.74 mg/dL (ref 0.57–1.00)
GFR calc Af Amer: 100 mL/min/{1.73_m2} (ref >59)
GFR calc non Af Amer: 86 mL/min/{1.73_m2} (ref >59)
Globulin, Total: 2.3 g/dL (ref 1.5–4.5)
Glucose: 91 mg/dL (ref 65–99)
Potassium: 4.2 mmol/L (ref 3.5–5.2)
Sodium: 142 mmol/L (ref 134–144)
Total Protein: 6.7 g/dL (ref 6.0–8.5)

## 2019-04-08 LAB — CBC
Hematocrit: 39.2 % (ref 34.0–46.6)
Hemoglobin: 13.1 g/dL (ref 11.1–15.9)
MCH: 30.7 pg (ref 26.6–33.0)
MCHC: 33.4 g/dL (ref 31.5–35.7)
MCV: 92 fL (ref 79–97)
Platelets: 224 10*3/uL (ref 150–450)
RBC: 4.27 x10E6/uL (ref 3.77–5.28)
RDW: 13.6 % (ref 11.7–15.4)
WBC: 5.9 10*3/uL (ref 3.4–10.8)

## 2019-10-05 ENCOUNTER — Ambulatory Visit
Admission: RE | Admit: 2019-10-05 | Discharge: 2019-10-05 | Disposition: A | Discharge status: Home / Self Care | Payer: 59 | Source: Ambulatory Visit | Attending: Obstetrics and Gynecology | Admitting: Obstetrics and Gynecology

## 2019-10-05 ENCOUNTER — Other Ambulatory Visit: Payer: Self-pay

## 2019-10-05 DIAGNOSIS — N63 Unspecified lump in unspecified breast: Secondary | ICD-10-CM

## 2019-12-31 ENCOUNTER — Other Ambulatory Visit: Payer: Self-pay

## 2019-12-31 ENCOUNTER — Ambulatory Visit: Payer: Self-pay | Attending: Internal Medicine

## 2019-12-31 DIAGNOSIS — Z23 Encounter for immunization: Secondary | ICD-10-CM

## 2019-12-31 NOTE — Progress Notes (Signed)
   Covid-19 Vaccination Clinic  Name:  LYSSETTE KIEU    MRN: [id number] DOB: [birth year]  12/31/2019  Ms. Hulbert was observed post Covid-19 immunization for 15 minutes without incident. She was provided with Vaccine Information Sheet and instruction to access the V-Safe system.   Ms. Wenzl was instructed to call 911 with any severe reactions post vaccine: Marland Kitchen Difficulty breathing  . Swelling of face and throat  . A fast heartbeat  . A bad rash all over body  . Dizziness and weakness   Immunizations Administered    Name Date Dose VIS Date Route   Moderna COVID-19 Vaccine 12/31/2019 11:01 AM 0.5 mL 09/08/2019 Intramuscular   Manufacturer: Moderna   Lot: [id number]   [id number]

## 2020-01-28 ENCOUNTER — Ambulatory Visit: Payer: Self-pay | Attending: Internal Medicine

## 2020-01-28 DIAGNOSIS — Z23 Encounter for immunization: Secondary | ICD-10-CM

## 2020-01-28 NOTE — Progress Notes (Signed)
   Covid-19 Vaccination Clinic  Name:  Sara Nicholson    MRN: [id number] DOB: October 24, 1955  01/28/2020  Sara Nicholson was observed post Covid-19 immunization for 15 minutes without incident. She was provided with Vaccine Information Sheet and instruction to access the V-Safe system.   Sara Nicholson was instructed to call 911 with any severe reactions post vaccine: Marland Kitchen Difficulty breathing  . Swelling of face and throat  . A fast heartbeat  . A bad rash all over body  . Dizziness and weakness   Immunizations Administered    Name Date Dose VIS Date Route   Moderna COVID-19 Vaccine 01/28/2020 10:07 AM 0.5 mL 09/2019 Intramuscular   Manufacturer: Moderna   Lot: [id number]   [id number]

## 2020-03-15 ENCOUNTER — Other Ambulatory Visit: Payer: Self-pay

## 2020-03-16 ENCOUNTER — Encounter: Payer: Self-pay | Admitting: Nurse Practitioner

## 2020-03-16 ENCOUNTER — Ambulatory Visit (INDEPENDENT_AMBULATORY_CARE_PROVIDER_SITE_OTHER): Payer: 59 | Admitting: Nurse Practitioner

## 2020-03-16 VITALS — BP 132/89 | HR 89 | Temp 97.5°F | Ht 63.0 in | Wt 211.6 lb

## 2020-03-16 DIAGNOSIS — G252 Other specified forms of tremor: Secondary | ICD-10-CM | POA: Diagnosis not present

## 2020-03-16 DIAGNOSIS — F418 Other specified anxiety disorders: Secondary | ICD-10-CM | POA: Diagnosis not present

## 2020-03-16 LAB — COMPREHENSIVE METABOLIC PANEL
ALT: 24 U/L (ref 0–35)
AST: 22 U/L (ref 0–37)
Albumin: 4.5 g/dL (ref 3.5–5.2)
Alkaline Phosphatase: 100 U/L (ref 39–117)
BUN: 16 mg/dL (ref 6–23)
CO2: 28 mEq/L (ref 19–32)
Calcium: 9.7 mg/dL (ref 8.4–10.5)
Chloride: 104 mEq/L (ref 96–112)
Creatinine, Ser: 0.72 mg/dL (ref 0.40–1.20)
GFR: 81.52 mL/min (ref >60.00)
Glucose, Bld: 157 mg/dL — ABNORMAL HIGH (ref 70–99)
Potassium: 3.9 mEq/L (ref 3.5–5.1)
Sodium: 139 mEq/L (ref 135–145)
Total Bilirubin: 0.4 mg/dL (ref 0.2–1.2)
Total Protein: 7.7 g/dL (ref 6.0–8.3)

## 2020-03-16 LAB — CBC
HCT: 40.3 % (ref 36.0–46.0)
Hemoglobin: 13.6 g/dL (ref 12.0–15.0)
MCHC: 33.7 g/dL (ref 30.0–36.0)
MCV: 92.1 fl (ref 78.0–100.0)
Platelets: 243 10*3/uL (ref 150.0–400.0)
RBC: 4.37 Mil/uL (ref 3.87–5.11)
RDW: 13.4 % (ref 11.5–15.5)
WBC: 6.8 10*3/uL (ref 4.0–10.5)

## 2020-03-16 LAB — TSH: TSH: 1.33 u[IU]/mL (ref 0.35–4.50)

## 2020-03-16 MED ORDER — ESCITALOPRAM OXALATE 10 MG PO TABS: 10.0000 mg | ORAL_TABLET | Freq: Every day | ORAL | 5 refills | Status: DC

## 2020-03-16 NOTE — Patient Instructions (Signed)
Go to lab for blood draw You will be contacted to schedule appt with neurology.  Tremor A tremor is trembling or shaking that you cannot control. Most tremors affect the hands or arms. Tremors can also affect the head, vocal cords, face, and other parts of the body. There are many types of tremors. Common types include:  Essential tremor. These usually occur in people older than 40. It may run in families and can happen in otherwise healthy people.  Resting tremor. These occur when the muscles are at rest, such as when your hands are resting in your lap. People with Parkinson's disease often have resting tremors.  Postural tremor. These occur when you try to hold a pose, such as keeping your hands outstretched.  Kinetic tremor. These occur during purposeful movement, such as trying to touch a finger to your nose.  Task-specific tremor. These may occur when you perform certain tasks such as writing, speaking, or standing.  Psychogenic tremor. These dramatically lessen or disappear when you are distracted. They can happen in people of all ages. Some types of tremors have no known cause. Tremors can also be a symptom of nervous system problems (neurological disorders) that may occur with aging. Some tremors go away with treatment, while others do not. Follow these instructions at home: Lifestyle      Limit alcohol intake to no more than 1 drink a day for nonpregnant women and 2 drinks a day for men. One drink equals 12 oz of beer, 5 oz of wine, or 1 oz of hard liquor.  Do not use any products that contain nicotine or tobacco, such as cigarettes and e-cigarettes. If you need help quitting, ask your health care provider.  Avoid extreme heat and extreme cold.  Limit your caffeine intake, as told by your health care provider.  Try to get 8 hours of sleep each night.  Find ways to manage your stress, such as meditation or yoga. General instructions  Take over-the-counter and  prescription medicines only as told by your health care provider.  Keep all follow-up visits as told by your health care provider. This is important. Contact a health care provider if you:  Develop a tremor after starting a new medicine.  Have a tremor along with other symptoms such as: ? Numbness. ? Tingling. ? Pain. ? Weakness.  Notice that your tremor gets worse.  Notice that your tremor interferes with your day-to-day life. Summary  A tremor is trembling or shaking that you cannot control.  Most tremors affect the hands or arms.  Some types of tremors have no known cause. Others may be a symptom of nervous system problems (neurological disorders).  Make sure you discuss any tremors you have with your health care provider. This information is not intended to replace advice given to you by your health care provider. Make sure you discuss any questions you have with your health care provider. Document Revised: 09/06/2017 Document Reviewed: 07/25/2017 Elsevier Patient Education  2020 [employer].

## 2020-03-16 NOTE — Progress Notes (Signed)
Subjective:  Patient ID: Sara Nicholson, female    DOB: [birth year]  Age: 64 y.o. MRN: [id number]  CC: Establish Care (New pt-pt is not fasting-shaking in hands-wants to see whats going on)  Tremor: Onset 1year ago, worsening with increase stress at home, denies any change in speech or gait or memory.denies any weakness or paresthesia. She has difficulty with writing due to tremor. She is able to take part in group exercise at the gym, but tremor makes it difficult to complete. FHX of neuromuscular degenerative disorder (mother at age 87), sister recently diagnosed with unspecified cognitive disorder. Denies any ETOH use or tobacco use or drug use.  Anxiety Presents for initial visit. Onset was 6 to 12 months ago. The problem has been gradually worsening. Symptoms include depressed mood, excessive worry, muscle tension and nervous/anxious behavior. Patient reports no chest pain, compulsions, confusion, decreased concentration, dizziness, dry mouth, feeling of choking, hyperventilation, impotence, insomnia, irritability, malaise, nausea, obsessions, palpitations, panic, restlessness, shortness of breath or suicidal ideas. Symptoms occur most days. The severity of symptoms is causing significant distress. The symptoms are aggravated by family issues.   Risk factors include family history and a major life event. Her past medical history is significant for anxiety/panic attacks and depression. There is no history of anemia, arrhythmia, asthma, bipolar disorder, CAD, CHF, chronic lung disease, fibromyalgia, hyperthyroidism or suicide attempts. Past treatments include SSRIs. The treatment provided significant relief. Compliance with prior treatments has been good.   Depression screen [facility] 2/9 03/16/2020 03/16/2020  Decreased Interest 0 0  Down, Depressed, Hopeless 0 0  PHQ - 2 Score 0 0  Altered sleeping 0 -  Tired, decreased energy 0 -  Change in appetite 0 -  Feeling bad or failure about yourself  0 -    Trouble concentrating 0 -  Moving slowly or fidgety/restless 0 -  Suicidal thoughts 0 -  PHQ-9 Score 0 -   GAD 7 : Generalized Anxiety Score 03/16/2020  Nervous, Anxious, on Edge 2  Control/stop worrying 1  Worry too much - different things 1  Trouble relaxing 0  Restless 0  Easily annoyed or irritable 1  Afraid - awful might happen 1  Total GAD 7 Score 6   Reviewed past Medical, Social and Family history today.  No outpatient medications prior to visit.   Facility-Administered Medications Prior to Visit  Medication Dose Route Frequency Provider Last Rate Last Admin  . 0.9 %  sodium chloride infusion  500 mL Intravenous Once Nelida Meuse III, MD        ROS See HPI  Objective:  BP 132/89   Pulse 89   Temp (!) 97.5 F (36.4 C) (Tympanic)   Ht 5\' 3"  (1.6 m)   Wt 211 lb 9.6 oz (96 kg)   LMP 06/08/2010   SpO2 99%   BMI 37.48 kg/m   BP Readings from Last 3 Encounters:  03/16/20 132/89  04/02/19 136/86  06/02/18 (!) 147/68    Wt Readings from Last 3 Encounters:  03/16/20 211 lb 9.6 oz (96 kg)  04/02/19 190 lb (86.2 kg)  06/02/18 213 lb (96.6 kg)    Physical Exam Vitals reviewed.  Constitutional:      Appearance: She is obese.  Cardiovascular:     Rate and Rhythm: Normal rate and regular rhythm.     Pulses: Normal pulses.     Heart sounds: Normal heart sounds.  Pulmonary:     Effort: Pulmonary effort is normal.  Breath sounds: Normal breath sounds.  Musculoskeletal:     Right lower leg: No edema.     Left lower leg: No edema.  Neurological:     Mental Status: She is alert and oriented to person, place, and time.     Cranial Nerves: No cranial nerve deficit, dysarthria or facial asymmetry.     Motor: Tremor present. No weakness, atrophy or abnormal muscle tone.     Coordination: Coordination is intact.     Gait: Gait is intact.     Deep Tendon Reflexes: Babinski sign absent on the right side. Babinski sign absent on the left side.     Reflex  Scores:      Bicep reflexes are 2+ on the right side and 2+ on the left side.      Patellar reflexes are 2+ on the right side and 2+ on the left side.    Comments: Resting tremor in right forearm and right leg     Lab Results  Component Value Date   WBC 5.9 04/07/2019   HGB 13.1 04/07/2019   HCT 39.2 04/07/2019   PLT 224 04/07/2019   GLUCOSE 91 04/07/2019   CHOL 230 (H) 04/07/2019   TRIG 82 04/07/2019   HDL 57 04/07/2019   LDLDIRECT 158.0 02/23/2009   LDLCALC 157 (H) 04/07/2019   ALT 24 04/07/2019   AST 20 04/07/2019   NA 142 04/07/2019   K 4.2 04/07/2019   CL 103 04/07/2019   CREATININE 0.74 04/07/2019   BUN 12 04/07/2019   CO2 24 04/07/2019   TSH 0.99 12/08/2013    Assessment & Plan:  This visit occurred during the SARS-CoV-2 public health emergency.  Safety protocols were in place, including screening questions prior to the visit, additional usage of staff PPE, and extensive cleaning of exam room while observing appropriate contact time as indicated for disinfecting solutions.   Sara Nicholson was seen today for establish care.  Diagnoses and all orders for this visit:  Resting tremor -     Ambulatory referral to Neurology -     CBC -     Comprehensive metabolic panel -     TSH -     Ceruloplasmin  Anxiety about health -     escitalopram (LEXAPRO) 10 MG tablet; Take 1 tablet (10 mg total) by mouth daily. -     Ambulatory referral to Psychology   I am having Sara Nicholson. [name] start on escitalopram. We will continue to administer sodium chloride.  Meds ordered this encounter  Medications  . escitalopram (LEXAPRO) 10 MG tablet    Sig: Take 1 tablet (10 mg total) by mouth daily.    Dispense:  30 tablet    Refill:  5    Order Specific Question:   Supervising Provider    Answer:   Ronnald Nian [[id number]]    Problem List Items Addressed This Visit    None    Visit Diagnoses    Resting tremor    -  Primary   Relevant Orders   Ambulatory referral to Neurology    CBC   Comprehensive metabolic panel   TSH   Ceruloplasmin   Anxiety about health       Relevant Medications   escitalopram (LEXAPRO) 10 MG tablet   Other Relevant Orders   Ambulatory referral to Psychology      Follow-up: Return if symptoms worsen or fail to improve.  Wilfred Lacy, NP

## 2020-03-17 LAB — CERULOPLASMIN: Ceruloplasmin: 29 mg/dL (ref 18–53)

## 2020-03-18 ENCOUNTER — Encounter: Payer: Self-pay | Admitting: Neurology

## 2020-03-30 ENCOUNTER — Other Ambulatory Visit: Payer: Self-pay

## 2020-03-30 NOTE — Progress Notes (Deleted)
Assessment/Plan:    ***  Subjective:   Sara Nicholson was seen today in the movement disorders clinic for neurologic consultation at the request of Nche, Charlene Brooke, NP.  The consultation is for the evaluation of resting tremor on R.  Outside records that were made available to me were reviewed.   Specific Symptoms:  Tremor: {yes no:314532} Family hx of similar:  {yes no:314532} Voice: *** Sleep: ***  Vivid Dreams:  {yes no:314532}  Acting out dreams:  {yes no:314532} Wet Pillows: {yes no:314532} Postural symptoms:  {yes no:314532}  Falls?  {yes no:314532} Bradykinesia symptoms: {parkinson brady:18041} Loss of smell:  {yes no:314532} Loss of taste:  {yes no:314532} Urinary Incontinence:  {yes no:314532} Difficulty Swallowing:  {yes no:314532} Handwriting, micrographia: {yes no:314532} Trouble with ADL's:  {yes no:314532}  Trouble buttoning clothing: {yes no:314532} Depression:  {yes no:314532} Memory changes:  {yes no:314532} Hallucinations:  {yes no:314532}  visual distortions: {yes no:314532} N/V:  {yes no:314532} Lightheaded:  {yes no:314532}  Syncope: {yes no:314532} Diplopia:  {yes no:314532} Dyskinesia:  {yes no:314532} Prior exposure to reglan/antipsychotics: {yes no:314532}  Neuroimaging of the brain has not previously been performed.    PREVIOUS MEDICATIONS: {Parkinson's RX:18200}  ALLERGIES:   Allergies  Allergen Reactions  . Terbinafine Hcl     CURRENT MEDICATIONS:  Current Outpatient Medications  Medication Instructions  . escitalopram (LEXAPRO) 10 mg, Oral, Daily    Objective:   VITALS:  There were no vitals filed for this visit.  GEN:  The patient appears stated age and is in NAD. HEENT:  Normocephalic, atraumatic.  The mucous membranes are moist. The superficial temporal arteries are without ropiness or tenderness. CV:  RRR Lungs:  CTAB Neck/HEME:  There are no carotid bruits bilaterally.  Neurological  examination:  Orientation: The patient is alert and oriented x3.  Cranial nerves: There is good facial symmetry. Extraocular muscles are intact. The visual fields are full to confrontational testing. The speech is fluent and clear. Soft palate rises symmetrically and there is no tongue deviation. Hearing is intact to conversational tone. Sensation: Sensation is intact to light and pinprick throughout (facial, trunk, extremities). Vibration is intact at the bilateral big toe. There is no extinction with double simultaneous stimulation. There is no sensory dermatomal level identified. Motor: Strength is 5/5 in the bilateral upper and lower extremities.   Shoulder shrug is equal and symmetric.  There is no pronator drift. Deep tendon reflexes: Deep tendon reflexes are 2/4 at the bilateral biceps, triceps, brachioradialis, patella and achilles. Plantar responses are downgoing bilaterally.  Movement examination: Tone: There is ***tone in the bilateral upper extremities.  The tone in the lower extremities is ***.  Abnormal movements: *** Coordination:  There is *** decremation with RAM's, *** Gait and Station: The patient has *** difficulty arising out of a deep-seated chair without the use of the hands. The patient's stride length is ***.  The patient has a *** pull test.     I have reviewed and interpreted the following labs independently   Chemistry      Component Value Date/Time   NA 139 03/16/2020 1349   NA 142 04/07/2019 0822   K 3.9 03/16/2020 1349   CL 104 03/16/2020 1349   CO2 28 03/16/2020 1349   BUN 16 03/16/2020 1349   BUN 12 04/07/2019 0822   CREATININE 0.72 03/16/2020 1349      Component Value Date/Time   CALCIUM 9.7 03/16/2020 1349   ALKPHOS 100 03/16/2020 1349   AST 22  03/16/2020 1349   ALT 24 03/16/2020 1349   BILITOT 0.4 03/16/2020 1349   BILITOT 0.4 04/07/2019 0822      Lab Results  Component Value Date   TSH 1.33 03/16/2020   Lab Results  Component Value Date    WBC 6.8 03/16/2020   HGB 13.6 03/16/2020   HCT 40.3 03/16/2020   MCV 92.1 03/16/2020   PLT 243.0 03/16/2020     Total time spent on today's visit was ***60 minutes, including both face-to-face time and nonface-to-face time.  Time included that spent on review of records (prior notes available to me/labs/imaging if pertinent), discussing treatment and goals, answering patient's questions and coordinating care.  Cc:  Nche, Charlene Brooke, NP

## 2020-03-31 NOTE — Progress Notes (Signed)
Assessment/Plan:   1.  Tremor  -doesn't quite meet [place] brain bank criteria for Parkinson's, as really is not that bradykinetic (just slight slowing finger taps).  I do think a DaTscan could be very useful.  Patient and I talked about the fact that this is not diagnostic for differentiating parkinsonian syndromes from essential tremor, but that it could give [place] useful information.  She was agreeable.  -Asked the patient to hold her Lexapro 10 days prior to the DaTscan, just so that we do not confuse the issue as it can produce false positives.  -Her mother had some sort of degenerative neurologic condition, but had an unknown diagnosis.  -Patient asked about exercise.  I encouraged her to continue to do that.  She is doing classes through the local gym.  -Patient very claustrophobic and does not think she could do MRI without being completely sedated.  Her neuro exam is nonfocal and nonlateralizing, with the exception of tremor, and I do not think we otherwise need to do neuroimaging that would require sedation under anesthesia.  2.  GAD  -Could be driving tremor somewhat, although do not think it is likely the sole cause.  She and I discussed this today.  3.  We will plan to follow-up after the above has been completed.  Subjective:   Sara Nicholson was seen today in the movement disorders clinic for neurologic consultation at the request of Nche, Charlene Brooke, NP.  The consultation is for the evaluation of resting tremor on R.  Outside records that were made available to me were reviewed. This patient is accompanied in the office by her spouse who supplements the history.   Specific Symptoms:  Tremor: Yes.  , started 12/2018.  Increased since 08/2019.  R hand is where it started.  Now in bilateral lower extremities over both legs over the last 2 months.  R hand dominant.  Notes it when lifting weights.  Notes it at rest.  She can stop it.   Family hx of similar:  Yes.  , cousin has a  "nervous jerk." Voice: no change Sleep: sleep well  Vivid Dreams:  No.  Acting out dreams:  Sleep talk Wet Pillows: No. Postural symptoms:  No.  Falls?  No., none in 4 years Bradykinesia symptoms: no bradykinesia noted Loss of smell: mild decreased Loss of taste:  No. Urinary Incontinence:  No. Difficulty Swallowing:  No. Handwriting, micrographia: "a little smaller" Trouble with ADL's:  No.  Trouble buttoning clothing: No. Depression:  Yes.   just placed on lexapro on saturday Memory changes:  No. Hallucinations:  No.  visual distortions: No. N/V:  No. Lightheaded:  No.  Syncope: No. Diplopia:  No. Dyskinesia:  No. Prior exposure to reglan/antipsychotics: No.  Neuroimaging of the brain has not previously been performed.    PREVIOUS MEDICATIONS: none to date  ALLERGIES:   Allergies  Allergen Reactions  . Terbinafine Hcl     CURRENT MEDICATIONS:  Current Outpatient Medications  Medication Instructions  . escitalopram (LEXAPRO) 10 mg, Oral, Daily    Objective:   VITALS:   Vitals:   04/05/20 0834  BP: (!) 150/77  Pulse: 96  SpO2: 98%  Weight: 208 lb (94.3 kg)  Height: 5\' 3"  (1.6 m)    GEN:  The patient appears stated age.  Pt anxious appearing HEENT:  Normocephalic, atraumatic.  The mucous membranes are moist. The superficial temporal arteries are without ropiness or tenderness. CV:  RRR Lungs:  CTAB Neck/HEME:  There are no carotid bruits bilaterally.  Neurological examination:  Orientation: The patient is alert and oriented x3.  Cranial nerves: There is good facial symmetry.  There is no facial hypomimia.  Extraocular muscles are intact. The visual fields are full to confrontational testing. The speech is fluent and clear. Soft palate rises symmetrically and there is no tongue deviation. Hearing is intact to conversational tone. Sensation: Sensation is intact to light and pinprick throughout (facial, trunk, extremities). Vibration is intact at the  bilateral big toe. There is no extinction with double simultaneous stimulation. There is no sensory dermatomal level identified. Motor: Strength is 5/5 in the bilateral upper and lower extremities.   Shoulder shrug is equal and symmetric.  There is no pronator drift. Deep tendon reflexes: Deep tendon reflexes are 2/4 at the bilateral biceps, triceps, brachioradialis, patella and achilles. Plantar responses are downgoing bilaterally.  Movement examination: Tone: There is normal tone in the bilateral upper extremities.  The tone in the lower extremities is normal.  Abnormal movements: there is intermittent RUE and bilateral lower extremity rest tremor.   Coordination:  There is decremation with RAM's, only with finger taps on the right and quite mild.  All other RAMs are normal bilaterally Gait and Station: The patient has no difficulty arising out of a deep-seated chair without the use of the hands. The patient's stride length is good with rare RUE tremor with ambulation.   I have reviewed and interpreted the following labs independently   Chemistry      Component Value Date/Time   NA 139 03/16/2020 1349   NA 142 04/07/2019 0822   K 3.9 03/16/2020 1349   CL 104 03/16/2020 1349   CO2 28 03/16/2020 1349   BUN 16 03/16/2020 1349   BUN 12 04/07/2019 0822   CREATININE 0.72 03/16/2020 1349      Component Value Date/Time   CALCIUM 9.7 03/16/2020 1349   ALKPHOS 100 03/16/2020 1349   AST 22 03/16/2020 1349   ALT 24 03/16/2020 1349   BILITOT 0.4 03/16/2020 1349   BILITOT 0.4 04/07/2019 0822      Lab Results  Component Value Date   TSH 1.33 03/16/2020   Lab Results  Component Value Date   WBC 6.8 03/16/2020   HGB 13.6 03/16/2020   HCT 40.3 03/16/2020   MCV 92.1 03/16/2020   PLT 243.0 03/16/2020   Total time spent on today's visit was 60 minutes, with greater than 50% of the visit in counseling.   Cc:  Nche, Charlene Brooke, NP

## 2020-04-01 ENCOUNTER — Other Ambulatory Visit: Payer: Self-pay

## 2020-04-04 ENCOUNTER — Ambulatory Visit: Payer: 59 | Admitting: Neurology

## 2020-04-04 ENCOUNTER — Other Ambulatory Visit: Payer: Self-pay

## 2020-04-04 ENCOUNTER — Ambulatory Visit (INDEPENDENT_AMBULATORY_CARE_PROVIDER_SITE_OTHER): Payer: 59 | Admitting: Obstetrics and Gynecology

## 2020-04-04 ENCOUNTER — Other Ambulatory Visit (HOSPITAL_COMMUNITY)
Admission: RE | Admit: 2020-04-04 | Discharge: 2020-04-04 | Disposition: A | Discharge status: Home / Self Care | Payer: 59 | Source: Ambulatory Visit | Attending: Obstetrics and Gynecology | Admitting: Obstetrics and Gynecology

## 2020-04-04 ENCOUNTER — Encounter: Payer: Self-pay | Admitting: Obstetrics and Gynecology

## 2020-04-04 VITALS — BP 128/70 | HR 72 | Temp 97.5°F | Ht 63.25 in | Wt 207.0 lb

## 2020-04-04 DIAGNOSIS — Z124 Encounter for screening for malignant neoplasm of cervix: Secondary | ICD-10-CM | POA: Diagnosis present

## 2020-04-04 DIAGNOSIS — Z01419 Encounter for gynecological examination (general) (routine) without abnormal findings: Secondary | ICD-10-CM

## 2020-04-04 NOTE — Progress Notes (Signed)
64 y.o. G62P1001 Married White or Caucasian Not Hispanic or Latino female here for annual exam.  No vaginal bleeding. Sexually active, no pain.     She has developed a worsening tremor since the end of last year, started in 3/20. She is seeing a Neurologist in the am, primary is concerned she may have Parkinson's.   She just started lexapro, the tremor is driving her crazy, she is so worried.   Patient's last menstrual period was 06/08/2010.          Sexually active: Yes.    The current method of family planning is post menopausal status.    Exercising: Yes.    body combat Smoker:  no  Health Maintenance: Pap:   03-21-17 ASCUS NEG HR HPV  12-16-12 WNL NEG HR HPV History of abnormal Pap:  Yes, had laser surgery in the early 80's MMG:  10/05/19 Diagnostic Bilateral MM/Left Breast US - BIRADS 3 Probably Benign BMD:   2009 Normal per patient Colonoscopy: 06/02/18, Normal, f/u 10 years TDaP:  03/08/18 Gardasil: N/a   reports that she has never smoked. She has never used smokeless tobacco. She reports that she does not drink alcohol and does not use drugs.  She works in Therapist, art. Daughter living at home currently.   History reviewed. No pertinent past medical history.  Past Surgical History:  Procedure Laterality Date  . APPENDECTOMY    . BREAST BIOPSY Left   . BREAST EXCISIONAL BIOPSY Left   . BREAST SURGERY    . CESAREAN SECTION    . COLONOSCOPY      Current Outpatient Medications  Medication Sig Dispense Refill  . escitalopram (LEXAPRO) 10 MG tablet Take 1 tablet (10 mg total) by mouth daily. 30 tablet 5   Current Facility-Administered Medications  Medication Dose Route Frequency Provider Last Rate Last Admin  . 0.9 %  sodium chloride infusion  500 mL Intravenous Once Doran Stabler, MD        Family History  Problem Relation Age of Onset  . Heart attack Father   . Heart attack Brother   . Cancer Brother        Mass on L5  . Diabetes Maternal Grandmother   .  Heart attack Maternal Grandfather   . Heart attack Paternal Grandfather   . Cancer Brother   . Lung cancer Brother   . Cancer - Lung Brother        mets to esophagus and brain  . Neurologic Disorder Sister 62       mild cognitive disorder  . Neuromuscular disorder Mother 72  . Colon cancer Neg Hx   . Colon polyps Neg Hx   . Stomach cancer Neg Hx   . Rectal cancer Neg Hx     Review of Systems  Constitutional: Negative.   HENT: Negative.   Eyes: Negative.   Respiratory: Negative.   Cardiovascular: Negative.   Gastrointestinal: Negative.   Endocrine: Negative.   Genitourinary: Negative.   Musculoskeletal: Negative.   Skin: Negative.   Allergic/Immunologic: Negative.   Neurological: Negative.   Hematological: Negative.   Psychiatric/Behavioral: Negative.     Exam:   BP 128/70 (BP Location: Right Arm, Patient Position: Sitting, Cuff Size: Large)   Pulse 72   Temp (!) 97.5 F (36.4 C) (Temporal)   Ht 5' 3.25" (1.607 m)   Wt 207 lb (93.9 kg)   LMP 06/08/2010   BMI 36.38 kg/m   Weight change: @WEIGHTCHANGE @ Height:   Height: 5'  3.25" (160.7 cm)  Ht Readings from Last 3 Encounters:  04/04/20 5' 3.25" (1.607 m)  03/16/20 5\' 3"  (1.6 m)  04/02/19 5\' 3"  (1.6 m)    General appearance: alert, cooperative and appears stated age Head: Normocephalic, without obvious abnormality, atraumatic Neck: no adenopathy, supple, symmetrical, trachea midline and thyroid normal to inspection and palpation Lungs: clear to auscultation bilaterally Cardiovascular: regular rate and rhythm Breasts: normal appearance, no masses or tenderness Abdomen: soft, non-tender; non distended,  no masses,  no organomegaly Extremities: extremities normal, atraumatic, no cyanosis or edema Skin: Skin color, texture, turgor normal. No rashes or lesions Lymph nodes: Cervical, supraclavicular, and axillary nodes normal. No abnormal inguinal nodes palpated Neurologic: Grossly normal   Pelvic: External  genitalia:  no lesions              Urethra:  normal appearing urethra with no masses, tenderness or lesions              Bartholins and Skenes: normal                 Vagina: mildly atrophic appearing vagina with normal color and discharge, no lesions              Cervix: no lesions               Bimanual Exam:  Uterus:  normal size, contour, position, consistency, mobility, non-tender              Adnexa: no mass, fullness, tenderness               Rectovaginal: Confirms               Anus:  normal sphincter tone, no lesions  Terence Lux chaperoned for the exam.  A:  Well Woman with normal exam  Being evaluated for a tremor, possible parkinson's   P:   Pap with hpv  Labs with primary  Mammogram in 12/21  Colonoscopy UTD  Discussed breast self exam  Discussed calcium and vit D intake

## 2020-04-04 NOTE — Patient Instructions (Signed)

## 2020-04-05 ENCOUNTER — Ambulatory Visit (INDEPENDENT_AMBULATORY_CARE_PROVIDER_SITE_OTHER): Payer: 59 | Admitting: Neurology

## 2020-04-05 ENCOUNTER — Encounter: Payer: Self-pay | Admitting: Neurology

## 2020-04-05 ENCOUNTER — Encounter: Payer: Self-pay | Admitting: Obstetrics and Gynecology

## 2020-04-05 VITALS — BP 150/77 | HR 96 | Ht 63.0 in | Wt 208.0 lb

## 2020-04-05 DIAGNOSIS — F411 Generalized anxiety disorder: Secondary | ICD-10-CM

## 2020-04-05 DIAGNOSIS — R251 Tremor, unspecified: Secondary | ICD-10-CM | POA: Diagnosis not present

## 2020-04-05 LAB — CYTOLOGY - PAP
Comment: NEGATIVE
Diagnosis: NEGATIVE
Diagnosis: REACTIVE
High risk HPV: NEGATIVE

## 2020-04-05 NOTE — Patient Instructions (Signed)
After Visit Summary:   Medication Changes: Your physician recommends that you continue on your current medications as directed. Please refer to the Current Medication list given to you today.  Lab work: None Ordered  Testing: DaTscan has been ordered. Once the form is submitted to the company they will get the test approved and someone from the hospital will contact you to schedule an appt.   Referral:  None   Follow up:  Your physician recommends that you schedule a follow-up appointment in: August 2021

## 2020-04-27 ENCOUNTER — Telehealth: Payer: Self-pay

## 2020-04-27 NOTE — Telephone Encounter (Signed)
Called patient to remind her to hold Lexapro 10 days prior to [place]. Patient states she is aware and she has already stopped Lexapro and thanked me for calling.

## 2020-05-05 ENCOUNTER — Other Ambulatory Visit: Payer: Self-pay

## 2020-05-05 ENCOUNTER — Ambulatory Visit (HOSPITAL_COMMUNITY)
Admission: RE | Admit: 2020-05-05 | Discharge: 2020-05-05 | Disposition: A | Discharge status: Home / Self Care | Payer: 59 | Source: Ambulatory Visit | Attending: Neurology | Admitting: Neurology

## 2020-05-05 DIAGNOSIS — R251 Tremor, unspecified: Secondary | ICD-10-CM | POA: Diagnosis present

## 2020-05-05 MED ORDER — IODINE STRONG (LUGOLS) 5 % PO SOLN
ORAL | Status: AC
  Administered 2020-05-05: 0.8 mL via ORAL
  Filled 2020-05-05: qty 1

## 2020-05-05 MED ORDER — IOFLUPANE I 123 185 MBQ/2.5ML IV SOLN
4.6000 | Freq: Once | INTRAVENOUS | Status: AC | PRN
  Administered 2020-05-05: 4.6 via INTRAVENOUS
  Filled 2020-05-05: qty 5

## 2020-05-05 MED ORDER — IODINE STRONG (LUGOLS) 5 % PO SOLN: 0.8000 mL | Freq: Once | ORAL | Status: AC

## 2020-05-09 ENCOUNTER — Ambulatory Visit: Payer: 59 | Admitting: Psychology

## 2020-05-09 ENCOUNTER — Ambulatory Visit (INDEPENDENT_AMBULATORY_CARE_PROVIDER_SITE_OTHER): Payer: 59 | Admitting: Psychology

## 2020-05-09 DIAGNOSIS — F4322 Adjustment disorder with anxiety: Secondary | ICD-10-CM

## 2020-05-16 NOTE — Progress Notes (Signed)
Assessment/Plan:   1.  Abnormal DaT  -We discussed that this was nondiagnostic, but rather an informative scan, but given her tremor and mild bradykinesia, she likely does have early Parkinson's disease.  We discussed nature and pathophysiology.  Discussed medication options and whether or not to start medication.   Discussed with her that even if we started medication, that does not change progression of disease.  She wants to hold on medication for right now.  We discussed the importance of safe, cardiovascular exercise.  She is agreeable with the exercise and already does body combat classes 2 days/week.  Patient asked many questions and I answered those to the best of my ability today.  We talked about diet, specifically Mediterranean diet.  Patient education was provided today.  2.  I will plan on seeing her back in the next 6 months, sooner should new neurologic issues arise.   Subjective:   Sara Nicholson was seen today in follow up for results of DaT scan.  My previous records were reviewed prior to todays visit as well as outside records available to me.   DaT scan personally reviewed.  DaTscan reported to show decreased radiotracer activity in the posterior aspect of the left and right putamen, but very symmetric and intense uptake in the caudate heads bilaterally.  Pt states that since our last visit her R big toe has started to curl.  Last night, while sleeping, she had a feeling of tightness in the R arm.    Current prescribed movement disorder medications: None   PREVIOUS MEDICATIONS: none to date  ALLERGIES:   Allergies  Allergen Reactions  . Terbinafine Hcl     CURRENT MEDICATIONS:  Outpatient Encounter Medications as of 05/18/2020  Medication Sig  . escitalopram (LEXAPRO) 10 MG tablet Take 1 tablet (10 mg total) by mouth daily.   Facility-Administered Encounter Medications as of 05/18/2020  Medication  . 0.9 %  sodium chloride infusion    Objective:   PHYSICAL  EXAMINATION:    VITALS:   Vitals:   05/18/20 0909  BP: (!) 157/85  Pulse: 84  SpO2: 96%  Weight: 205 lb (93 kg)  Height: 5\' 4"  (1.626 m)    GEN:  The patient appears stated age and is in NAD. HEENT:  Normocephalic, atraumatic.  The mucous membranes are moist. The superficial temporal arteries are without ropiness or tenderness. CV:  RRR Lungs:  CTAB Neck/HEME:  There are no carotid bruits bilaterally.  Neurological examination:  Orientation: The patient is alert and oriented x3. Cranial nerves: There is good facial symmetry with mild facial hypomimia. The speech is fluent and clear. Soft palate rises symmetrically and there is no tongue deviation. Hearing is intact to conversational tone. Sensation: Sensation is intact to light touch throughout Motor: Strength is at least antigravity x4.  Movement examination: Tone: There is normal tone in the upper and lower extremities Abnormal movements: Intermittent right upper extremity and bilateral lower extremity rest tremor Coordination:  There is decremation with RAM's, with finger taps on the right.  All other rapid alternating movements are normal bilaterally Gait and Station: The patient has no difficulty arising out of a deep-seated chair without the use of the hands. The patient's stride length is good.    I have reviewed and interpreted the following labs independently    Chemistry      Component Value Date/Time   NA 139 03/16/2020 1349   NA 142 04/07/2019 0822   K 3.9 03/16/2020  1349   CL 104 03/16/2020 1349   CO2 28 03/16/2020 1349   BUN 16 03/16/2020 1349   BUN 12 04/07/2019 0822   CREATININE 0.72 03/16/2020 1349      Component Value Date/Time   CALCIUM 9.7 03/16/2020 1349   ALKPHOS 100 03/16/2020 1349   AST 22 03/16/2020 1349   ALT 24 03/16/2020 1349   BILITOT 0.4 03/16/2020 1349   BILITOT 0.4 04/07/2019 0822       Lab Results  Component Value Date   WBC 6.8 03/16/2020   HGB 13.6 03/16/2020   HCT 40.3  03/16/2020   MCV 92.1 03/16/2020   PLT 243.0 03/16/2020    Lab Results  Component Value Date   TSH 1.33 03/16/2020     Total time spent on today's visit was 45 minutes, including both face-to-face time and nonface-to-face time.  Time included that spent on review of records (prior notes available to me/labs/imaging if pertinent), discussing treatment and goals, answering patient's questions and coordinating care.  Cc:  Nche, Charlene Brooke, NP

## 2020-05-18 ENCOUNTER — Encounter: Payer: Self-pay | Admitting: Neurology

## 2020-05-18 ENCOUNTER — Other Ambulatory Visit: Payer: Self-pay

## 2020-05-18 ENCOUNTER — Ambulatory Visit (INDEPENDENT_AMBULATORY_CARE_PROVIDER_SITE_OTHER): Payer: 59 | Admitting: Neurology

## 2020-05-18 VITALS — BP 157/85 | HR 84 | Ht 64.0 in | Wt 205.0 lb

## 2020-05-18 DIAGNOSIS — G20 Parkinson's disease: Secondary | ICD-10-CM | POA: Diagnosis not present

## 2020-05-18 NOTE — Patient Instructions (Signed)
Exercise!  Good to see you!  The physicians and staff at [facility] are committed to providing excellent care. You may receive a survey requesting feedback about your experience at our office. We strive to receive "very good" responses to the survey questions. If you feel that your experience would prevent you from giving the office a "very good " response, please contact our office to try to remedy the situation. We may be reached at [phone]. Thank you for taking the time out of your busy day to complete the survey.

## 2020-06-20 ENCOUNTER — Ambulatory Visit (INDEPENDENT_AMBULATORY_CARE_PROVIDER_SITE_OTHER): Payer: 59 | Admitting: Psychology

## 2020-06-20 DIAGNOSIS — F4323 Adjustment disorder with mixed anxiety and depressed mood: Secondary | ICD-10-CM | POA: Diagnosis not present

## 2020-06-20 NOTE — Progress Notes (Signed)
Assessment/Plan:   1.  Parkinson's disease  -We have discussed treatments, and ultimately decided to start on low-dose pramipexole.  We discussed risk, benefits, and side effects, including but not limited to compulsive behaviors and sleep attacks.  We will slowly work up to 0.5 mg 3 times per day.  R/B/SE were discussed.  The opportunity to ask questions was given and they were answered to the best of my ability.  The patient expressed understanding and willingness to follow the outlined treatment protocols.  -She is really doing well with exercise, specifically body combat class which she likes.  She will continue that.  2. Pt already has a f/u appt   Subjective:   Sara Nicholson was seen today in follow up.  Pt with husband who supplements the history.  Patient seen a few weeks ago, and ultimately she decided to not start on medication.  She emailed me yesterday stating that she was reconsidering that because she was having more tremor and her right leg was feeling tight.  She is not sure if she just overworked her R leg in exercise but she feels "like a mess."  She woke up last week and just had trouble lifting the R leg.  It is getting better today.  She has been doing a lot of exercise - all but one day in the last 30 days.    Current prescribed movement disorder medications: None   PREVIOUS MEDICATIONS: none to date  ALLERGIES:   Allergies  Allergen Reactions  . Terbinafine Hcl     CURRENT MEDICATIONS:  Outpatient Encounter Medications as of 06/21/2020  Medication Sig  . escitalopram (LEXAPRO) 10 MG tablet Take 1 tablet (10 mg total) by mouth daily.   Facility-Administered Encounter Medications as of 06/21/2020  Medication  . 0.9 %  sodium chloride infusion    Objective:   PHYSICAL EXAMINATION:    VITALS:   Vitals:   06/21/20 1255  BP: (!) 178/83  Pulse: 84  SpO2: 97%  Weight: 207 lb (93.9 kg)  Height: 5\' 4"  (1.626 m)   Wt Readings from Last 3 Encounters:    06/21/20 207 lb (93.9 kg)  05/18/20 205 lb (93 kg)  04/05/20 208 lb (94.3 kg)     GEN:  The patient appears stated age and is in NAD. HEENT:  Normocephalic, atraumatic.  The mucous membranes are moist. The superficial temporal arteries are without ropiness or tenderness. CV:  RRR Lungs:  CTAB Neck/HEME:  There are no carotid bruits bilaterally.  Neurological examination:  Orientation: The patient is alert and oriented x3. Cranial nerves: There is good facial symmetry with mild facial hypomimia. The speech is fluent and clear. Soft palate rises symmetrically and there is no tongue deviation. Hearing is intact to conversational tone. Sensation: Sensation is intact to light touch throughout Motor: Strength is at least antigravity x4.  Movement examination: Tone: There is mild increased tone in the RUE Abnormal movements: Intermittent right upper extremity and bilateral lower extremity rest tremor Coordination:  There is decremation with RAM's on the right.  All other rapid alternating movements are normal bilaterally Gait and Station: The patient has no difficulty arising out of a deep-seated chair without the use of the hands. The patient's stride length is good but there is slight decreased arm swing on the right  I have reviewed and interpreted the following labs independently    Chemistry      Component Value Date/Time   NA 139 03/16/2020 1349  NA 142 04/07/2019 0822   K 3.9 03/16/2020 1349   CL 104 03/16/2020 1349   CO2 28 03/16/2020 1349   BUN 16 03/16/2020 1349   BUN 12 04/07/2019 0822   CREATININE 0.72 03/16/2020 1349      Component Value Date/Time   CALCIUM 9.7 03/16/2020 1349   ALKPHOS 100 03/16/2020 1349   AST 22 03/16/2020 1349   ALT 24 03/16/2020 1349   BILITOT 0.4 03/16/2020 1349   BILITOT 0.4 04/07/2019 0822       Lab Results  Component Value Date   WBC 6.8 03/16/2020   HGB 13.6 03/16/2020   HCT 40.3 03/16/2020   MCV 92.1 03/16/2020   PLT 243.0  03/16/2020    Lab Results  Component Value Date   TSH 1.33 03/16/2020     Total time spent on today's visit was 30 minutes, including both face-to-face time and nonface-to-face time.  Time included that spent on review of records (prior notes available to me/labs/imaging if pertinent), discussing treatment and goals, answering patient's questions and coordinating care.  Cc:  Nche, Charlene Brooke, NP

## 2020-06-21 ENCOUNTER — Encounter: Payer: Self-pay | Admitting: Neurology

## 2020-06-21 ENCOUNTER — Ambulatory Visit (INDEPENDENT_AMBULATORY_CARE_PROVIDER_SITE_OTHER): Payer: 59 | Admitting: Neurology

## 2020-06-21 ENCOUNTER — Other Ambulatory Visit: Payer: Self-pay

## 2020-06-21 VITALS — BP 178/83 | HR 84 | Ht 64.0 in | Wt 207.0 lb

## 2020-06-21 DIAGNOSIS — G20 Parkinson's disease: Secondary | ICD-10-CM

## 2020-06-21 MED ORDER — PRAMIPEXOLE DIHYDROCHLORIDE 0.5 MG PO TABS: 0.5000 mg | ORAL_TABLET | Freq: Three times a day (TID) | ORAL | 1 refills | Status: DC

## 2020-06-21 MED ORDER — PRAMIPEXOLE DIHYDROCHLORIDE 0.125 MG PO TABS: ORAL_TABLET | ORAL | 0 refills | Status: DC

## 2020-06-21 NOTE — Patient Instructions (Addendum)
Start mirapex (pramipexole) as follows:  0.125 mg - 1 tablet three times per day for a week, then 2 tablets three times per day for a week and then fill the 0.5 mg tablet and take that, 1 pill three times per day  The physicians and staff at [facility] are committed to providing excellent care. You may receive a survey requesting feedback about your experience at our office. We strive to receive "very good" responses to the survey questions. If you feel that your experience would prevent you from giving the office a "very good " response, please contact our office to try to remedy the situation. We may be reached at [phone]. Thank you for taking the time out of your busy day to complete the survey.

## 2020-07-04 ENCOUNTER — Ambulatory Visit (INDEPENDENT_AMBULATORY_CARE_PROVIDER_SITE_OTHER): Payer: 59 | Admitting: Psychology

## 2020-07-04 DIAGNOSIS — F4323 Adjustment disorder with mixed anxiety and depressed mood: Secondary | ICD-10-CM

## 2020-08-01 ENCOUNTER — Ambulatory Visit (INDEPENDENT_AMBULATORY_CARE_PROVIDER_SITE_OTHER): Payer: 59 | Admitting: Psychology

## 2020-08-01 DIAGNOSIS — F4323 Adjustment disorder with mixed anxiety and depressed mood: Secondary | ICD-10-CM

## 2020-08-17 ENCOUNTER — Ambulatory Visit (INDEPENDENT_AMBULATORY_CARE_PROVIDER_SITE_OTHER): Payer: 59 | Admitting: Psychology

## 2020-08-17 DIAGNOSIS — F4323 Adjustment disorder with mixed anxiety and depressed mood: Secondary | ICD-10-CM | POA: Diagnosis not present

## 2020-08-22 ENCOUNTER — Other Ambulatory Visit: Payer: Self-pay | Admitting: Obstetrics and Gynecology

## 2020-08-22 DIAGNOSIS — N63 Unspecified lump in unspecified breast: Secondary | ICD-10-CM

## 2020-08-31 ENCOUNTER — Other Ambulatory Visit: Payer: Self-pay | Admitting: Nurse Practitioner

## 2020-08-31 ENCOUNTER — Ambulatory Visit: Payer: 59 | Admitting: Psychology

## 2020-08-31 ENCOUNTER — Ambulatory Visit (INDEPENDENT_AMBULATORY_CARE_PROVIDER_SITE_OTHER): Payer: 59 | Admitting: Psychology

## 2020-08-31 DIAGNOSIS — F4323 Adjustment disorder with mixed anxiety and depressed mood: Secondary | ICD-10-CM

## 2020-08-31 DIAGNOSIS — F418 Other specified anxiety disorders: Secondary | ICD-10-CM

## 2020-08-31 NOTE — Telephone Encounter (Signed)
Please see message and advise.  Thank you. Last OV 06*09/21 Last fill 03/16/20  #30/5

## 2020-09-14 ENCOUNTER — Ambulatory Visit (INDEPENDENT_AMBULATORY_CARE_PROVIDER_SITE_OTHER): Payer: 59 | Admitting: Psychology

## 2020-09-14 DIAGNOSIS — F4323 Adjustment disorder with mixed anxiety and depressed mood: Secondary | ICD-10-CM

## 2020-09-28 ENCOUNTER — Ambulatory Visit (INDEPENDENT_AMBULATORY_CARE_PROVIDER_SITE_OTHER): Payer: 59 | Admitting: Psychology

## 2020-09-28 DIAGNOSIS — F4323 Adjustment disorder with mixed anxiety and depressed mood: Secondary | ICD-10-CM | POA: Diagnosis not present

## 2020-10-05 ENCOUNTER — Other Ambulatory Visit: Payer: 59

## 2020-10-10 ENCOUNTER — Ambulatory Visit (INDEPENDENT_AMBULATORY_CARE_PROVIDER_SITE_OTHER): Payer: 59 | Admitting: Psychology

## 2020-10-10 DIAGNOSIS — F4323 Adjustment disorder with mixed anxiety and depressed mood: Secondary | ICD-10-CM | POA: Diagnosis not present

## 2020-10-24 ENCOUNTER — Ambulatory Visit (INDEPENDENT_AMBULATORY_CARE_PROVIDER_SITE_OTHER): Payer: 59 | Admitting: Psychology

## 2020-10-24 DIAGNOSIS — F4323 Adjustment disorder with mixed anxiety and depressed mood: Secondary | ICD-10-CM

## 2020-10-31 ENCOUNTER — Ambulatory Visit
Admission: RE | Admit: 2020-10-31 | Discharge: 2020-10-31 | Disposition: A | Discharge status: Home / Self Care | Payer: 59 | Source: Ambulatory Visit | Attending: Obstetrics and Gynecology | Admitting: Obstetrics and Gynecology

## 2020-10-31 ENCOUNTER — Other Ambulatory Visit: Payer: Self-pay

## 2020-10-31 DIAGNOSIS — N63 Unspecified lump in unspecified breast: Secondary | ICD-10-CM

## 2020-11-02 ENCOUNTER — Other Ambulatory Visit: Payer: 59

## 2020-11-09 NOTE — Progress Notes (Unsigned)
Assessment/Plan:   1.  Parkinsons Disease  -Continue pramipexole 0.5 mg 3 times per day.  R/B/SE were discussed.  The opportunity to ask questions was given and they were answered to the best of my ability.  The patient expressed understanding and willingness to follow the outlined treatment protocols.  -Discussed data on levodopa and starting it early.  She is really nervous about that, and I told her we could address it in the future.  2.  Depression/GAD  -increase lexapro, 20 mg.  R/B/SE were discussed.  The opportunity to ask questions was given and they were answered to the best of my ability.  The patient expressed understanding and willingness to follow the outlined treatment protocols.   Subjective:   Sara Nicholson was seen today in follow up for Parkinsons disease.  My previous records were reviewed prior to todays visit as well as outside records available to me.  Started pramipexole last visit.  No compulsive behaviors.  No sleep attacks.  Pt denies falls.  Pt denies lightheadedness, near syncope.  No hallucinations.  Mood has been somewhat angry and irritable.  Worries about her sister.  Was diagnosed with mild cognitive impairment, but that was prior to the pandemic and patient worries that it is something more than that.  She worries about taking care of her sister and how the best to do that.  Patient is getting ready to retire.  States that has gained weight due to eating ice cream and french fries.  She is loving body combat.  She is doing so well with it.  She can get off of the floor now and didn't used to be able to do that.    Current prescribed movement disorder medications: Pramipexole 0.5 mg 3 times per day (started last visit) - initially stopped tremoring with it but feels 100% better.     ALLERGIES:   Allergies  Allergen Reactions  . Terbinafine Hcl     CURRENT MEDICATIONS:  Outpatient Encounter Medications as of 11/11/2020  Medication Sig  . escitalopram  (LEXAPRO) 10 MG tablet Take 1 tablet (10 mg total) by mouth daily. Need office visit for additional refills  . pramipexole (MIRAPEX) 0.5 MG tablet Take 1 tablet (0.5 mg total) by mouth 3 (three) times daily. 8am/1pm/6pm  . pramipexole (MIRAPEX) 0.125 MG tablet 1 tablet three times per day for a week, then 2 po three times per day for a week   Facility-Administered Encounter Medications as of 11/11/2020  Medication  . 0.9 %  sodium chloride infusion    Objective:   PHYSICAL EXAMINATION:    VITALS:   Vitals:   11/11/20 1519  BP: (!) 150/88  Pulse: 90  Resp: 18  SpO2: 96%  Weight: 214 lb (97.1 kg)  Height: 5\' 4"  (1.626 m)    Wt Readings from Last 3 Encounters:  11/11/20 214 lb (97.1 kg)  06/21/20 207 lb (93.9 kg)  05/18/20 205 lb (93 kg)     GEN:  The patient appears stated age and is in NAD. HEENT:  Normocephalic, atraumatic.  The mucous membranes are moist. The superficial temporal arteries are without ropiness or tenderness. CV:  RRR Lungs:  CTAB Neck/HEME:  There are no carotid bruits bilaterally.  Neurological examination:  Orientation: The patient is alert and oriented x3. Cranial nerves: There is good facial symmetry without facial hypomimia. The speech is fluent and clear. Soft palate rises symmetrically and there is no tongue deviation. Hearing is intact to conversational tone.  Sensation: Sensation is intact to light touch throughout Motor: Strength is at least antigravity x4.  Movement examination: Tone: There is nltone in the UE/LE Abnormal movements: there is RUE/RLE rest tremor, mild Coordination:  There is no decremation with RAM's, with any form of RAMS, including alternating supination and pronation of the forearm, hand opening and closing, finger taps, heel taps and toe taps. Gait and Station: The patient has no difficulty arising out of a deep-seated chair without the use of the hands. The patient's stride length is good.    I have reviewed and  interpreted the following labs independently    Chemistry      Component Value Date/Time   NA 139 03/16/2020 1349   NA 142 04/07/2019 0822   K 3.9 03/16/2020 1349   CL 104 03/16/2020 1349   CO2 28 03/16/2020 1349   BUN 16 03/16/2020 1349   BUN 12 04/07/2019 0822   CREATININE 0.72 03/16/2020 1349      Component Value Date/Time   CALCIUM 9.7 03/16/2020 1349   ALKPHOS 100 03/16/2020 1349   AST 22 03/16/2020 1349   ALT 24 03/16/2020 1349   BILITOT 0.4 03/16/2020 1349   BILITOT 0.4 04/07/2019 0822       Lab Results  Component Value Date   WBC 6.8 03/16/2020   HGB 13.6 03/16/2020   HCT 40.3 03/16/2020   MCV 92.1 03/16/2020   PLT 243.0 03/16/2020    Lab Results  Component Value Date   TSH 1.33 03/16/2020     Total time spent on today's visit was 40 minutes, including both face-to-face time and nonface-to-face time.  Time included that spent on review of records (prior notes available to me/labs/imaging if pertinent), discussing treatment and goals, answering patient's questions and coordinating care.  Cc:  Nche, Charlene Brooke, NP

## 2020-11-11 ENCOUNTER — Encounter: Payer: Self-pay | Admitting: Neurology

## 2020-11-11 ENCOUNTER — Ambulatory Visit (INDEPENDENT_AMBULATORY_CARE_PROVIDER_SITE_OTHER): Payer: 59 | Admitting: Neurology

## 2020-11-11 ENCOUNTER — Other Ambulatory Visit: Payer: Self-pay

## 2020-11-11 VITALS — BP 150/88 | HR 90 | Resp 18 | Ht 64.0 in | Wt 214.0 lb

## 2020-11-11 DIAGNOSIS — F411 Generalized anxiety disorder: Secondary | ICD-10-CM

## 2020-11-11 DIAGNOSIS — G20 Parkinson's disease: Secondary | ICD-10-CM

## 2020-11-11 DIAGNOSIS — F330 Major depressive disorder, recurrent, mild: Secondary | ICD-10-CM | POA: Diagnosis not present

## 2020-11-11 MED ORDER — ESCITALOPRAM OXALATE 20 MG PO TABS: 20.0000 mg | ORAL_TABLET | Freq: Every day | ORAL | 0 refills | Status: DC

## 2020-11-11 NOTE — Patient Instructions (Signed)
Online Resources for Power over Parkinson's Group January 2022  . Local [place] Online Groups  o Power over Parkinson's Group :   - Power Over Parkinson's Patient Education Group will be Wednesday, January 12th at 2pm via Zoom.   - Upcoming Power over Parkinson's Meetings:  2nd Wednesdays of the month at 2 pm:       February 9th, March 9th - Contact Amy Marriott at [email or web address] if interested in participating in this online group o Parkinson's Care Partners Group:    3rd Mondays, Contact Sarah Chambers o Atypical Parkinsonian Patient Group:   4th Wednesdays, Contact Sarah Chambers o If you are interested in participating in these online groups with Sarah, please contact her directly for how to join those meetings.  Her contact information is [email or web address].  She will send you a link to join the Zoom meeting.  (Please note that Sarah Chambers , MSW, LCSW, has resigned her position at [facility], but will continue to lead the online groups temporarily)  . Parkinson Foundation:  www.parkinson.org o PD Health at Home continues:  Mindfulness Mondays, Expert Briefing Tuesdays, Wellness Wednesdays, Take Time Thursdays, Fitness Fridays -Listings for January 2022 are on the website o Upcoming Webinar:  Sights, Sounds, and Parkinson's.  Wednesday, November 09, 2020, @ 1 pm  o Please check out their website to sign up for emails and see their full online offerings  . [employer] Foundation:  www.michaeljfox.org  o Upcoming Webinar:   Diet, Exercise, and other Strategies for Living Well as you Age.  Thursday, October 29, 2020 @ 12 noon o Check out additional information on their website to see their full online offerings  . [employer] Phinney Foundation:  www.davisphinneyfoundation.org o Upcoming Webinar:  Emerging Therapies in Parkinson's with Dr. Michael Okun.  Wednesday, November 02, 2020 @ 2 pm o Care Partner Monthly Meetup.  With Connie Carpenter Phinney.  First  Tuesday of each month, 2 pm o Check out additional information to Live Well Today on their website  . Parkinson and Movement Disorders (PMD) Alliance:  www.pmdalliance.org o [employer]:  Online Education Events o Sign up for emails, which are sent weekly to give you updates on programming and online offerings  . Parkinson's Association of [place]:  www.parkinsonassociation.org o Information on online support groups, online exercises including Yoga, Parkinson's exercises and more-LOTS of information on links to PD resources and online events o Virtual Support Group through Parkinson's Association of [place]; next one is scheduled for Wednesday, November 09, 2020 at 2 pm. (These are typically scheduled for the 1st Wednesday of the month at 2 pm).  Visit website for details.  . Additional links for movement activities: o PWR! Moves Classes at [facility] HAD RESUMED (but are ON HOLD DUE TO COVID in January)  Contact Amy Marriott, PT [email or web address] or [phone] if interested o Here is a link to the PWR!Moves classes on Zoom from [place] Parkinson's Foundation - Daily Mon-Sat at 10:00. Via Zoom, FREE and open to all.  There is also a link below via Facebook if you use that platform. - https://www.parkinsonsmi.org/mpf-programs/exercise-and-movement-activities - https://www.facebook.com/ParkinsonsMI.org/posts/pwr-moves-exercise-class-parkinson-wellness-recovery-online-with-angee-ludwa-pt-/10156827878021813/ o Parkinson's Wellness Recovery (PWR! Moves)  www.pwr4life.org - Info on the PWR! Virtual Experience:  You will have access to our expertise through self-assessment, guided plans that start with the PD-specific fundamentals, educational content, tips, Q&A with an expert, and a growing library of PD-specific pre-recorded and live exercise classes of varying types and intensity - both physical and cognitive! If that is not   enough, we offer 1:1 wellness  consultations (in-person or virtual) to personalize your PWR! Virtual Experience.  - Check out the PWR! Move of the month on the Parkinson Wellness Recovery website:  https://www.pwr4life.org/pwr-move-of-the-month-4/ o Parkinson Foundation Fitness Fridays:  - As part of the PD Health @ Home program, this free video series focuses each week on one aspect of fitness designed to support people living with Parkinson's.  -  www.parkinson.org/understanding-parkinsons/coronavirus/PD-health-at-home/Fitness-Fridays o Dance for PD website is offering free, live-stream classes throughout the week, as well as links to digital library of classes:  https://danceforparkinsons.org/ o Dance for Parkinson's Class:  [employer] [place].  Free offering for people with Parkinson's and care partners; virtual class.  o For more information, contact [phone] or email Magalli Morana at [email or web address] o Virtual dance and Pilates for Parkinson's classes: Click on the Community Tab> Parkinson's Movement Initiative Tab.  To register for classes and for more information, visit www.americandancefestival.org and click the "community" tab.  o [employer] Parkinson's Cycling Classes  - [employer]: 1pm on Fridays-Live classes at [employer] (Contact Beth McKinney at [email or web address] or [phone]) - [employer]: Virtual Classes Mondays and Thursdays (contact Priscilla at [email or web address] or [phone])   o  [employer] - Three levels of classes are offered Tuesdays and Thursdays:  10:30 am,  12 noon & 1:45 pm at [employer] [facility]. To observe a class or for  more information, call [phone] or email [email or web address] - PD Flow Yoga for Parkinson's:  Fridays 1-2 pm, January 7-December 02, 2020 . [employer]: o Online Caregiver Education Opportunities:  www.well-springsolutions.org/caregiver-education/caregiver-support-group.  You  may also contact Jodi Kolada at [email or web address] or [phone].   o [employer] Powerful Tools for Caregivers, 6 week series designed to provide family and caregivers with practical tools to care for themselves and loved ones.  Wednesdays 10:15-12:15, beginning January 12th - Contact Jodi Kolada (above) for details o [employer] Navigator:  Just1Navigator program, a free service to help individuals and families through the journey of determining care for older adults.  The "Navigator" is a social worker, Nicole Reynolds, who will speak with a prospective client and/or loved ones to provide an assessment of the situation and a set of recommendations for a personalized care plan - all free of charge, and whether [employer] offers the needed service or not. If the need is not a service we provide, we are well-connected with reputable programs in town that we can refer you to.  www.well-springsolutions.org or to speak with the Navigator, call [phone].  

## 2020-11-16 ENCOUNTER — Ambulatory Visit (INDEPENDENT_AMBULATORY_CARE_PROVIDER_SITE_OTHER): Payer: 59 | Admitting: Psychology

## 2020-11-16 DIAGNOSIS — F4323 Adjustment disorder with mixed anxiety and depressed mood: Secondary | ICD-10-CM | POA: Diagnosis not present

## 2020-11-18 ENCOUNTER — Ambulatory Visit: Payer: 59 | Admitting: Neurology

## 2020-11-30 ENCOUNTER — Ambulatory Visit: Payer: 59 | Admitting: Psychology

## 2020-12-14 ENCOUNTER — Ambulatory Visit: Payer: 59 | Admitting: Psychology

## 2020-12-28 ENCOUNTER — Other Ambulatory Visit: Payer: Self-pay | Admitting: Neurology

## 2020-12-28 ENCOUNTER — Ambulatory Visit: Payer: 59 | Admitting: Psychology

## 2020-12-29 ENCOUNTER — Telehealth: Payer: Self-pay

## 2020-12-29 NOTE — Telephone Encounter (Signed)
Received a voicemail from the patient who states she needed a refill on her Mirapex.   Spoke with patient and made her aware that her medication has been sent to the pharmacy. She voiced understanding and stated the pharmacy gave her a call yesterday.

## 2021-01-16 ENCOUNTER — Encounter: Payer: Self-pay | Admitting: Family Medicine

## 2021-01-16 ENCOUNTER — Ambulatory Visit (INDEPENDENT_AMBULATORY_CARE_PROVIDER_SITE_OTHER): Payer: 59 | Admitting: Family Medicine

## 2021-01-16 ENCOUNTER — Other Ambulatory Visit: Payer: Self-pay

## 2021-01-16 VITALS — BP 150/86 | HR 69 | Temp 97.9°F | Ht 64.0 in | Wt 219.0 lb

## 2021-01-16 DIAGNOSIS — H6981 Other specified disorders of Eustachian tube, right ear: Secondary | ICD-10-CM | POA: Diagnosis not present

## 2021-01-16 DIAGNOSIS — G20 Parkinson's disease: Secondary | ICD-10-CM | POA: Diagnosis not present

## 2021-01-16 DIAGNOSIS — G20A1 Parkinson's disease without dyskinesia, without mention of fluctuations: Secondary | ICD-10-CM | POA: Insufficient documentation

## 2021-01-16 MED ORDER — TRIAMCINOLONE ACETONIDE 55 MCG/ACT NA AERO: 2.0000 | INHALATION_SPRAY | Freq: Every day | NASAL | 12 refills | Status: DC

## 2021-01-16 NOTE — Progress Notes (Signed)
[facility] [address] [place] [address] Dept: [phone] Dept Fax: [phone]  Office Visit  Subjective:    Patient ID: Sara Nicholson, female    DOB: 1956-07-02, 65 y.o..   MRN: [id number]  Chief Complaint  Patient presents with  . Acute Visit    C/o having RT ear pain and ST x 10 days.       History of Present Illness:  Patient is in today for a 10-day history of right ear pain and sore throat. She denies any fever, nasal congestion, or cough. She does have a sensation of post-nasal mucous drainage. She does not use tobacco. She has been taking ibuprofen for pain.  Sara Nicholson has a history of Parkinson's disease diagnosed last year. She notes that she is tolerating the mirapex, though it gave her drowsiness initially. There has been discussion of starting her on Sinemet, but she has not moved to this yet. She is followed by Dr. Carles Collet.  Past Medical History: Patient Active Problem List   Diagnosis Date Noted  . Parkinson's disease ([place]) 01/16/2021   Past Surgical History:  Procedure Laterality Date  . APPENDECTOMY    . BREAST BIOPSY Left   . BREAST EXCISIONAL BIOPSY Left   . BREAST SURGERY    . CESAREAN SECTION    . COLONOSCOPY     Family History  Problem Relation Age of Onset  . Heart attack Father   . Heart attack Brother   . Cancer Brother        Mass on L5  . Diabetes Maternal Grandmother   . Heart attack Maternal Grandfather   . Heart attack Paternal Grandfather   . Cancer Brother   . Lung cancer Brother   . Cancer - Lung Brother        mets to esophagus and brain  . Neurologic Disorder Sister 15       mild cognitive disorder  . Neuromuscular disorder Mother 53       unknown diagnosis  . Colon cancer Neg Hx   . Colon polyps Neg Hx   . Stomach cancer Neg Hx   . Rectal cancer Neg Hx    Outpatient Medications Prior to Visit  Medication Sig Dispense Refill  . escitalopram (LEXAPRO) 20 MG  tablet Take 1 tablet (20 mg total) by mouth daily. 90 tablet 0  . pramipexole (MIRAPEX) 0.5 MG tablet TAKE 1 TABLET(0.5 MG) BY MOUTH THREE TIMES DAILY AT 8 AM AND AT 1 PM AND AT 6 PM 270 tablet 0   Facility-Administered Medications Prior to Visit  Medication Dose Route Frequency Provider Last Rate Last Admin  . 0.9 %  sodium chloride infusion  500 mL Intravenous Once Doran Stabler, MD       Allergies  Allergen Reactions  . Terbinafine Hcl     Objective:   Today's Vitals   01/16/21 1458  BP: (!) 150/86  Pulse: 69  Temp: 97.9 F (36.6 C)  TempSrc: Temporal  SpO2: 99%  Weight: 219 lb (99.3 kg)  Height: 5\' 4"  (1.626 m)   Body mass index is 37.59 kg/m.   General: Well developed, well nourished. No acute distress. HEENT: Normocephalic, non-traumatic. External ears normal. Right EAC bulging with an apparent middle ear   effusion, but no redness and normal light reflex. Mucous membranes moist. Oropharynx shows mild streaking.   Good dentition. Neuro:Moderate tremor noted in right arm and right leg. No cogwheel rigidity noted. Psych: Alert  and oriented x3. Normal mood and affect.  Health Maintenance Due  Topic Date Due  . Hepatitis C Screening  Never done  . HIV Screening  Never done     Assessment & Plan:   1. Dysfunction of right eustachian tube There is an effusion, but no sign of infection for the middle ear. I recommend a course of nasal steroids to resolve this. I recommend hot tea with honey for th sore throat and PRN ibuprofen. Follow-up if not improving in 2-3 weeks.  - triamcinolone (NASACORT) 55 MCG/ACT AERO nasal inhaler; Place 2 sprays into the nose daily.  Dispense: 1 each; Refill: 12  2. Parkinson's disease ([place]) Stable on Mirapex. Patient is considering moving ahead with Sinemet treatment, but has concerns about side effects. She will continue to discuss with Dr. Carles Collet.  Haydee Salter, MD

## 2021-01-26 ENCOUNTER — Telehealth (INDEPENDENT_AMBULATORY_CARE_PROVIDER_SITE_OTHER): Payer: 59 | Admitting: Nurse Practitioner

## 2021-01-26 ENCOUNTER — Other Ambulatory Visit: Payer: Self-pay

## 2021-01-26 ENCOUNTER — Telehealth: Payer: Self-pay

## 2021-01-26 ENCOUNTER — Encounter: Payer: Self-pay | Admitting: Nurse Practitioner

## 2021-01-26 VITALS — Temp 98.0°F | Ht 64.0 in | Wt 215.0 lb

## 2021-01-26 DIAGNOSIS — H6981 Other specified disorders of Eustachian tube, right ear: Secondary | ICD-10-CM | POA: Diagnosis not present

## 2021-01-26 DIAGNOSIS — J029 Acute pharyngitis, unspecified: Secondary | ICD-10-CM | POA: Diagnosis not present

## 2021-01-26 DIAGNOSIS — J0140 Acute pansinusitis, unspecified: Secondary | ICD-10-CM

## 2021-01-26 MED ORDER — AMOXICILLIN 875 MG PO TABS: 875.0000 mg | ORAL_TABLET | Freq: Two times a day (BID) | ORAL | 0 refills | Status: AC

## 2021-01-26 MED ORDER — AMOXICILLIN 875 MG PO TABS: 875.0000 mg | ORAL_TABLET | Freq: Two times a day (BID) | ORAL | 0 refills | Status: DC

## 2021-01-26 MED ORDER — CETIRIZINE HCL 10 MG PO TABS: 10.0000 mg | ORAL_TABLET | Freq: Every day | ORAL | 0 refills | Status: DC

## 2021-01-26 MED ORDER — PREDNISONE 20 MG PO TABS: ORAL_TABLET | ORAL | 0 refills | Status: AC

## 2021-01-26 NOTE — Telephone Encounter (Signed)
Pt saw Dr Gena Fray on 4/11 for sore throat and ear pain. She has been using the nose spray as directed.  Pt states she hasn't improved only worsened. She said her sore throat is worse, her teeth hurt and she cannot sleep because of the pain.  She is asking if something can be called in for her. Pharmacy is [facility].  Thank you!

## 2021-01-26 NOTE — Telephone Encounter (Signed)
Please review message below and advise.   Thanks.  Dm/cma

## 2021-01-26 NOTE — Progress Notes (Signed)
Virtual Visit via Video Note  I connected with@ on 01/26/21 at  2:00 PM EDT by a video enabled telemedicine application and verified that I am speaking with the correct person using two identifiers.  Location: Patient:Home Provider: Office Participants: patient and provider  I discussed the limitations of evaluation and management by telemedicine and the availability of in person appointments. I also discussed with the patient that there may be a patient responsible charge related to this service. The patient expressed understanding and agreed to proceed.  CC:Pt c/o sore throat and right ear pain x 3 weeks. Pt states it is now hard to swallow. OTC medications did not help (tylenol, allergy meds, cough drops and nasal spray)  History of Present Illness: Sore Throat  This is a new problem. The current episode started 1 to 4 weeks ago. The problem has been gradually worsening. The pain is worse on the right side. There has been no fever. The pain is severe. Associated symptoms include congestion, ear pain, a plugged ear sensation, swollen glands and trouble swallowing. Pertinent negatives include no abdominal pain, coughing, diarrhea, drooling, ear discharge, headaches, hoarse voice, neck pain, shortness of breath, stridor or vomiting. She has had no exposure to strep or mono. She has tried acetaminophen and gargles for the symptoms. The treatment provided no relief.  denies any recent travel, no known sick contact, has not been in any large gathering.  Observations/Objective: Physical Exam HENT:     Head:     Jaw: No trismus.     Right Ear: External ear normal.     Left Ear: External ear normal.     Mouth/Throat:     Pharynx: Uvula midline. Posterior oropharyngeal erythema present. No oropharyngeal exudate or uvula swelling.     Tonsils: No tonsillar exudate.  Eyes:     Extraocular Movements: Extraocular movements intact.     Conjunctiva/sclera: Conjunctivae normal.  Pulmonary:      Effort: Pulmonary effort is normal. No respiratory distress.  Musculoskeletal:     Cervical back: Normal range of motion and neck supple.  Neurological:     Mental Status: She is alert and oriented to person, place, and time.    Assessment and Plan: Sara Nicholson was seen today for acute visit.  Diagnoses and all orders for this visit:  Acute non-recurrent pansinusitis -     predniSONE (DELTASONE) 20 MG tablet; Take 1.5 tablets (30 mg total) by mouth daily with breakfast for 2 days, THEN 1 tablet (20 mg total) daily with breakfast for 2 days. -     cetirizine (ZYRTEC) 10 MG tablet; Take 1 tablet (10 mg total) by mouth at bedtime. -     Discontinue: amoxicillin (AMOXIL) 875 MG tablet; Take 1 tablet (875 mg total) by mouth 2 (two) times daily for 10 days. -     amoxicillin (AMOXIL) 875 MG tablet; Take 1 tablet (875 mg total) by mouth 2 (two) times daily for 7 days.  Dysfunction of right eustachian tube -     predniSONE (DELTASONE) 20 MG tablet; Take 1.5 tablets (30 mg total) by mouth daily with breakfast for 2 days, THEN 1 tablet (20 mg total) daily with breakfast for 2 days. -     cetirizine (ZYRTEC) 10 MG tablet; Take 1 tablet (10 mg total) by mouth at bedtime. -     Discontinue: amoxicillin (AMOXIL) 875 MG tablet; Take 1 tablet (875 mg total) by mouth 2 (two) times daily for 10 days. -     amoxicillin (AMOXIL)  875 MG tablet; Take 1 tablet (875 mg total) by mouth 2 (two) times daily for 7 days.  Acute pharyngitis, unspecified etiology -     predniSONE (DELTASONE) 20 MG tablet; Take 1.5 tablets (30 mg total) by mouth daily with breakfast for 2 days, THEN 1 tablet (20 mg total) daily with breakfast for 2 days. -     Discontinue: amoxicillin (AMOXIL) 875 MG tablet; Take 1 tablet (875 mg total) by mouth 2 (two) times daily for 10 days. -     amoxicillin (AMOXIL) 875 MG tablet; Take 1 tablet (875 mg total) by mouth 2 (two) times daily for 7 days.   Follow Up Instructions: See above   I discussed  the assessment and treatment plan with the patient. The patient was provided an opportunity to ask questions and all were answered. The patient agreed with the plan and demonstrated an understanding of the instructions.   The patient was advised to call back or seek an in-person evaluation if the symptoms worsen or if the condition fails to improve as anticipated.  Wilfred Lacy, NP

## 2021-01-26 NOTE — Telephone Encounter (Signed)
Spoke to patient and scheduled her with Baldo Ash today @ 2 pm.  Dm/cma

## 2021-02-23 ENCOUNTER — Telehealth: Payer: Self-pay

## 2021-02-23 MED ORDER — PRAMIPEXOLE DIHYDROCHLORIDE 0.5 MG PO TABS: ORAL_TABLET | ORAL | 1 refills | Status: DC

## 2021-02-23 NOTE — Telephone Encounter (Signed)
Refill request received: Please refill Pramipexole .5 mg.  Last refill sent 12/2020 with no refills unsure why. Patient last seen 11/2020.    Please advise.

## 2021-03-28 DIAGNOSIS — D225 Melanocytic nevi of trunk: Secondary | ICD-10-CM | POA: Diagnosis not present

## 2021-03-28 DIAGNOSIS — D2272 Melanocytic nevi of left lower limb, including hip: Secondary | ICD-10-CM | POA: Diagnosis not present

## 2021-03-28 DIAGNOSIS — D2262 Melanocytic nevi of left upper limb, including shoulder: Secondary | ICD-10-CM | POA: Diagnosis not present

## 2021-03-28 DIAGNOSIS — L821 Other seborrheic keratosis: Secondary | ICD-10-CM | POA: Diagnosis not present

## 2021-03-28 DIAGNOSIS — D1801 Hemangioma of skin and subcutaneous tissue: Secondary | ICD-10-CM | POA: Diagnosis not present

## 2021-03-28 DIAGNOSIS — L738 Other specified follicular disorders: Secondary | ICD-10-CM | POA: Diagnosis not present

## 2021-03-28 DIAGNOSIS — L820 Inflamed seborrheic keratosis: Secondary | ICD-10-CM | POA: Diagnosis not present

## 2021-03-28 DIAGNOSIS — D2261 Melanocytic nevi of right upper limb, including shoulder: Secondary | ICD-10-CM | POA: Diagnosis not present

## 2021-03-28 DIAGNOSIS — L720 Epidermal cyst: Secondary | ICD-10-CM | POA: Diagnosis not present

## 2021-03-28 DIAGNOSIS — D235 Other benign neoplasm of skin of trunk: Secondary | ICD-10-CM | POA: Diagnosis not present

## 2021-03-28 DIAGNOSIS — Z85828 Personal history of other malignant neoplasm of skin: Secondary | ICD-10-CM | POA: Diagnosis not present

## 2021-03-28 DIAGNOSIS — D2239 Melanocytic nevi of other parts of face: Secondary | ICD-10-CM | POA: Diagnosis not present

## 2021-04-05 ENCOUNTER — Ambulatory Visit: Payer: 59 | Admitting: Obstetrics and Gynecology

## 2021-04-05 ENCOUNTER — Ambulatory Visit (INDEPENDENT_AMBULATORY_CARE_PROVIDER_SITE_OTHER): Payer: Medicare Other | Admitting: Obstetrics and Gynecology

## 2021-04-05 ENCOUNTER — Encounter: Payer: Self-pay | Admitting: Obstetrics and Gynecology

## 2021-04-05 ENCOUNTER — Other Ambulatory Visit: Payer: Self-pay

## 2021-04-05 VITALS — BP 138/74 | HR 74 | Ht 64.0 in | Wt 218.0 lb

## 2021-04-05 DIAGNOSIS — N3946 Mixed incontinence: Secondary | ICD-10-CM | POA: Diagnosis not present

## 2021-04-05 DIAGNOSIS — Z01419 Encounter for gynecological examination (general) (routine) without abnormal findings: Secondary | ICD-10-CM | POA: Diagnosis not present

## 2021-04-05 LAB — URINALYSIS, COMPLETE
Bacteria, UA: NONE SEEN /HPF
Bilirubin Urine: NEGATIVE
Glucose, UA: NEGATIVE
Hyaline Cast: NONE SEEN /LPF
Ketones, ur: NEGATIVE
Leukocytes,Ua: NEGATIVE
Nitrite: NEGATIVE
Protein, ur: NEGATIVE
Specific Gravity, Urine: 1.025 (ref 1.001–1.035)
Squamous Epithelial / HPF: NONE SEEN /HPF (ref <=5)
WBC, UA: NONE SEEN /HPF (ref 0–5)
pH: 5 (ref 5.0–8.0)

## 2021-04-05 NOTE — Patient Instructions (Addendum)
EXERCISE   We recommended that you start or continue a regular exercise program for good health. Physical activity is anything that gets your body moving, some is better than none. The CDC recommends 150 minutes per week of Moderate-Intensity Aerobic Activity and 2 or more days of Muscle Strengthening Activity.  Benefits of exercise are limitless: helps weight loss/weight maintenance, improves mood and energy, helps with depression and anxiety, improves sleep, tones and strengthens muscles, improves balance, improves bone density, protects from chronic conditions such as heart disease, high blood pressure and diabetes and so much more. To learn more visit: https://www.cdc.gov/physicalactivity/index.html  DIET: Good nutrition starts with a healthy diet of fruits, vegetables, whole grains, and lean protein sources. Drink plenty of water for hydration. Minimize empty calories, sodium, sweets. For more information about dietary recommendations visit: https://health.gov/our-work/nutrition-physical-activity/dietary-guidelines and https://www.myplate.gov/  ALCOHOL:  Women should limit their alcohol intake to no more than 7 drinks/beers/glasses of wine (combined, not each!) per week. Moderation of alcohol intake to this level decreases your risk of breast cancer and liver damage.  If you are concerned that you may have a problem, or your friends have told you they are concerned about your drinking, there are many resources to help. A well-known program that is free, effective, and available to all people all over the nation is Alcoholics Anonymous.  Check out this site to learn more: https://www.aa.org/   CALCIUM AND VITAMIN D:  Adequate intake of calcium and Vitamin D are recommended for bone health.  You should be getting between 1000-1200 mg of calcium and 800 units of Vitamin D daily between diet and supplements  PAP SMEARS:  Pap smears, to check for cervical cancer or precancers,  have traditionally been  done yearly, scientific advances have shown that most women can have pap smears less often.  However, every woman still should have a physical exam from her gynecologist every year. It will include a breast check, inspection of the vulva and vagina to check for abnormal growths or skin changes, a visual exam of the cervix, and then an exam to evaluate the size and shape of the uterus and ovaries. We will also provide age appropriate advice regarding health maintenance, like when you should have certain vaccines, screening for sexually transmitted diseases, bone density testing, colonoscopy, mammograms, etc.   MAMMOGRAMS:  All women over 40 years old should have a routine mammogram.   COLON CANCER SCREENING: Now recommend starting at age 45. At this time colonoscopy is not covered for routine screening until 50. There are take home tests that can be done between 45-49.   COLONOSCOPY:  Colonoscopy to screen for colon cancer is recommended for all women at age 50.  We know, you hate the idea of the prep.  We agree, BUT, having colon cancer and not knowing it is worse!!  Colon cancer so often starts as a polyp that can be seen and removed at colonscopy, which can quite literally save your life!  And if your first colonoscopy is normal and you have no family history of colon cancer, most women don't have to have it again for 10 years.  Once every ten years, you can do something that may end up saving your life, right?  We will be happy to help you get it scheduled when you are ready.  Be sure to check your insurance coverage so you understand how much it will cost.  It may be covered as a preventative service at no cost, but you should check   your particular policy.      Breast Self-Awareness Breast self-awareness means being familiar with how your breasts look and feel. It involves checking your breasts regularly and reporting any changes to your health care provider. Practicing breast self-awareness is  important. A change in your breasts can be a sign of a serious medical problem. Being familiar with how your breasts look and feel allows you to find any problems early, when treatment is more likely to be successful. All women should practice breast self-awareness, including women who have had breast implants. How to do a breast self-exam One way to learn what is normal for your breasts and whether your breasts are changing is to do a breast self-exam. To do a breast self-exam: Look for Changes  Remove all the clothing above your waist. Stand in front of a mirror in a room with good lighting. Put your hands on your hips. Push your hands firmly downward. Compare your breasts in the mirror. Look for differences between them (asymmetry), such as: Differences in shape. Differences in size. Puckers, dips, and bumps in one breast and not the other. Look at each breast for changes in your skin, such as: Redness. Scaly areas. Look for changes in your nipples, such as: Discharge. Bleeding. Dimpling. Redness. A change in position. Feel for Changes Carefully feel your breasts for lumps and changes. It is best to do this while lying on your back on the floor and again while sitting or standing in the shower or tub with soapy water on your skin. Feel each breast in the following way: Place the arm on the side of the breast you are examining above your head. Feel your breast with the other hand. Start in the nipple area and make  inch (2 cm) overlapping circles to feel your breast. Use the pads of your three middle fingers to do this. Apply light pressure, then medium pressure, then firm pressure. The light pressure will allow you to feel the tissue closest to the skin. The medium pressure will allow you to feel the tissue that is a little deeper. The firm pressure will allow you to feel the tissue close to the ribs. Continue the overlapping circles, moving downward over the breast until you feel your  ribs below your breast. Move one finger-width toward the center of the body. Continue to use the  inch (2 cm) overlapping circles to feel your breast as you move slowly up toward your collarbone. Continue the up and down exam using all three pressures until you reach your armpit.  Write Down What You Find  Write down what is normal for each breast and any changes that you find. Keep a written record with breast changes or normal findings for each breast. By writing this information down, you do not need to depend only on memory for size, tenderness, or location. Write down where you are in your menstrual cycle, if you are still menstruating. If you are having trouble noticing differences in your breasts, do not get discouraged. With time you will become more familiar with the variations in your breasts and more comfortable with the exam. How often should I examine my breasts? Examine your breasts every month. If you are breastfeeding, the best time to examine your breasts is after a feeding or after using a breast pump. If you menstruate, the best time to examine your breasts is 5-7 days after your period is over. During your period, your breasts are lumpier, and it may be more   difficult to notice changes. When should I see my health care provider? See your health care provider if you notice: A change in shape or size of your breasts or nipples. A change in the skin of your breast or nipples, such as a reddened or scaly area. Unusual discharge from your nipples. A lump or thick area that was not there before. Pain in your breasts. Anything that concerns you. Urinary Incontinence  Urinary incontinence refers to a condition in which a person is unable to control where and when to pass urine. A person with this condition will urinate when he or she does not mean to (involuntarily). What are the causes? This condition may be caused by: Medicines. Infections. Constipation. Overactive bladder  muscles. Weak bladder muscles. Weak pelvic floor muscles. These muscles provide support for the bladder, intestine, and, in women, the uterus. Enlarged prostate in men. The prostate is a gland near the bladder. When it gets too big, it can pinch the urethra. With the urethra blocked, the bladder can weaken and lose the ability to empty properly. Surgery. Emotional factors, such as anxiety, stress, or post-traumatic stress disorder (PTSD). Pelvic organ prolapse. This happens in women when organs shift out of place and into the vagina. This shift can prevent the bladder and urethra from working properly. What increases the risk? The following factors may make you more likely to develop this condition: Older age. Obesity and physical inactivity. Pregnancy and childbirth. Menopause. Diseases that affect the nerves or spinal cord (neurological diseases). Long-term (chronic) coughing. This can increase pressure on the bladder and pelvic floor muscles. What are the signs or symptoms? Symptoms may vary depending on the type of urinary incontinence you have. They include: A sudden urge to urinate, but passing urine involuntarily before you can get to a bathroom (urge incontinence). Suddenly passing urine with any activity that forces urine to pass, such as coughing, laughing, exercise, or sneezing (stress incontinence). Needing to urinate often, but urinating only a small amount, or constantly dribbling urine (overflow incontinence). Urinating because you cannot get to the bathroom in time due to a physical disability, such as arthritis or injury, or communication and thinking problems, such as Alzheimer disease (functional incontinence). How is this diagnosed? This condition may be diagnosed based on: Your medical history. A physical exam. Tests, such as: Urine tests. X-rays of your kidney and bladder. Ultrasound. CT scan. Cystoscopy. In this procedure, a health care provider inserts a tube  with a light and camera (cystoscope) through the urethra and into the bladder in order to check for problems. Urodynamic testing. These tests assess how well the bladder, urethra, and sphincter can store and release urine. There are different types of urodynamic tests, and they vary depending on what the test is measuring. To help diagnose your condition, your health care provider may recommend thatyou keep a log of when you urinate and how much you urinate. How is this treated? Treatment for this condition depends on the type of incontinence that you have and its cause. Treatment may include: Lifestyle changes, such as: Quitting smoking. Maintaining a healthy weight. Staying active. Try to get 150 minutes of moderate-intensity exercise every week. Ask your health care provider which activities are safe for you. Eating a healthy diet. Avoid high-fat foods, like fried foods. Avoid refined carbohydrates like white bread and white rice. Limit how much alcohol and caffeine you drink. Increase your fiber intake. Foods such as fresh fruits, vegetables, beans, and whole grains are healthy sources of  fiber. Pelvic floor muscle exercises. Bladder training, such as lengthening the amount of time between bathroom breaks, or using the bathroom at regular intervals. Using techniques to suppress bladder urges. This can include distraction techniques or controlled breathing exercises. Medicines to relax the bladder muscles and prevent bladder spasms. Medicines to help slow or prevent the growth of a man's prostate. Botox injections. These can help relax the bladder muscles. Using pulses of electricity to help change bladder reflexes (electrical nerve stimulation). For women, using a medical device to prevent urine leaks. This is a small, tampon-like, disposable device that is inserted into the urethra. Injecting collagen or carbon beads (bulking agents) into the urinary sphincter. These can help thicken  tissue and close the bladder opening. Surgery. Follow these instructions at home: Lifestyle Limit alcohol and caffeine. These can fill your bladder quickly and irritate it. Keep yourself clean to help prevent odors and skin damage. Ask your doctor about special skin creams and cleansers that can protect the skin from urine. Consider wearing pads or adult diapers. Make sure to change them regularly, and always change them right after experiencing incontinence. General instructions Take over-the-counter and prescription medicines only as told by your health care provider. Use the bathroom about every 3-4 hours, even if you do not feel the need to urinate. Try to empty your bladder completely every time. After urinating, wait a minute. Then try to urinate again. Make sure you are in a relaxed position while urinating. If your incontinence is caused by nerve problems, keep a log of the medicines you take and the times you go to the bathroom. Keep all follow-up visits as told by your health care provider. This is important. Contact a health care provider if: You have pain that gets worse. Your incontinence gets worse. Get help right away if: You have a fever or chills. You are unable to urinate. You have redness in your groin area or down your legs. Summary Urinary incontinence refers to a condition in which a person is unable to control where and when to pass urine. This condition may be caused by medicines, infection, weak bladder muscles, weak pelvic floor muscles, enlargement of the prostate (in men), or surgery. The following factors increase your risk for developing this condition: older age, obesity, pregnancy and childbirth, menopause, neurological diseases, and chronic coughing. There are several types of urinary incontinence. They include urge incontinence, stress incontinence, overflow incontinence, and functional incontinence. This condition is usually treated first with lifestyle and  behavioral changes, such as quitting smoking, eating a healthier diet, and doing regular pelvic floor exercises. Other treatment options include medicines, bulking agents, medical devices, electrical nerve stimulation, or surgery. This information is not intended to replace advice given to you by your health care provider. Make sure you discuss any questions you have with your healthcare provider. Document Revised: 10/04/2017 Document Reviewed: 01/03/2017 Elsevier Patient Education  [place]. Kegel Exercises  Kegel exercises can help strengthen your pelvic floor muscles. The pelvic floor is a group of muscles that support your rectum, small intestine, and bladder. In females, pelvic floor muscles also help support the womb (uterus). These muscles help you control the flow of urine and stool. Kegel exercises are painless and simple, and they do not require any equipment. Your provider may suggest Kegel exercises to: Improve bladder and bowel control. Improve sexual response. Improve weak pelvic floor muscles after surgery to remove the uterus (hysterectomy) or pregnancy (females). Improve weak pelvic floor muscles after prostate gland  removal or surgery (males). Kegel exercises involve squeezing your pelvic floor muscles, which are the same muscles you squeeze when you try to stop the flow of urine or keep from passing gas. The exercises can be done while sitting, standing, or lying down, but itis best to vary your position. Exercises How to do Kegel exercises: Squeeze your pelvic floor muscles tight. You should feel a tight lift in your rectal area. If you are a female, you should also feel a tightness in your vaginal area. Keep your stomach, buttocks, and legs relaxed. Hold the muscles tight for up to 10 seconds. Breathe normally. Relax your muscles. Repeat as told by your health care provider. Repeat this exercise daily as told by your health care provider. Continue to do this  exercise for at least 4-6 weeks, or for as long as told by your healthcare provider. You may be referred to a physical therapist who can help you learn more abouthow to do Kegel exercises. Depending on your condition, your health care provider may recommend: Varying how long you squeeze your muscles. Doing several sets of exercises every day. Doing exercises for several weeks. Making Kegel exercises a part of your regular exercise routine. This information is not intended to replace advice given to you by your health care provider. Make sure you discuss any questions you have with your healthcare provider. Document Revised: 09/14/2020 Document Reviewed: 05/14/2018 Elsevier Patient Education  [place].

## 2021-04-05 NOTE — Progress Notes (Signed)
65 y.o. [id number] Married White or Caucasian Not Hispanic or Latino female here for annual exam.  No vaginal bleeding.  Some urge incontinence, just on occasion but can leak a large amount. Worsened in the last year.  She does notice a worsening of urinary urgency when she drinks caffeine. In addition she has mild GSI, will leak a small amount with valsalva (happens during exercise).   She was diagnosed with Parkinson's disease since her last visit here. She struggled with the diagnosis for ~6 months, since then she has been able to put the stress behind her. She is working out every day.  She has good support. Seeing a therapist.   Sister with possible early dementia.   Patient's last menstrual period was 06/08/2010.          Sexually active: Yes.    The current method of family planning is post menopausal status.    Exercising: Yes.     [place] [employer]  Smoker:  no  Health Maintenance: Pap:   04/04/20 WNL Hr HPV Neg  03-21-17 ASCUS NEG HR HPV             12-16-12 WNL NEG HR HPV History of abnormal Pap:  yes had laser surgery in the early 80"s MMG:  11/01/20 Incomplete : [place] Left Breast Bi-rads 2 benign  BMD:   None  Colonoscopy: 06/02/18 normal, F/U 10 years  TDaP:  03/08/18  Gardasil: N/A   reports that she has never smoked. She has never used smokeless tobacco. She reports that she does not drink alcohol and does not use drugs. She retired at the end of April. [facility] [place] [facility] at [facility]. Daughter is living at home, saving to buy a house, works in Engineer, technical sales.   Past Medical History:  Diagnosis Date   Depression     Past Surgical History:  Procedure Laterality Date   APPENDECTOMY     BREAST BIOPSY Left    BREAST EXCISIONAL BIOPSY Left    BREAST SURGERY     CESAREAN SECTION     COLONOSCOPY      Current Outpatient Medications  Medication Sig Dispense Refill   escitalopram (LEXAPRO) 20 MG tablet Take 1 tablet (20 mg total) by mouth daily. 90 tablet 0   pramipexole  (MIRAPEX) 0.5 MG tablet TAKE 1 TABLET AT 8 AM, PM,  6 PM 270 tablet 1   Current Facility-Administered Medications  Medication Dose Route Frequency Provider Last Rate Last Admin   0.9 %  sodium chloride infusion  500 mL Intravenous Once Doran Stabler, MD        Family History  Problem Relation Age of Onset   Heart attack Father    Heart attack Brother    Cancer Brother        Mass on L5   Diabetes Maternal Grandmother    Heart attack Maternal Grandfather    Heart attack Paternal Grandfather    Cancer Brother    Lung cancer Brother    Cancer - Lung Brother        mets to esophagus and brain   Neurologic Disorder Sister 53       mild cognitive disorder   Neuromuscular disorder Mother 45       unknown diagnosis   Colon cancer Neg Hx    Colon polyps Neg Hx    Stomach cancer Neg Hx    Rectal cancer Neg Hx     Review of Systems  All  other systems reviewed and are negative.  Exam:   BP 138/74   Pulse 74   Ht 5\' 4"  (1.626 m)   Wt 218 lb (98.9 kg)   LMP 06/08/2010   SpO2 100%   BMI 37.42 kg/m   Weight change: @WEIGHTCHANGE @ Height:   Height: 5\' 4"  (162.6 cm)  Ht Readings from Last 3 Encounters:  04/05/21 5\' 4"  (1.626 m)  01/26/21 5\' 4"  (1.626 m)  01/16/21 5\' 4"  (1.626 m)    General appearance: alert, cooperative and appears stated age Head: Normocephalic, without obvious abnormality, atraumatic Neck: no adenopathy, supple, symmetrical, trachea midline and thyroid normal to inspection and palpation Breasts: normal appearance, no masses or tenderness Abdomen: soft, non-tender; non distended,  no masses,  no organomegaly Extremities: extremities normal, atraumatic, no cyanosis or edema Skin: Skin color, texture, turgor normal. No rashes or lesions Lymph nodes: Cervical, supraclavicular, and axillary nodes normal. No abnormal inguinal nodes palpated Neurologic: Grossly normal   Pelvic: External genitalia:  no lesions              Urethra:  normal appearing urethra  with no masses, tenderness or lesions              Bartholins and Skenes: normal                 Vagina: normal appearing vagina with normal color and discharge, no lesions              Cervix: no lesions               Bimanual Exam:  Uterus:   no masses or tenderness              Adnexa: no mass, fullness, tenderness               Rectovaginal: Confirms               Anus:  normal sphincter tone, no lesions  Wandra Scot, CMA chaperoned for the exam.  1. Encounter for gynecological examination without abnormal finding Discussed breast self exam Discussed calcium and vit D intake No pap this year Mammogram and colonoscopy UTD  2. Mixed incontinence -Kegel information given - Ambulatory referral to Physical Therapy - Urine Culture - Urinalysis, Complete

## 2021-04-06 LAB — URINE CULTURE
MICRO NUMBER:: 12064849
Result:: NO GROWTH
SPECIMEN QUALITY:: ADEQUATE

## 2021-04-07 ENCOUNTER — Telehealth: Payer: Self-pay | Admitting: *Deleted

## 2021-04-07 NOTE — Telephone Encounter (Signed)
-----   Message from Salvadore Dom, MD sent at 04/05/2021 12:47 PM EDT ----- [facility]. Thanks, Sharee Pimple

## 2021-04-07 NOTE — Telephone Encounter (Signed)
Office notes faxed to [facility] for PT with Ileana Roup, they will call to schedule.

## 2021-04-12 DIAGNOSIS — G20 Parkinson's disease: Secondary | ICD-10-CM | POA: Diagnosis not present

## 2021-04-12 DIAGNOSIS — R350 Frequency of micturition: Secondary | ICD-10-CM | POA: Diagnosis not present

## 2021-04-26 DIAGNOSIS — R350 Frequency of micturition: Secondary | ICD-10-CM | POA: Diagnosis not present

## 2021-04-26 DIAGNOSIS — G20 Parkinson's disease: Secondary | ICD-10-CM | POA: Diagnosis not present

## 2021-05-02 NOTE — Telephone Encounter (Signed)
Patient was seen on 04/12/21 with Dr. Jake Bathe

## 2021-05-03 NOTE — Progress Notes (Signed)
Assessment/Plan:   1.  Parkinsons Disease  -Continue pramipexole 0.5 mg 3 times per day.  R/B/SE were discussed.  The opportunity to ask questions was given and they were answered to the best of my ability.  The patient expressed understanding and willingness to follow the outlined treatment protocols.  -Discussed data on levodopa and starting it early.  She is really nervous about that, and I told her we could address it in the future.  -following with Amy Martinique for dermatology  2.  Depression/GAD  -continue lexapro, 20 mg.  Doing better with increased dosage.  R/B/SE were discussed.  The opportunity to ask questions was given and they were answered to the best of my ability.  The patient expressed understanding and willingness to follow the outlined treatment protocols.   Subjective:   Sara Nicholson was seen today in follow up for Parkinsons disease.  My previous records were reviewed prior to todays visit as well as outside records available to me.  No falls.  No lightheadedness or near syncope.  We did increase her Lexapro last visit and reports that she can "notice a huge difference."  She is retired now.  She retired 4/30.  She is working out daily - she is using the [employer].  She can get off the floor easily now.    Current prescribed movement disorder medications: Pramipexole 0.5 mg 3 times per day  Lexapro, 20 mg daily (increased last visit)  ALLERGIES:   Allergies  Allergen Reactions   Terbinafine Hcl     CURRENT MEDICATIONS:  Outpatient Encounter Medications as of 05/05/2021  Medication Sig   escitalopram (LEXAPRO) 20 MG tablet Take 1 tablet (20 mg total) by mouth daily.   pramipexole (MIRAPEX) 0.5 MG tablet TAKE 1 TABLET AT 8 AM, PM,  6 PM   Facility-Administered Encounter Medications as of 05/05/2021  Medication   0.9 %  sodium chloride infusion    Objective:   PHYSICAL EXAMINATION:    VITALS:   Vitals:   05/05/21 0908  BP: (!) 146/76  Pulse:  78  SpO2: 97%  Weight: 214 lb 3.2 oz (97.2 kg)  Height: '5\' 4"'$  (1.626 m)     Wt Readings from Last 3 Encounters:  05/05/21 214 lb 3.2 oz (97.2 kg)  04/05/21 218 lb (98.9 kg)  01/26/21 215 lb (97.5 kg)     GEN:  The patient appears stated age and is in NAD. HEENT:  Normocephalic, atraumatic.  The mucous membranes are moist. The superficial temporal arteries are without ropiness or tenderness. CV:  RRR Lungs:  CTAB Neck/HEME:  There are no carotid bruits bilaterally.  Neurological examination:  Orientation: The patient is alert and oriented x3. Cranial nerves: There is good facial symmetry without facial hypomimia. The speech is fluent and clear. Soft palate rises symmetrically and there is no tongue deviation. Hearing is intact to conversational tone. Sensation: Sensation is intact to light touch throughout Motor: Strength is at least antigravity x4.  Movement examination: Tone: There is nl tone in the UE/LE Abnormal movements: there is RUE/RLE rest tremor, mild Coordination:  There is no decremation with RAM's, with any form of RAMS, including alternating supination and pronation of the forearm, hand opening and closing, finger taps, heel taps and toe taps. Gait and Station: The patient has no difficulty arising out of a deep-seated chair without the use of the hands. The patient's stride length is good.    I have reviewed and interpreted the following labs  independently    Chemistry      Component Value Date/Time   NA 139 03/16/2020 1349   NA 142 04/07/2019 0822   K 3.9 03/16/2020 1349   CL 104 03/16/2020 1349   CO2 28 03/16/2020 1349   BUN 16 03/16/2020 1349   BUN 12 04/07/2019 0822   CREATININE 0.72 03/16/2020 1349      Component Value Date/Time   CALCIUM 9.7 03/16/2020 1349   ALKPHOS 100 03/16/2020 1349   AST 22 03/16/2020 1349   ALT 24 03/16/2020 1349   BILITOT 0.4 03/16/2020 1349   BILITOT 0.4 04/07/2019 0822       Lab Results  Component Value Date    WBC 6.8 03/16/2020   HGB 13.6 03/16/2020   HCT 40.3 03/16/2020   MCV 92.1 03/16/2020   PLT 243.0 03/16/2020    Lab Results  Component Value Date   TSH 1.33 03/16/2020     Total time spent on today's visit was 25 minutes, including both face-to-face time and nonface-to-face time.  Time included that spent on review of records (prior notes available to me/labs/imaging if pertinent), discussing treatment and goals, answering patient's questions and coordinating care.  Cc:  Nche, Charlene Brooke, NP

## 2021-05-05 ENCOUNTER — Other Ambulatory Visit: Payer: Self-pay

## 2021-05-05 ENCOUNTER — Ambulatory Visit: Payer: Medicare Other | Admitting: Neurology

## 2021-05-05 ENCOUNTER — Encounter: Payer: Self-pay | Admitting: Neurology

## 2021-05-05 VITALS — BP 146/76 | HR 78 | Ht 64.0 in | Wt 214.2 lb

## 2021-05-05 DIAGNOSIS — G20 Parkinson's disease: Secondary | ICD-10-CM | POA: Diagnosis not present

## 2021-05-05 MED ORDER — PRAMIPEXOLE DIHYDROCHLORIDE 0.5 MG PO TABS: ORAL_TABLET | ORAL | 1 refills | Status: DC

## 2021-05-05 MED ORDER — ESCITALOPRAM OXALATE 20 MG PO TABS: 20.0000 mg | ORAL_TABLET | Freq: Every day | ORAL | 1 refills | Status: DC

## 2021-05-17 DIAGNOSIS — R350 Frequency of micturition: Secondary | ICD-10-CM | POA: Diagnosis not present

## 2021-05-17 DIAGNOSIS — G20 Parkinson's disease: Secondary | ICD-10-CM | POA: Diagnosis not present

## 2021-06-07 DIAGNOSIS — G20 Parkinson's disease: Secondary | ICD-10-CM | POA: Diagnosis not present

## 2021-06-07 DIAGNOSIS — R350 Frequency of micturition: Secondary | ICD-10-CM | POA: Diagnosis not present

## 2021-07-04 DIAGNOSIS — G20 Parkinson's disease: Secondary | ICD-10-CM | POA: Diagnosis not present

## 2021-07-04 DIAGNOSIS — R350 Frequency of micturition: Secondary | ICD-10-CM | POA: Diagnosis not present

## 2021-07-25 DIAGNOSIS — R350 Frequency of micturition: Secondary | ICD-10-CM | POA: Diagnosis not present

## 2021-07-25 DIAGNOSIS — G20 Parkinson's disease: Secondary | ICD-10-CM | POA: Diagnosis not present

## 2021-08-22 DIAGNOSIS — R2681 Unsteadiness on feet: Secondary | ICD-10-CM | POA: Diagnosis not present

## 2021-08-22 DIAGNOSIS — R350 Frequency of micturition: Secondary | ICD-10-CM | POA: Diagnosis not present

## 2021-08-22 DIAGNOSIS — G20 Parkinson's disease: Secondary | ICD-10-CM | POA: Diagnosis not present

## 2021-09-05 ENCOUNTER — Telehealth (INDEPENDENT_AMBULATORY_CARE_PROVIDER_SITE_OTHER): Payer: Medicare Other | Admitting: Family Medicine

## 2021-09-05 ENCOUNTER — Encounter: Payer: Self-pay | Admitting: Family Medicine

## 2021-09-05 ENCOUNTER — Ambulatory Visit (INDEPENDENT_AMBULATORY_CARE_PROVIDER_SITE_OTHER): Payer: Medicare Other

## 2021-09-05 VITALS — Temp 98.7°F | Wt 210.0 lb

## 2021-09-05 DIAGNOSIS — R059 Cough, unspecified: Secondary | ICD-10-CM

## 2021-09-05 DIAGNOSIS — Z0000 Encounter for general adult medical examination without abnormal findings: Secondary | ICD-10-CM | POA: Diagnosis not present

## 2021-09-05 DIAGNOSIS — Z780 Asymptomatic menopausal state: Secondary | ICD-10-CM | POA: Diagnosis not present

## 2021-09-05 DIAGNOSIS — R0981 Nasal congestion: Secondary | ICD-10-CM | POA: Diagnosis not present

## 2021-09-05 MED ORDER — DOXYCYCLINE HYCLATE 100 MG PO TABS: 100.0000 mg | ORAL_TABLET | Freq: Two times a day (BID) | ORAL | 0 refills | Status: DC

## 2021-09-05 MED ORDER — BENZONATATE 100 MG PO CAPS: ORAL_CAPSULE | ORAL | 0 refills | Status: DC

## 2021-09-05 NOTE — Patient Instructions (Signed)
Sara Nicholson , Thank you for taking time to come for your Medicare Wellness Visit. I appreciate your ongoing commitment to your health goals. Please review the following plan we discussed and let me know if I can assist you in the future.   Screening recommendations/referrals: Colonoscopy: 06/02/2018  due 2029 Mammogram: 10/31/2020 Bone Density: referral 09/04/2021 Recommended yearly ophthalmology/optometry visit for glaucoma screening and checkup Recommended yearly dental visit for hygiene and checkup  Vaccinations: Influenza vaccine: completed  Pneumococcal vaccine: completed  Tdap vaccine: 03/21/2018 Shingles vaccine: will consider     Advanced directives: will provide copies   Conditions/risks identified: none   Next appointment: none    Preventive Care 4 Years and Older, Female Preventive care refers to lifestyle choices and visits with your health care provider that can promote health and wellness. What does preventive care include? A yearly physical exam. This is also called an annual well check. Dental exams once or twice a year. Routine eye exams. Ask your health care provider how often you should have your eyes checked. Personal lifestyle choices, including: Daily care of your teeth and gums. Regular physical activity. Eating a healthy diet. Avoiding tobacco and drug use. Limiting alcohol use. Practicing safe sex. Taking low-dose aspirin every day. Taking vitamin and mineral supplements as recommended by your health care provider. What happens during an annual well check? The services and screenings done by your health care provider during your annual well check will depend on your age, overall health, lifestyle risk factors, and family history of disease. Counseling  Your health care provider may ask you questions about your: Alcohol use. Tobacco use. Drug use. Emotional well-being. Home and relationship well-being. Sexual activity. Eating habits. History of  falls. Memory and ability to understand (cognition). Work and work Statistician. Reproductive health. Screening  You may have the following tests or measurements: Height, weight, and BMI. Blood pressure. Lipid and cholesterol levels. These may be checked every 5 years, or more frequently if you are over 44 years old. Skin check. Lung cancer screening. You may have this screening every year starting at age 68 if you have a 30-pack-year history of smoking and currently smoke or have quit within the past 15 years. Fecal occult blood test (FOBT) of the stool. You may have this test every year starting at age 54. Flexible sigmoidoscopy or colonoscopy. You may have a sigmoidoscopy every 5 years or a colonoscopy every 10 years starting at age 67. Hepatitis C blood test. Hepatitis B blood test. Sexually transmitted disease (STD) testing. Diabetes screening. This is done by checking your blood sugar (glucose) after you have not eaten for a while (fasting). You may have this done every 1-3 years. Bone density scan. This is done to screen for osteoporosis. You may have this done starting at age 51. Mammogram. This may be done every 1-2 years. Talk to your health care provider about how often you should have regular mammograms. Talk with your health care provider about your test results, treatment options, and if necessary, the need for more tests. Vaccines  Your health care provider may recommend certain vaccines, such as: Influenza vaccine. This is recommended every year. Tetanus, diphtheria, and acellular pertussis (Tdap, Td) vaccine. You may need a Td booster every 10 years. Zoster vaccine. You may need this after age 27. Pneumococcal 13-valent conjugate (PCV13) vaccine. One dose is recommended after age 59. Pneumococcal polysaccharide (PPSV23) vaccine. One dose is recommended after age 43. Talk to your health care provider about which screenings and  vaccines you need and how often you need  them. This information is not intended to replace advice given to you by your health care provider. Make sure you discuss any questions you have with your health care provider. Document Released: 10/21/2015 Document Revised: 06/13/2016 Document Reviewed: 07/26/2015 Elsevier Interactive Patient Education  2017 [employer] Prevention in the Home Falls can cause injuries. They can happen to people of all ages. There are many things you can do to make your home safe and to help prevent falls. What can I do on the outside of my home? Regularly fix the edges of walkways and driveways and fix any cracks. Remove anything that might make you trip as you walk through a door, such as a raised step or threshold. Trim any bushes or trees on the path to your home. Use bright outdoor lighting. Clear any walking paths of anything that might make someone trip, such as rocks or tools. Regularly check to see if handrails are loose or broken. Make sure that both sides of any steps have handrails. Any raised decks and porches should have guardrails on the edges. Have any leaves, snow, or ice cleared regularly. Use sand or salt on walking paths during winter. Clean up any spills in your garage right away. This includes oil or grease spills. What can I do in the bathroom? Use night lights. Install grab bars by the toilet and in the tub and shower. Do not use towel bars as grab bars. Use non-skid mats or decals in the tub or shower. If you need to sit down in the shower, use a plastic, non-slip stool. Keep the floor dry. Clean up any water that spills on the floor as soon as it happens. Remove soap buildup in the tub or shower regularly. Attach bath mats securely with double-sided non-slip rug tape. Do not have throw rugs and other things on the floor that can make you trip. What can I do in the bedroom? Use night lights. Make sure that you have a light by your bed that is easy to reach. Do not use  any sheets or blankets that are too big for your bed. They should not hang down onto the floor. Have a firm chair that has side arms. You can use this for support while you get dressed. Do not have throw rugs and other things on the floor that can make you trip. What can I do in the kitchen? Clean up any spills right away. Avoid walking on wet floors. Keep items that you use a lot in easy-to-reach places. If you need to reach something above you, use a strong step stool that has a grab bar. Keep electrical cords out of the way. Do not use floor polish or wax that makes floors slippery. If you must use wax, use non-skid floor wax. Do not have throw rugs and other things on the floor that can make you trip. What can I do with my stairs? Do not leave any items on the stairs. Make sure that there are handrails on both sides of the stairs and use them. Fix handrails that are broken or loose. Make sure that handrails are as long as the stairways. Check any carpeting to make sure that it is firmly attached to the stairs. Fix any carpet that is loose or worn. Avoid having throw rugs at the top or bottom of the stairs. If you do have throw rugs, attach them to the floor with carpet tape. Make  sure that you have a light switch at the top of the stairs and the bottom of the stairs. If you do not have them, ask someone to add them for you. What else can I do to help prevent falls? Wear shoes that: Do not have high heels. Have rubber bottoms. Are comfortable and fit you well. Are closed at the toe. Do not wear sandals. If you use a stepladder: Make sure that it is fully opened. Do not climb a closed stepladder. Make sure that both sides of the stepladder are locked into place. Ask someone to hold it for you, if possible. Clearly mark and make sure that you can see: Any grab bars or handrails. First and last steps. Where the edge of each step is. Use tools that help you move around (mobility aids)  if they are needed. These include: Canes. Walkers. Scooters. Crutches. Turn on the lights when you go into a dark area. Replace any light bulbs as soon as they burn out. Set up your furniture so you have a clear path. Avoid moving your furniture around. If any of your floors are uneven, fix them. If there are any pets around you, be aware of where they are. Review your medicines with your doctor. Some medicines can make you feel dizzy. This can increase your chance of falling. Ask your doctor what other things that you can do to help prevent falls. This information is not intended to replace advice given to you by your health care provider. Make sure you discuss any questions you have with your health care provider. Document Released: 07/21/2009 Document Revised: 03/01/2016 Document Reviewed: 10/29/2014 Elsevier Interactive Patient Education  2017 [employer].

## 2021-09-05 NOTE — Progress Notes (Signed)
Virtual Visit via Video Note  I connected with Sara Nicholson  on 09/05/21 at  3:20 PM EST by a video enabled telemedicine application and verified that I am speaking with the correct person using two identifiers.  Location patient: home, [place] Location provider:work or home office Persons participating in the virtual visit: patient, provider  I discussed the limitations of evaluation and management by telemedicine and the availability of in person appointments. The patient expressed understanding and agreed to proceed.   HPI:  Acute telemedicine visit for a cough and congestion: -Onset: a few weeks ago but worsening the last 2-3 days ago -Symptoms include: nasal congestion, cough, ear hurts, sinus headache, low energy, feels like has rattle in the chest -Denies: fever, body aches, vomiting, CP, SOB, inability to tol oral intake or get out of bed -over thanksgiving other family members were sick with upper resp symptoms and now her whole family is sick -Pertinent past medical history: see below -Pertinent medication allergies:  Allergies  Allergen Reactions   Terbinafine Hcl   -COVID-19 vaccine status: Immunization History  Administered Date(s) Administered   DTaP 03/02/2009   Influenza-Unspecified 07/16/2014, 07/12/2015, 07/18/2016, 07/02/2021   Moderna Sars-Covid-2 Vaccination 12/31/2019, 01/28/2020, 09/17/2020, 04/30/2021   Td 10/08/1996, 03/02/2009   Tdap 03/21/2018    ROS: See pertinent positives and negatives per HPI.  Past Medical History:  Diagnosis Date   Depression     Past Surgical History:  Procedure Laterality Date   APPENDECTOMY     BREAST BIOPSY Left    BREAST EXCISIONAL BIOPSY Left    BREAST SURGERY     CESAREAN SECTION     COLONOSCOPY       Current Outpatient Medications:    escitalopram (LEXAPRO) 20 MG tablet, Take 1 tablet (20 mg total) by mouth daily., Disp: 90 tablet, Rfl: 1   pramipexole (MIRAPEX) 0.5 MG tablet, TAKE 1 TABLET AT 8 AM, 1PM,  6 PM, Disp:  270 tablet, Rfl: 1  Current Facility-Administered Medications:    0.9 %  sodium chloride infusion, 500 mL, Intravenous, Once, Danis, Kirke Corin, MD  EXAM:  VITALS per patient if applicable:  GENERAL: alert, oriented, appears well and in no acute distress  HEENT: atraumatic, conjunttiva clear, no obvious abnormalities on inspection of external nose and ears  NECK: normal movements of the head and neck  LUNGS: on inspection no signs of respiratory distress, breathing rate appears normal, no obvious gross SOB, gasping or wheezing  CV: no obvious cyanosis  MS: moves all visible extremities without noticeable abnormality  PSYCH/NEURO: pleasant and cooperative, no obvious depression or anxiety, speech and thought processing grossly intact  ASSESSMENT AND PLAN:  Discussed the following assessment and plan:  Cough, unspecified type  Nasal congestion  -we discussed possible serious and likely etiologies, options for evaluation and workup, limitations of telemedicine visit vs in person visit, treatment, treatment risks and precautions. Pt is agreeable to treatment via telemedicine at this moment. Query VURI, [id number], developing bacterial resp infection,  influenza vs other. She has opted for repeat covid testing tomorrow, empiric abx, tessalon for cough and other care measures per pt instructions. Discussed abx at length. She prefers to start as has felt unwell for a few weeks in case bacterial. Agrees to stop if covid test positive and do vv or call pharmacy for antiviral. Advise to seek prompt VV or in person care if worsening, new symptoms arise, or if is not improving with treatment.  I discussed the assessment and treatment plan with the  patient. The patient was provided an opportunity to ask questions and all were answered. The patient agreed with the plan and demonstrated an understanding of the instructions.     Lucretia Kern, DO

## 2021-09-05 NOTE — Patient Instructions (Signed)
  HOME CARE TIPS:  -COVID19 testing information: [email or web address].[email or web address]  Most pharmacies also offer testing and home test kits. If the Covid19 test is positive and you desire antiviral treatment, please contact a [facility] or schedule a follow up virtual visit through your primary care office or through the [facility].  Other test to treat options: ConnectRV.is?[email or web address]  -I sent the medication(s) we discussed to your pharmacy: Meds ordered this encounter  Medications   doxycycline (VIBRA-TABS) 100 MG tablet    Sig: Take 1 tablet (100 mg total) by mouth 2 (two) times daily.    Dispense:  20 tablet    Refill:  0   benzonatate (TESSALON PERLES) 100 MG capsule    Sig: 1-2 capsules up to twice daily    Dispense:  30 capsule    Refill:  0     -can use tylenol if needed for fevers, aches and pains per instructions  -can use nasal saline a few times per day if you have nasal congestion  -stay hydrated, drink plenty of fluids and eat small healthy meals - avoid dairy  -can take 1000 IU (56mcg) Vit D3 and 100-500 mg of Vit C daily per instructions  -If the Covid test is positive, check out the CDC website for more information on home care, transmission and treatment for COVID19  -follow up with your doctor in 2-3 days unless improving and feeling better   It was nice to meet you today, and I really hope you are feeling better soon. I help [facility] out with telemedicine visits on Tuesdays and Thursdays and am available for visits on those days. If you have any concerns or questions following this visit please schedule a follow up visit with your Primary Care doctor or seek care at a local urgent care clinic to avoid delays in care.    Seek in person care or schedule a follow up video visit promptly if your symptoms worsen, new concerns arise or you are not improving with treatment. Call 911 and/or seek emergency care  if your symptoms are severe or life threatening.

## 2021-09-05 NOTE — Progress Notes (Signed)
Subjective:   Sara Nicholson is a 65 y.o. female who presents for an Initial Medicare Annual Wellness Visit.  I connected with Sara Nicholson today by telephone and verified that I am speaking with the correct person using two identifiers. Location patient: home Location provider: work Persons participating in the virtual visit: patient, provider.   I discussed the limitations, risks, security and privacy concerns of performing an evaluation and management service by telephone and the availability of in person appointments. I also discussed with the patient that there may be a patient responsible charge related to this service. The patient expressed understanding and verbally consented to this telephonic visit.    Interactive audio and video telecommunications were attempted between this provider and patient, however failed, due to patient having technical difficulties OR patient did not have access to video capability.  We continued and completed visit with audio only.    Review of Systems     Cardiac Risk Factors include: advanced age (>74men, >72 women)     Objective:    Today's Vitals   There is no height or weight on file to calculate BMI.  Advanced Directives 09/05/2021 05/05/2021 06/21/2020 05/18/2020 04/05/2020  Does Patient Have a Medical Advance Directive? Yes Yes Yes Yes Yes  Type of Paramedic of [place];Living will [place];Living will;Out of facility DNR (pink MOST or yellow form) [place];Living will;Out of facility DNR (pink MOST or yellow form) [place];Living will [place];Living will  Copy of [place] in Chart? No - copy requested - - - -    Current Medications (verified) Outpatient Encounter Medications as of 09/05/2021  Medication Sig   escitalopram (LEXAPRO) 20 MG tablet Take 1 tablet (20 mg total) by mouth daily.   pramipexole (MIRAPEX)  0.5 MG tablet TAKE 1 TABLET AT 8 AM, 1PM,  6 PM   Facility-Administered Encounter Medications as of 09/05/2021  Medication   0.9 %  sodium chloride infusion    Allergies (verified) Terbinafine hcl   History: Past Medical History:  Diagnosis Date   Depression    Past Surgical History:  Procedure Laterality Date   APPENDECTOMY     BREAST BIOPSY Left    BREAST EXCISIONAL BIOPSY Left    BREAST SURGERY     CESAREAN SECTION     COLONOSCOPY     Family History  Problem Relation Age of Onset   Heart attack Father    Heart attack Brother    Cancer Brother        Mass on L5   Diabetes Maternal Grandmother    Heart attack Maternal Grandfather    Heart attack Paternal Grandfather    Cancer Brother    Lung cancer Brother    Cancer - Lung Brother        mets to esophagus and brain   Neurologic Disorder Sister 2       mild cognitive disorder   Neuromuscular disorder Mother 60       unknown diagnosis   Colon cancer Neg Hx    Colon polyps Neg Hx    Stomach cancer Neg Hx    Rectal cancer Neg Hx    Social History   Socioeconomic History   Marital status: Married    Spouse name: Not on file   Number of children: Not on file   Years of education: Not on file   Highest education level: Not on file  Occupational  History   Not on file  Tobacco Use   Smoking status: Never   Smokeless tobacco: Never  Vaping Use   Vaping Use: Never used  Substance and Sexual Activity   Alcohol use: No   Drug use: No   Sexual activity: Yes    Partners: Male    Birth control/protection: Post-menopausal  Other Topics Concern   Not on file  Social History Narrative   Right Handed   Lives in a one story with basement   Drinks Caffeine    Social Determinants of Health   Financial Resource Strain: Low Risk    Difficulty of Paying Living Expenses: Not hard at all  Food Insecurity: No Food Insecurity   Worried About Charity fundraiser in the Last Year: Never true   [place] in the  Last Year: Never true  Transportation Needs: No Transportation Needs   Lack of Transportation (Medical): No   Lack of Transportation (Non-Medical): No  Physical Activity: Sufficiently Active   Days of Exercise per Week: 7 days   Minutes of Exercise per Session: 60 min  Stress: No Stress Concern Present   Feeling of Stress : Only a little  Social Connections: Engineer, building services of Communication with Friends and Family: Twice a week   Frequency of Social Gatherings with Friends and Family: Twice a week   Attends Religious Services: More than 4 times per year   Active Member of [employer] or Organizations: Yes   Attends Music therapist: More than 4 times per year   Marital Status: Married    Tobacco Counseling Counseling given: Not Answered   Clinical Intake:  Pre-visit preparation completed: Yes  Pain : No/denies pain     Nutritional Risks: None Diabetes: No  How often do you need to have someone help you when you read instructions, pamphlets, or other written materials from your doctor or pharmacy?: 1 - Never What is the last grade level you completed in school?: college  Diabetic?no   Interpreter Needed?: No  Information entered by :: [place] of Daily Living In your present state of health, do you have any difficulty performing the following activities: 09/05/2021  Hearing? N  Vision? N  Difficulty concentrating or making decisions? N  Walking or climbing stairs? N  Dressing or bathing? N  Doing errands, shopping? N  Preparing Food and eating ? N  Using the Toilet? N  In the past six months, have you accidently leaked urine? N  Do you have problems with loss of bowel control? N  Managing your Medications? N  Managing your Finances? N  Housekeeping or managing your Housekeeping? N  Some recent data might be hidden    Patient Care Team: Nche, Charlene Brooke, NP as PCP - General (Internal Medicine) Salvadore Dom,  MD as Consulting Physician (Obstetrics and Gynecology) Tat, Eustace Quail, DO as Consulting Physician (Neurology)  Indicate any recent Medical Services you may have received from other than [facility] providers in the past year (date may be approximate).     Assessment:   This is a routine wellness examination for Sara Nicholson.  Hearing/Vision screen Vision Screening - Comments:: Annual eye exams wears glasses and contacts   Dietary issues and exercise activities discussed: Current Exercise Habits: Home exercise routine, Type of exercise: walking, Time (Minutes): 60, Frequency (Times/Week): 7, Weekly Exercise (Minutes/Week): 420, Intensity: Mild, Exercise limited by: neurologic condition(s)   Goals Addressed   None  Depression Screen PHQ 2/9 Scores 09/05/2021 03/16/2020 03/16/2020  PHQ - 2 Score 0 0 0  PHQ- 9 Score - 0 -    Fall Risk Fall Risk  09/05/2021 05/05/2021 06/21/2020 05/18/2020 03/16/2020  Falls in the past year? 0 0 0 0 0  Number falls in past yr: 0 0 0 0 0  Injury with Fall? 0 0 0 0 0  Follow up Falls evaluation completed - - - -    FALL RISK PREVENTION PERTAINING TO THE HOME:  Any stairs in or around the home? Yes  If so, are there any without handrails? No  Home free of loose throw rugs in walkways, pet beds, electrical cords, etc? Yes  Adequate lighting in your home to reduce risk of falls? Yes   ASSISTIVE DEVICES UTILIZED TO PREVENT FALLS:  Life alert? No  Use of a cane, walker or w/c? No  Grab bars in the bathroom? No  Shower chair or bench in shower? No  Elevated toilet seat or a handicapped toilet? No    Cognitive Function:    Normal cognitive status assessed by direct observation by this Nurse Health Advisor. No abnormalities found.      Immunizations Immunization History  Administered Date(s) Administered   DTaP 03/02/2009   Influenza-Unspecified 07/16/2014, 07/12/2015, 07/18/2016   Moderna Sars-Covid-2 Vaccination 12/31/2019, 01/28/2020, 09/17/2020   Td  10/08/1996, 03/02/2009   Tdap 03/21/2018    TDAP status: Up to date  Flu Vaccine status: Up to date  Pneumococcal vaccine status: Due, Education has been provided regarding the importance of this vaccine. Advised may receive this vaccine at local pharmacy or Health Dept. Aware to provide a copy of the vaccination record if obtained from local pharmacy or Health Dept. Verbalized acceptance and understanding.  Covid-19 vaccine status: Completed vaccines  Qualifies for Shingles Vaccine? Yes   Zostavax completed No   Shingrix Completed?: No.    Education has been provided regarding the importance of this vaccine. Patient has been advised to call insurance company to determine out of pocket expense if they have not yet received this vaccine. Advised may also receive vaccine at local pharmacy or Health Dept. Verbalized acceptance and understanding.  Screening Tests Health Maintenance  Topic Date Due   HIV Screening  Never done   Hepatitis C Screening  Never done   Zoster Vaccines- Shingrix (1 of 2) Never done   COVID-19 Vaccine (4 - Booster for Moderna series) 11/12/2020   Pneumonia Vaccine 27+ Years old (1 - PCV) Never done   DEXA SCAN  Never done   INFLUENZA VACCINE  05/08/2021   MAMMOGRAM  10/31/2021   PAP SMEAR-Modifier  04/05/2023   TETANUS/TDAP  03/21/2028   COLONOSCOPY (Pts 45-96yrs Insurance coverage will need to be confirmed)  06/02/2028   HPV VACCINES  Aged Out    Health Maintenance  Health Maintenance Due  Topic Date Due   HIV Screening  Never done   Hepatitis C Screening  Never done   Zoster Vaccines- Shingrix (1 of 2) Never done   COVID-19 Vaccine (4 - Booster for Moderna series) 11/12/2020   Pneumonia Vaccine 66+ Years old (1 - PCV) Never done   DEXA SCAN  Never done   INFLUENZA VACCINE  05/08/2021    Colorectal cancer screening: Type of screening: Colonoscopy. Completed 06/02/2018. Repeat every 10 years  Mammogram status: Completed 10/31/2020. Repeat every  year  Bone Density status: Ordered 09/05/2021. Pt provided with contact info and advised to call to schedule appt.  Lung  Cancer Screening: (Low Dose CT Chest recommended if Age 69-80 years, 30 pack-year currently smoking OR have quit w/in 15years.) does not qualify.   Lung Cancer Screening Referral: n/a  Additional Screening:  Hepatitis C Screening: does qualify;  Vision Screening: Recommended annual ophthalmology exams for early detection of glaucoma and other disorders of the eye. Is the patient up to date with their annual eye exam?  Yes  Who is the provider or what is the name of the office in which the patient attends annual eye exams? Dr.Miller  If pt is not established with a provider, would they like to be referred to a provider to establish care? No .   Dental Screening: Recommended annual dental exams for proper oral hygiene  Community Resource Referral / Chronic Care Management: CRR required this visit?  No   CCM required this visit?  No      Plan:     I have personally reviewed and noted the following in the patient's chart:   Medical and social history Use of alcohol, tobacco or illicit drugs  Current medications and supplements including opioid prescriptions. Patient is not currently taking opioid prescriptions. Functional ability and status Nutritional status Physical activity Advanced directives List of other physicians Hospitalizations, surgeries, and ER visits in previous 12 months Vitals Screenings to include cognitive, depression, and falls Referrals and appointments  In addition, I have reviewed and discussed with patient certain preventive protocols, quality metrics, and best practice recommendations. A written personalized care plan for preventive services as well as general preventive health recommendations were provided to patient.     Randel Pigg, LPN   [id number]   Nurse Notes: none

## 2021-09-12 ENCOUNTER — Telehealth: Payer: Self-pay | Admitting: Nurse Practitioner

## 2021-09-12 NOTE — Telephone Encounter (Signed)
Patient called because even with the antibiotics and cough medicine, patient is still coughing. She wants to know if there is anything else Dr.Kim suggest taking to help and if there is an explanation for her coughing that has lasted so long. I did let patient know that Dr.Kim was an acute virtual visit provider and she does not work in office.     Good callback number is [phone]   Please advise

## 2021-09-13 NOTE — Telephone Encounter (Signed)
Pt states today she is feeling better and has not been coughing as much.

## 2021-09-18 ENCOUNTER — Ambulatory Visit
Admission: RE | Admit: 2021-09-18 | Discharge: 2021-09-18 | Disposition: A | Discharge status: Home / Self Care | Payer: Medicare Other | Source: Ambulatory Visit | Attending: Nurse Practitioner | Admitting: Nurse Practitioner

## 2021-09-18 DIAGNOSIS — Z780 Asymptomatic menopausal state: Secondary | ICD-10-CM

## 2021-10-16 DIAGNOSIS — D0359 Melanoma in situ of other part of trunk: Secondary | ICD-10-CM | POA: Diagnosis not present

## 2021-10-16 DIAGNOSIS — D225 Melanocytic nevi of trunk: Secondary | ICD-10-CM | POA: Diagnosis not present

## 2021-10-17 DIAGNOSIS — D485 Neoplasm of uncertain behavior of skin: Secondary | ICD-10-CM | POA: Diagnosis not present

## 2021-10-17 DIAGNOSIS — L905 Scar conditions and fibrosis of skin: Secondary | ICD-10-CM | POA: Diagnosis not present

## 2021-10-17 DIAGNOSIS — R350 Frequency of micturition: Secondary | ICD-10-CM | POA: Diagnosis not present

## 2021-10-17 DIAGNOSIS — R2681 Unsteadiness on feet: Secondary | ICD-10-CM | POA: Diagnosis not present

## 2021-10-17 DIAGNOSIS — G20 Parkinson's disease: Secondary | ICD-10-CM | POA: Diagnosis not present

## 2021-10-27 NOTE — Progress Notes (Signed)
Assessment/Plan:   1.  Parkinsons Disease  -Continue pramipexole 0.5 mg 3 times per day.  She had thought about changing to the ER but discussed the cost of this and decided not to (wouldn't be a tier 1 drug).  R/B/SE were discussed.  The opportunity to ask questions was given and they were answered to the best of my ability.  The patient expressed understanding and willingness to follow the outlined treatment protocols.  -Discussed data on levodopa and starting it early (discussed in detail today and last few visits).  She doesn't want this addition right now.  -following with Amy Martinique for dermatology.  She had some pre-melanomas removed.  2.  Depression/GAD  -continue lexapro, 20 mg.     Subjective:   Sara Nicholson was seen today in follow up for Parkinsons disease.  My previous records were reviewed prior to todays visit as well as outside records available to me.  She continues to do well.  She has not had falls.  She is exercising.  She went to check on rsb in [place].  She found that she couldn't do it with the group of participants that were there (more advanced and affected her mood).  Instead she bought a Armed forces operational officer to a group on [place].  Her mood has been good on Lexapro.  She just had some pre-melanomas removed.  Current prescribed movement disorder medications: Pramipexole 0.5 mg 3 times per day  Lexapro, 20 mg daily   ALLERGIES:   Allergies  Allergen Reactions   Terbinafine Hcl     CURRENT MEDICATIONS:  Outpatient Encounter Medications as of 10/31/2021  Medication Sig   benzonatate (TESSALON PERLES) 100 MG capsule 1-2 capsules up to twice daily   doxycycline (VIBRA-TABS) 100 MG tablet Take 1 tablet (100 mg total) by mouth 2 (two) times daily.   escitalopram (LEXAPRO) 20 MG tablet Take 1 tablet (20 mg total) by mouth daily.   pramipexole (MIRAPEX) 0.5 MG tablet TAKE 1 TABLET AT 8 AM, 1PM,  6 PM   Facility-Administered Encounter Medications as of  10/31/2021  Medication   0.9 %  sodium chloride infusion    Objective:   PHYSICAL EXAMINATION:    VITALS:   Vitals:   10/31/21 0840  BP: (!) 145/71  Pulse: 72  SpO2: 97%  Weight: 214 lb 6.4 oz (97.3 kg)  Height: 5\' 4"  (1.626 m)   Wt Readings from Last 3 Encounters:  10/31/21 214 lb 6.4 oz (97.3 kg)  09/05/21 210 lb (95.3 kg)  05/05/21 214 lb 3.2 oz (97.2 kg)      Wt Readings from Last 3 Encounters:  10/31/21 214 lb 6.4 oz (97.3 kg)  09/05/21 210 lb (95.3 kg)  05/05/21 214 lb 3.2 oz (97.2 kg)     GEN:  The patient appears stated age and is in NAD. HEENT:  Normocephalic, atraumatic.  The mucous membranes are moist. The superficial temporal arteries are without ropiness or tenderness. CV:  RRR Lungs:  CTAB Neck/HEME:  There are no carotid bruits bilaterally.  Neurological examination:  Orientation: The patient is alert and oriented x3. Cranial nerves: There is good facial symmetry without facial hypomimia. The speech is fluent and clear. Soft palate rises symmetrically and there is no tongue deviation. Hearing is intact to conversational tone. Sensation: Sensation is intact to light touch throughout Motor: Strength is at least antigravity x4.  Movement examination: Tone: There is nl tone in the UE/LE Abnormal movements: there is RUE/RLE rest tremor, mild  to mod and more rare in the LLE Coordination:  There is no decremation with RAM's, with any form of RAMS, including alternating supination and pronation of the forearm, hand opening and closing, finger taps, heel taps and toe taps. Gait and Station: The patient has no difficulty arising out of a deep-seated chair without the use of the hands. The patient's stride length is good.    I have reviewed and interpreted the following labs independently    Chemistry      Component Value Date/Time   NA 139 03/16/2020 1349   NA 142 04/07/2019 0822   K 3.9 03/16/2020 1349   CL 104 03/16/2020 1349   CO2 28 03/16/2020 1349    BUN 16 03/16/2020 1349   BUN 12 04/07/2019 0822   CREATININE 0.72 03/16/2020 1349      Component Value Date/Time   CALCIUM 9.7 03/16/2020 1349   ALKPHOS 100 03/16/2020 1349   AST 22 03/16/2020 1349   ALT 24 03/16/2020 1349   BILITOT 0.4 03/16/2020 1349   BILITOT 0.4 04/07/2019 0822       Lab Results  Component Value Date   WBC 6.8 03/16/2020   HGB 13.6 03/16/2020   HCT 40.3 03/16/2020   MCV 92.1 03/16/2020   PLT 243.0 03/16/2020    Lab Results  Component Value Date   TSH 1.33 03/16/2020     Total time spent on today's visit was 31 minutes, including both face-to-face time and nonface-to-face time.  Time included that spent on review of records (prior notes available to me/labs/imaging if pertinent), discussing treatment and goals, answering patient's questions and coordinating care.  Cc:  Nche, Charlene Brooke, NP

## 2021-10-31 ENCOUNTER — Other Ambulatory Visit: Payer: Self-pay

## 2021-10-31 ENCOUNTER — Encounter: Payer: Self-pay | Admitting: Neurology

## 2021-10-31 ENCOUNTER — Ambulatory Visit: Payer: Medicare Other | Admitting: Neurology

## 2021-10-31 VITALS — BP 145/71 | HR 72 | Ht 64.0 in | Wt 214.4 lb

## 2021-10-31 DIAGNOSIS — G20 Parkinson's disease: Secondary | ICD-10-CM

## 2021-11-09 ENCOUNTER — Ambulatory Visit: Payer: Medicare Other | Admitting: Neurology

## 2021-11-13 ENCOUNTER — Other Ambulatory Visit: Payer: Self-pay | Admitting: Neurology

## 2021-11-16 DIAGNOSIS — R350 Frequency of micturition: Secondary | ICD-10-CM | POA: Diagnosis not present

## 2021-11-16 DIAGNOSIS — R2681 Unsteadiness on feet: Secondary | ICD-10-CM | POA: Diagnosis not present

## 2021-12-08 ENCOUNTER — Other Ambulatory Visit: Payer: Self-pay | Admitting: Obstetrics and Gynecology

## 2021-12-08 DIAGNOSIS — Z1231 Encounter for screening mammogram for malignant neoplasm of breast: Secondary | ICD-10-CM

## 2021-12-14 ENCOUNTER — Ambulatory Visit
Admission: RE | Admit: 2021-12-14 | Discharge: 2021-12-14 | Disposition: A | Discharge status: Home / Self Care | Payer: Medicare Other | Source: Ambulatory Visit | Attending: Obstetrics and Gynecology | Admitting: Obstetrics and Gynecology

## 2021-12-14 DIAGNOSIS — Z1231 Encounter for screening mammogram for malignant neoplasm of breast: Secondary | ICD-10-CM | POA: Diagnosis not present

## 2021-12-14 DIAGNOSIS — R2681 Unsteadiness on feet: Secondary | ICD-10-CM | POA: Diagnosis not present

## 2021-12-14 DIAGNOSIS — R350 Frequency of micturition: Secondary | ICD-10-CM | POA: Diagnosis not present

## 2021-12-15 ENCOUNTER — Telehealth: Payer: Self-pay | Admitting: Neurology

## 2021-12-15 NOTE — Telephone Encounter (Signed)
Patient is sch  

## 2021-12-15 NOTE — Progress Notes (Unsigned)
Virtual Visit Via Video   The purpose of this virtual visit is to provide medical care while limiting exposure to the novel coronavirus.    Consent was obtained for video visit:  {yes no:314532} Answered questions that patient had about telehealth interaction:  {yes no:314532} I discussed the limitations, risks, security and privacy concerns of performing an evaluation and management service by telemedicine. I also discussed with the patient that there may be a patient responsible charge related to this service. The patient expressed understanding and agreed to proceed.  Pt location: Home Physician Location: office Name of referring provider:  Nche, Charlene Brooke, NP I connected with Sierrah Luevano Barley at patients initiation/request on 12/19/2021 at  8:15 AM EDT by video enabled telemedicine application and verified that I am speaking with the correct person using two identifiers. Pt MRN:  [id number] Pt DOB:  Feb 12, 1956 Video Participants:  Mariann Laster E Gurr;  ***   Assessment/Plan:   1.  Parkinsons Disease  -Continue pramipexole 0.5 mg 3 times per day.    -After some discussion, we decided to start carbidopa/levodopa 25/100 and work to 1 tablet 3 times per day.  We discussed risk, benefits, side effects.  -following with Amy Martinique for dermatology.  She had some pre-melanomas removed.  2.  Depression/GAD  -continue lexapro, 20 mg.     Subjective:   Sara Nicholson was seen today in follow up for Parkinsons disease.  My previous records were reviewed prior to todays visit as well as outside records available to me.  She was worked in today to discuss the addition of levodopa.  We discussed at last visit, but she ultimately decided to hold on it.  Current prescribed movement disorder medications: Pramipexole 0.5 mg 3 times per day  Lexapro, 20 mg daily   ALLERGIES:   Allergies  Allergen Reactions   Terbinafine Hcl     CURRENT MEDICATIONS:  Outpatient Encounter Medications as of  12/19/2021  Medication Sig   benzonatate (TESSALON PERLES) 100 MG capsule 1-2 capsules up to twice daily   doxycycline (VIBRA-TABS) 100 MG tablet Take 1 tablet (100 mg total) by mouth 2 (two) times daily.   escitalopram (LEXAPRO) 20 MG tablet TAKE ONE TABLET BY MOUTH ONE TIME DAILY   pramipexole (MIRAPEX) 0.5 MG tablet TAKE 1 TABLET AT 8 AM, 1PM,  6 PM   Facility-Administered Encounter Medications as of 12/19/2021  Medication   0.9 %  sodium chloride infusion    Objective:   PHYSICAL EXAMINATION:    VITALS:   There were no vitals filed for this visit.  Wt Readings from Last 3 Encounters:  10/31/21 214 lb 6.4 oz (97.3 kg)  09/05/21 210 lb (95.3 kg)  05/05/21 214 lb 3.2 oz (97.2 kg)      Wt Readings from Last 3 Encounters:  10/31/21 214 lb 6.4 oz (97.3 kg)  09/05/21 210 lb (95.3 kg)  05/05/21 214 lb 3.2 oz (97.2 kg)     GEN:  The patient appears stated age and is in NAD. HEENT:  Normocephalic, atraumatic.  The mucous membranes are moist. The superficial temporal arteries are without ropiness or tenderness. CV:  RRR Lungs:  CTAB Neck/HEME:  There are no carotid bruits bilaterally.  Neurological examination:  Orientation: The patient is alert and oriented x3. Cranial nerves: There is good facial symmetry without facial hypomimia. The speech is fluent and clear. Soft palate rises symmetrically and there is no tongue deviation. Hearing is intact to conversational tone. Sensation: Sensation is intact  to light touch throughout Motor: Strength is at least antigravity x4.  Movement examination: Tone: There is nl tone in the UE/LE Abnormal movements: there is RUE/RLE rest tremor, mild to mod and more rare in the LLE Coordination:  There is no decremation with RAM's, with any form of RAMS, including alternating supination and pronation of the forearm, hand opening and closing, finger taps, heel taps and toe taps. Gait and Station: The patient has no difficulty arising out of a  deep-seated chair without the use of the hands. The patient's stride length is good.    I have reviewed and interpreted the following labs independently    Chemistry      Component Value Date/Time   NA 139 03/16/2020 1349   NA 142 04/07/2019 0822   K 3.9 03/16/2020 1349   CL 104 03/16/2020 1349   CO2 28 03/16/2020 1349   BUN 16 03/16/2020 1349   BUN 12 04/07/2019 0822   CREATININE 0.72 03/16/2020 1349      Component Value Date/Time   CALCIUM 9.7 03/16/2020 1349   ALKPHOS 100 03/16/2020 1349   AST 22 03/16/2020 1349   ALT 24 03/16/2020 1349   BILITOT 0.4 03/16/2020 1349   BILITOT 0.4 04/07/2019 0822       Lab Results  Component Value Date   WBC 6.8 03/16/2020   HGB 13.6 03/16/2020   HCT 40.3 03/16/2020   MCV 92.1 03/16/2020   PLT 243.0 03/16/2020    Lab Results  Component Value Date   TSH 1.33 03/16/2020     Total time spent on today's visit was *** minutes, including both face-to-face time and nonface-to-face time.  Time included that spent on review of records (prior notes available to me/labs/imaging if pertinent), discussing treatment and goals, answering patient's questions and coordinating care.  Cc:  Nche, Charlene Brooke, NP

## 2021-12-15 NOTE — Telephone Encounter (Signed)
Patient called and said she ready to start the second medication (no dancing, per pt.). ? ?[facility] [place] ? ?Patient is requesting a call back from the medical assistant please.  ? ?She said she is feeling emotional today. ?

## 2021-12-15 NOTE — Patient Instructions (Signed)
It was good seeing you today!  As discussed you should take the following: ? ?Start Carbidopa Levodopa as follows: ?Take 1/2 tablet three times daily, at least 30 minutes before meals (approximately 7am/11am/4pm), for one week ?Then take 1/2 tablet in the morning, 1/2 tablet in the afternoon, 1 tablet in the evening, at least 30 minutes before meals, for one week ?Then take 1/2 tablet in the morning, 1 tablet in the afternoon, 1 tablet in the evening, at least 30 minutes before meals, for one week ?Then take 1 tablet three times daily at 7am/11am/4pm, at least 30 minutes before meals ?  As a reminder, carbidopa/levodopa can be taken at the same time as a carbohydrate, but we like to have you take your pill either 30 minutes before a protein source or 1 hour after as protein can interfere with carbidopa/levodopa absorption. ? ?

## 2021-12-15 NOTE — Telephone Encounter (Signed)
Called patient back and spoke to her about some issues she is having with her starting cl and admitting she has parkinson's. Patient going to the [facility] for the parkinson's event  ?

## 2021-12-18 DIAGNOSIS — R269 Unspecified abnormalities of gait and mobility: Secondary | ICD-10-CM | POA: Diagnosis not present

## 2021-12-19 ENCOUNTER — Telehealth: Payer: Medicare Other | Admitting: Neurology

## 2021-12-19 ENCOUNTER — Other Ambulatory Visit: Payer: Self-pay

## 2021-12-19 VITALS — Ht 65.0 in | Wt 210.0 lb

## 2021-12-19 DIAGNOSIS — G20 Parkinson's disease: Secondary | ICD-10-CM | POA: Diagnosis not present

## 2021-12-19 MED ORDER — CARBIDOPA-LEVODOPA 25-100 MG PO TABS: 1.0000 | ORAL_TABLET | Freq: Three times a day (TID) | ORAL | 1 refills | Status: DC

## 2021-12-29 DIAGNOSIS — R269 Unspecified abnormalities of gait and mobility: Secondary | ICD-10-CM | POA: Diagnosis not present

## 2022-01-04 DIAGNOSIS — R269 Unspecified abnormalities of gait and mobility: Secondary | ICD-10-CM | POA: Diagnosis not present

## 2022-01-22 DIAGNOSIS — L565 Disseminated superficial actinic porokeratosis (DSAP): Secondary | ICD-10-CM | POA: Diagnosis not present

## 2022-01-22 DIAGNOSIS — L821 Other seborrheic keratosis: Secondary | ICD-10-CM | POA: Diagnosis not present

## 2022-01-22 DIAGNOSIS — Z85820 Personal history of malignant melanoma of skin: Secondary | ICD-10-CM | POA: Diagnosis not present

## 2022-01-22 DIAGNOSIS — D225 Melanocytic nevi of trunk: Secondary | ICD-10-CM | POA: Diagnosis not present

## 2022-01-22 DIAGNOSIS — D2272 Melanocytic nevi of left lower limb, including hip: Secondary | ICD-10-CM | POA: Diagnosis not present

## 2022-01-22 DIAGNOSIS — D224 Melanocytic nevi of scalp and neck: Secondary | ICD-10-CM | POA: Diagnosis not present

## 2022-01-22 DIAGNOSIS — L738 Other specified follicular disorders: Secondary | ICD-10-CM | POA: Diagnosis not present

## 2022-01-22 DIAGNOSIS — D2239 Melanocytic nevi of other parts of face: Secondary | ICD-10-CM | POA: Diagnosis not present

## 2022-01-22 DIAGNOSIS — Z85828 Personal history of other malignant neoplasm of skin: Secondary | ICD-10-CM | POA: Diagnosis not present

## 2022-01-22 DIAGNOSIS — D2372 Other benign neoplasm of skin of left lower limb, including hip: Secondary | ICD-10-CM | POA: Diagnosis not present

## 2022-01-22 DIAGNOSIS — D2261 Melanocytic nevi of right upper limb, including shoulder: Secondary | ICD-10-CM | POA: Diagnosis not present

## 2022-01-22 DIAGNOSIS — D2271 Melanocytic nevi of right lower limb, including hip: Secondary | ICD-10-CM | POA: Diagnosis not present

## 2022-01-23 DIAGNOSIS — R2681 Unsteadiness on feet: Secondary | ICD-10-CM | POA: Diagnosis not present

## 2022-01-23 DIAGNOSIS — R350 Frequency of micturition: Secondary | ICD-10-CM | POA: Diagnosis not present

## 2022-01-29 DIAGNOSIS — R269 Unspecified abnormalities of gait and mobility: Secondary | ICD-10-CM | POA: Diagnosis not present

## 2022-02-18 DIAGNOSIS — R269 Unspecified abnormalities of gait and mobility: Secondary | ICD-10-CM | POA: Diagnosis not present

## 2022-02-26 DIAGNOSIS — R269 Unspecified abnormalities of gait and mobility: Secondary | ICD-10-CM | POA: Diagnosis not present

## 2022-03-06 DIAGNOSIS — R350 Frequency of micturition: Secondary | ICD-10-CM | POA: Diagnosis not present

## 2022-03-06 DIAGNOSIS — R2681 Unsteadiness on feet: Secondary | ICD-10-CM | POA: Diagnosis not present

## 2022-03-12 DIAGNOSIS — R269 Unspecified abnormalities of gait and mobility: Secondary | ICD-10-CM | POA: Diagnosis not present

## 2022-03-19 DIAGNOSIS — R269 Unspecified abnormalities of gait and mobility: Secondary | ICD-10-CM | POA: Diagnosis not present

## 2022-03-21 ENCOUNTER — Other Ambulatory Visit: Payer: Self-pay | Admitting: Neurology

## 2022-03-22 LAB — HEMOGLOBIN A1C: Hemoglobin A1C: 5.6

## 2022-03-22 LAB — HM DIABETES FOOT EXAM

## 2022-03-22 LAB — HM DEXA SCAN: HM Dexa Scan: NORMAL

## 2022-03-23 ENCOUNTER — Other Ambulatory Visit: Payer: Self-pay

## 2022-03-23 MED ORDER — ESCITALOPRAM OXALATE 20 MG PO TABS: 20.0000 mg | ORAL_TABLET | Freq: Every day | ORAL | 1 refills | Status: DC

## 2022-03-23 MED ORDER — CARBIDOPA-LEVODOPA 25-100 MG PO TABS: 1.0000 | ORAL_TABLET | Freq: Three times a day (TID) | ORAL | 0 refills | Status: DC

## 2022-03-27 DIAGNOSIS — R269 Unspecified abnormalities of gait and mobility: Secondary | ICD-10-CM | POA: Diagnosis not present

## 2022-03-29 NOTE — Progress Notes (Signed)
66 y.o. [id number] Married White or Caucasian Not Hispanic or Latino female here for annual exam.  No vaginal bleeding. Sexually active. No dyspareunia.   Has Parkinson's, still only has a tremor.    Has mixed incontinence. Markedly helped with PT.   No bowel c/o.   She has had some elevated BP's, is going to f/u with her primary later this month.  Her sister has early onset Alzheimer's. Has 24 hour care, progressing rapidly.   Patient's last menstrual period was 06/08/2010.          Sexually active: Yes.    The current method of family planning is post menopausal status.    Exercising: Yes.     Daily  Smoker:  no  Health Maintenance: Pap:  04/04/20 WNL Hr HPV Neg  03-21-17 ASCUS NEG HR HPV             12-16-12 WNL NEG HR HPV History of abnormal Pap:  yes, had laser surgery in the early 80's MMG:  12/14/21 density C Bi-rads 1 neg  BMD:   09/18/21 minimal osteopenia/ low bone mass  Colonoscopy: 06/02/18 normal, F/U 10 years  TDaP:  03/08/18  Gardasil: N/A   reports that she has never smoked. She has never used smokeless tobacco. She reports that she does not drink alcohol and does not use drugs. Retired. Daughter is living with her.   Past Medical History:  Diagnosis Date   Depression     Past Surgical History:  Procedure Laterality Date   APPENDECTOMY     BREAST BIOPSY Left    BREAST EXCISIONAL BIOPSY Left    BREAST SURGERY     CESAREAN SECTION     COLONOSCOPY      Current Outpatient Medications  Medication Sig Dispense Refill   carbidopa-levodopa (SINEMET IR) 25-100 MG tablet Take 1 tablet by mouth 3 (three) times daily. 7am/11am/4pm 270 tablet 0   escitalopram (LEXAPRO) 20 MG tablet TAKE ONE TABLET BY MOUTH ONE TIME DAILY 90 tablet 1   pramipexole (MIRAPEX) 0.5 MG tablet TAKE ONE TABLET BY MOUTH AT 8 IN THE MORNING, ONE TABLET AT 1:00PM , AND ONE TABLET AT 6PM. 270 tablet 0   Current Facility-Administered Medications  Medication Dose Route Frequency Provider Last Rate Last  Admin   0.9 %  sodium chloride infusion  500 mL Intravenous Once Doran Stabler, MD        Family History  Problem Relation Age of Onset   Heart attack Father    Heart attack Brother    Cancer Brother        Mass on L5   Diabetes Maternal Grandmother    Heart attack Maternal Grandfather    Heart attack Paternal Grandfather    Cancer Brother    Lung cancer Brother    Cancer - Lung Brother        mets to esophagus and brain   Neurologic Disorder Sister 60       mild cognitive disorder   Neuromuscular disorder Mother 70       unknown diagnosis   Colon cancer Neg Hx    Colon polyps Neg Hx    Stomach cancer Neg Hx    Rectal cancer Neg Hx     Review of Systems  All other systems reviewed and are negative.   Exam:   BP 134/68   Pulse 70   Ht '5\' 4"'$  (1.626 m)   Wt 217 lb 9.6 oz (98.7 kg)   LMP 06/08/2010  SpO2 100%   BMI 37.35 kg/m   Weight change: '@WEIGHTCHANGE'$ @ Height:   Height: '5\' 4"'$  (162.6 cm)  Ht Readings from Last 3 Encounters:  04/11/22 '5\' 4"'$  (1.626 m)  04/05/22 '5\' 5"'$  (1.651 m)  12/19/21 '5\' 5"'$  (1.651 m)    General appearance: alert, cooperative and appears stated age Head: Normocephalic, without obvious abnormality, atraumatic Neck: no adenopathy, supple, symmetrical, trachea midline and thyroid normal to inspection and palpation Lungs: clear to auscultation bilaterally Cardiovascular: regular rate and rhythm Breasts: normal appearance, no masses or tenderness Abdomen: soft, non-tender; non distended,  no masses,  no organomegaly Extremities: extremities normal, atraumatic, no cyanosis or edema Skin: Skin color, texture, turgor normal. No rashes or lesions Lymph nodes: Cervical, supraclavicular, and axillary nodes normal. No abnormal inguinal nodes palpated Neurologic: Grossly normal   Pelvic: External genitalia:  no lesions              Urethra:  normal appearing urethra with no masses, tenderness or lesions              Bartholins and Skenes: normal                  Vagina: normal appearing vagina with normal color and discharge, no lesions              Cervix: no lesions               Bimanual Exam:  Uterus:   no masses or tenderness              Adnexa: no mass, fullness, tenderness               Rectovaginal: Confirms               Anus:  normal sphincter tone, no lesions  Gae Dry chaperoned for the exam.  1. Encounter for breast and pelvic examination Discussed breast self exam Discussed calcium and vit D intake

## 2022-04-03 NOTE — Progress Notes (Deleted)
Assessment/Plan:   1.  Parkinsons Disease  -Continue pramipexole 0.5 mg 3 times per day.    -Continue carbidopa/levodopa 25/100, 1 tablet 3 times per day.  2.  Depression/GAD  -continue lexapro, 20 mg.     Subjective:   Sara Nicholson was seen today in follow up for Parkinsons disease.  My previous records were reviewed prior to todays visit as well as outside records available to me.  I saw her via video last visit.  We started her on levodopa.  She reports that ***  Current prescribed movement disorder medications: Pramipexole 0.5 mg 3 times per day  Carbidopa/levodopa 25/100, 1 tablet 3 times per day (started last visit) Lexapro, 20 mg daily   ALLERGIES:   Allergies  Allergen Reactions   Terbinafine Hcl     CURRENT MEDICATIONS:  Outpatient Encounter Medications as of 04/05/2022  Medication Sig   carbidopa-levodopa (SINEMET IR) 25-100 MG tablet Take 1 tablet by mouth 3 (three) times daily. 7am/11am/4pm   escitalopram (LEXAPRO) 20 MG tablet TAKE ONE TABLET BY MOUTH ONE TIME DAILY   escitalopram (LEXAPRO) 20 MG tablet Take 1 tablet (20 mg total) by mouth daily.   pramipexole (MIRAPEX) 0.5 MG tablet TAKE ONE TABLET BY MOUTH AT 8 IN THE MORNING, ONE TABLET AT 1:00PM , AND ONE TABLET AT 6PM.   Facility-Administered Encounter Medications as of 04/05/2022  Medication   0.9 %  sodium chloride infusion    Objective:   PHYSICAL EXAMINATION:    VITALS:   There were no vitals filed for this visit.  Wt Readings from Last 3 Encounters:  12/19/21 210 lb (95.3 kg)  10/31/21 214 lb 6.4 oz (97.3 kg)  09/05/21 210 lb (95.3 kg)      Wt Readings from Last 3 Encounters:  12/19/21 210 lb (95.3 kg)  10/31/21 214 lb 6.4 oz (97.3 kg)  09/05/21 210 lb (95.3 kg)     GEN:  The patient appears stated age and is in NAD. HEENT:  Normocephalic, atraumatic.  The mucous membranes are moist. The superficial temporal arteries are without ropiness or tenderness. CV:  RRR Lungs:   CTAB Neck/HEME:  There are no carotid bruits bilaterally.  Neurological examination:  Orientation: The patient is alert and oriented x3. Cranial nerves: There is good facial symmetry without facial hypomimia. The speech is fluent and clear. Soft palate rises symmetrically and there is no tongue deviation. Hearing is intact to conversational tone. Sensation: Sensation is intact to light touch throughout Motor: Strength is at least antigravity x4.  Movement examination: Tone: There is nl tone in the UE/LE Abnormal movements: there is RUE/RLE rest tremor, mild to mod and more rare in the LLE Coordination:  There is no decremation with RAM's, with any form of RAMS, including alternating supination and pronation of the forearm, hand opening and closing, finger taps, heel taps and toe taps. Gait and Station: The patient has no difficulty arising out of a deep-seated chair without the use of the hands. The patient's stride length is good.    I have reviewed and interpreted the following labs independently    Chemistry      Component Value Date/Time   NA 139 03/16/2020 1349   NA 142 04/07/2019 0822   K 3.9 03/16/2020 1349   CL 104 03/16/2020 1349   CO2 28 03/16/2020 1349   BUN 16 03/16/2020 1349   BUN 12 04/07/2019 0822   CREATININE 0.72 03/16/2020 1349      Component Value Date/Time  CALCIUM 9.7 03/16/2020 1349   ALKPHOS 100 03/16/2020 1349   AST 22 03/16/2020 1349   ALT 24 03/16/2020 1349   BILITOT 0.4 03/16/2020 1349   BILITOT 0.4 04/07/2019 0822       Lab Results  Component Value Date   WBC 6.8 03/16/2020   HGB 13.6 03/16/2020   HCT 40.3 03/16/2020   MCV 92.1 03/16/2020   PLT 243.0 03/16/2020    Lab Results  Component Value Date   TSH 1.33 03/16/2020     Total time spent on today's visit was *** minutes, including both face-to-face time and nonface-to-face time.  Time included that spent on review of records (prior notes available to me/labs/imaging if pertinent),  discussing treatment and goals, answering patient's questions and coordinating care.  Cc:  Nche, Charlene Brooke, NP

## 2022-04-04 NOTE — Progress Notes (Unsigned)
Assessment/Plan:   1.  Parkinsons Disease  -Continue pramipexole 0.5 mg 3 times per day.    -Continue carbidopa/levodopa 25/100, 1 tablet 3 times per day.  2.  Depression/GAD  -continue lexapro, 20 mg.     Subjective:   Sara Nicholson was seen today in follow up for Parkinsons disease.  My previous records were reviewed prior to todays visit as well as outside records available to me.  I saw her via video last visit.  We started her on levodopa.  She reports that ***  Current prescribed movement disorder medications: Pramipexole 0.5 mg 3 times per day  Carbidopa/levodopa 25/100, 1 tablet 3 times per day (started last visit) Lexapro, 20 mg daily   ALLERGIES:   Allergies  Allergen Reactions   Terbinafine Hcl     CURRENT MEDICATIONS:  Outpatient Encounter Medications as of 04/05/2022  Medication Sig   carbidopa-levodopa (SINEMET IR) 25-100 MG tablet Take 1 tablet by mouth 3 (three) times daily. 7am/11am/4pm   escitalopram (LEXAPRO) 20 MG tablet TAKE ONE TABLET BY MOUTH ONE TIME DAILY   escitalopram (LEXAPRO) 20 MG tablet Take 1 tablet (20 mg total) by mouth daily.   pramipexole (MIRAPEX) 0.5 MG tablet TAKE ONE TABLET BY MOUTH AT 8 IN THE MORNING, ONE TABLET AT 1:00PM , AND ONE TABLET AT 6PM.   Facility-Administered Encounter Medications as of 04/05/2022  Medication   0.9 %  sodium chloride infusion    Objective:   PHYSICAL EXAMINATION:    VITALS:   There were no vitals filed for this visit.  Wt Readings from Last 3 Encounters:  12/19/21 210 lb (95.3 kg)  10/31/21 214 lb 6.4 oz (97.3 kg)  09/05/21 210 lb (95.3 kg)      Wt Readings from Last 3 Encounters:  12/19/21 210 lb (95.3 kg)  10/31/21 214 lb 6.4 oz (97.3 kg)  09/05/21 210 lb (95.3 kg)     GEN:  The patient appears stated age and is in NAD. HEENT:  Normocephalic, atraumatic.  The mucous membranes are moist. The superficial temporal arteries are without ropiness or tenderness. CV:  RRR Lungs:   CTAB Neck/HEME:  There are no carotid bruits bilaterally.  Neurological examination:  Orientation: The patient is alert and oriented x3. Cranial nerves: There is good facial symmetry without facial hypomimia. The speech is fluent and clear. Soft palate rises symmetrically and there is no tongue deviation. Hearing is intact to conversational tone. Sensation: Sensation is intact to light touch throughout Motor: Strength is at least antigravity x4.  Movement examination: Tone: There is nl tone in the UE/LE Abnormal movements: there is RUE/RLE rest tremor, mild to mod and more rare in the LLE Coordination:  There is no decremation with RAM's, with any form of RAMS, including alternating supination and pronation of the forearm, hand opening and closing, finger taps, heel taps and toe taps. Gait and Station: The patient has no difficulty arising out of a deep-seated chair without the use of the hands. The patient's stride length is good.    I have reviewed and interpreted the following labs independently    Chemistry      Component Value Date/Time   NA 139 03/16/2020 1349   NA 142 04/07/2019 0822   K 3.9 03/16/2020 1349   CL 104 03/16/2020 1349   CO2 28 03/16/2020 1349   BUN 16 03/16/2020 1349   BUN 12 04/07/2019 0822   CREATININE 0.72 03/16/2020 1349      Component Value Date/Time  CALCIUM 9.7 03/16/2020 1349   ALKPHOS 100 03/16/2020 1349   AST 22 03/16/2020 1349   ALT 24 03/16/2020 1349   BILITOT 0.4 03/16/2020 1349   BILITOT 0.4 04/07/2019 0822       Lab Results  Component Value Date   WBC 6.8 03/16/2020   HGB 13.6 03/16/2020   HCT 40.3 03/16/2020   MCV 92.1 03/16/2020   PLT 243.0 03/16/2020    Lab Results  Component Value Date   TSH 1.33 03/16/2020     Total time spent on today's visit was *** minutes, including both face-to-face time and nonface-to-face time.  Time included that spent on review of records (prior notes available to me/labs/imaging if pertinent),  discussing treatment and goals, answering patient's questions and coordinating care.  Cc:  Nche, Charlene Brooke, NP

## 2022-04-05 ENCOUNTER — Ambulatory Visit: Payer: Medicare Other | Admitting: Neurology

## 2022-04-05 ENCOUNTER — Encounter: Payer: Self-pay | Admitting: Neurology

## 2022-04-05 VITALS — BP 150/82 | HR 77 | Ht 65.0 in | Wt 219.0 lb

## 2022-04-05 DIAGNOSIS — G20 Parkinson's disease: Secondary | ICD-10-CM

## 2022-04-05 NOTE — Patient Instructions (Signed)
Trial melatonin, 3 mg at bedtime  Local and Online Resources for Power over Parkinson's Group June 2023  LOCAL [place] PARKINSON'S GROUPS  Power over Parkinson's Group:   Power Over Parkinson's Patient Education Group will be Wednesday, June 14th-*Hybrid meting*- in person at [facility] location and via [facility] at 2:00 pm.   Upcoming [employer] over [employer] Meetings:  2nd Wednesdays of the month at 2 pm:   June 14th, July 12th Contact Amy Marriott at [email or web address] if interested in participating in this group Parkinson's Care Partners Group:    3rd Mondays, Contact Misty Paladino Atypical Parkinsonian Patient Group:   4th Wednesdays, [place] If you are interested in participating in these groups with Misty, please contact her directly for how to join those meetings.  Her contact information is [email or web address].    LOCAL EVENTS AND NEW OFFERINGS Dance Class for People with Parkinson's at [facility].  Friday, June 9th at 2 pm.  Dora Sims by Tyler Deis DPT students.  Contact [email or web address] to register or with questions. Ice Cream Social at [place]!  Thursday, June 15th, 5:30-7:00 pm.  RSVP to [email or web address] for attendance and free ice cream. Parkinson's T-shirts for sale!  Designed by a local group member, with funds going to [place].  $25.00  Investment banker, corporate to purchase  [employer]! Moves [employer] Instructor-Led Class offering at [employer]!  Wednesdays 1-2 pm, starting April 12th.   Contact Bryson Dames, Acupuncturist at [employer].  [email or web address]  [place]:  [email or web address] PD Health at Home continues:  Mindfulness Mondays, Wellness Wednesdays, Fitness Fridays  Upcoming Education: Parkinson's 101:  What You and Your Family Should Know.  Wednesday, June 7th at 1:00 pm Register for expert briefings (webinars) at  [email or web address] Please check out their website to sign up for emails and see their full online offerings   [place]:  [email or web address]  Third Thursday Webinars:  On the third Thursday of every month at 12 p.m. ET, join our free live webinars to learn about various aspects of living with Parkinson's disease and our work to speed medical breakthroughs. Upcoming Webinar: REPLAY:  From Low Blood Pressure to Bladder Problems:  A Look at Lesser Known Parkinson's Symptoms.  Thursday, June 15th at 12 noon. Check out additional information on their website to see their full online offerings  [facility]:  [email or web address] Upcoming Webinar:   Stay tuned Webinar Series:  Living with Parkinson's Meetup.   Third Thursdays each month, 3 pm Care Partner Monthly Meetup.  With Robin Searing [place].  First Tuesday of each month, 2 pm Check out additional information to Live Well Today on their website  Parkinson and Movement Disorders (PMD) Alliance:  www.[email or web address] [employer] Online:  Online Education Events Sign up for emails, which are sent weekly to give you updates on programming and online offerings  Parkinson's Association of the [place]:  www.[email or web address] Information on online support groups, education events, and online exercises including Yoga, Parkinson's exercises and more-LOTS of information on links to PD resources and online events Virtual Support Group through Parkinson's Association of the [place]; next one is scheduled for Wednesday, June 7th at 2 pm. (These are typically scheduled for the 1st Wednesday of the month at 2 pm).  Visit website for details. Save the date for "Caring for Parkinson's-Caring for You", 9th Annual Symposium.  In-person event in [place].  September 9th.  More info on registration to come. MOVEMENT AND EXERCISE OPPORTUNITIES PWR!  Moves Classes at [facility]  Exercise Room.  Wednesdays 10 and 11 am.   Contact Amy Marriott, PT [email or web address] if interested. NEW PWR! Moves Class offering at [employer].  Wednesdays 1-2 pm, starting April 12th.  Contact Bryson Dames, Acupuncturist at [employer].  [email or web address] Here is a link to the PWR!Moves classes on Zoom from [place] - Daily Mon-Sat at 10:00. Via Zoom, FREE and open to all.  There is also a link below via Facebook if you use that platform.  [email or web address]  Parkinson's Wellness Recovery (PWR! Moves)  www.[email or web address] Info on the PWR! Virtual Experience:  You will have access to our expertise through self-assessment, guided plans that start with the PD-specific fundamentals, educational content, tips, Q&A with an expert, and a growing Art therapist of PD-specific pre-recorded and live exercise classes of varying types and intensity - both physical and cognitive! If that is not enough, we offer 1:1 wellness consultations (in-person or virtual) to personalize your PWR! Research scientist (medical).  [place] Fridays:  As part of the PD Health @ Home program, this free video series focuses each week on one aspect of fitness designed to support people living with Parkinson's.  These weekly videos highlight the [place] recent fitness guidelines for people with Parkinson's disease. ModemGamers.si Dance for PD website is offering free, live-stream classes throughout the week, as well as links to [employer] of classes:  [email or web address]/ Virtual dance and Pilates for Parkinson's classes: Click on the Community Tab> Parkinson's Movement Initiative  Tab.  To register for classes and for more information, visit [email or web address] and click the "community" tab.  [employer] Parkinson's Cycling Classes  [employer]:  Thursdays @ Noon-Live classes at [employer] ([employer] [facility] [email or web address] or [phone]) [employer]: Virtual Classes Mondays and Thursdays Jeanette Caprice classes Tuesday, Wednesday and Thursday (contact [place] at [email or web address]  or [phone]) [place] Varied levels of classes are offered Tuesdays and Thursdays at [facility].  Stretching with Verdis Frederickson weekly class is also offered for people with Parkinson's To observe a class or for more information, call [phone] or email Hezzie Bump at [email or web address] ADDITIONAL SUPPORT AND RESOURCES Well[employer]:Online Caregiver Education Opportunities:  www.well-springsolutions.org/caregiver-education/caregiver-support-group.  You may also contact Vickki Muff at [email or web address] or [phone].    Well-Spring Navigator:  [phone] program, a free service to help individuals and families through the journey of determining care for older adults.  The "Navigator" is a [employer], Education officer, museum, who will speak with a prospective client and/or loved ones to provide an assessment of the situation and a set of recommendations for a personalized care plan -- all free of charge, and whether [facility] offers the needed service or not. If the need is not a service we provide, we are well-connected with reputable programs in town that we can refer you to.  www.well-springsolutions.org or to speak with the Navigator, call [phone]. Family Caregiver Programming in June:  [employer], Thursday, June 15th 11-12:30 at 11-26-1971. [place].  Call [phone] to register

## 2022-04-06 DIAGNOSIS — R269 Unspecified abnormalities of gait and mobility: Secondary | ICD-10-CM | POA: Diagnosis not present

## 2022-04-06 NOTE — Progress Notes (Signed)
Appt scheduled

## 2022-04-11 ENCOUNTER — Ambulatory Visit (INDEPENDENT_AMBULATORY_CARE_PROVIDER_SITE_OTHER): Payer: Medicare Other | Admitting: Obstetrics and Gynecology

## 2022-04-11 ENCOUNTER — Encounter: Payer: Self-pay | Admitting: Obstetrics and Gynecology

## 2022-04-11 VITALS — BP 134/68 | HR 70 | Ht 64.0 in | Wt 217.6 lb

## 2022-04-11 DIAGNOSIS — Z01419 Encounter for gynecological examination (general) (routine) without abnormal findings: Secondary | ICD-10-CM

## 2022-04-11 DIAGNOSIS — R269 Unspecified abnormalities of gait and mobility: Secondary | ICD-10-CM | POA: Diagnosis not present

## 2022-04-11 NOTE — Patient Instructions (Signed)

## 2022-04-18 DIAGNOSIS — R269 Unspecified abnormalities of gait and mobility: Secondary | ICD-10-CM | POA: Diagnosis not present

## 2022-05-02 DIAGNOSIS — R269 Unspecified abnormalities of gait and mobility: Secondary | ICD-10-CM | POA: Diagnosis not present

## 2022-05-07 ENCOUNTER — Encounter: Payer: Self-pay | Admitting: Nurse Practitioner

## 2022-05-07 ENCOUNTER — Ambulatory Visit (INDEPENDENT_AMBULATORY_CARE_PROVIDER_SITE_OTHER): Payer: Medicare Other | Admitting: Nurse Practitioner

## 2022-05-07 VITALS — BP 142/88 | HR 74 | Temp 97.7°F | Ht 64.0 in | Wt 219.2 lb

## 2022-05-07 DIAGNOSIS — D2261 Melanocytic nevi of right upper limb, including shoulder: Secondary | ICD-10-CM | POA: Diagnosis not present

## 2022-05-07 DIAGNOSIS — D2262 Melanocytic nevi of left upper limb, including shoulder: Secondary | ICD-10-CM | POA: Diagnosis not present

## 2022-05-07 DIAGNOSIS — R030 Elevated blood-pressure reading, without diagnosis of hypertension: Secondary | ICD-10-CM | POA: Diagnosis not present

## 2022-05-07 DIAGNOSIS — L821 Other seborrheic keratosis: Secondary | ICD-10-CM | POA: Diagnosis not present

## 2022-05-07 DIAGNOSIS — D2239 Melanocytic nevi of other parts of face: Secondary | ICD-10-CM | POA: Diagnosis not present

## 2022-05-07 DIAGNOSIS — D225 Melanocytic nevi of trunk: Secondary | ICD-10-CM | POA: Diagnosis not present

## 2022-05-07 DIAGNOSIS — L720 Epidermal cyst: Secondary | ICD-10-CM | POA: Diagnosis not present

## 2022-05-07 DIAGNOSIS — F330 Major depressive disorder, recurrent, mild: Secondary | ICD-10-CM | POA: Diagnosis not present

## 2022-05-07 DIAGNOSIS — D2271 Melanocytic nevi of right lower limb, including hip: Secondary | ICD-10-CM | POA: Diagnosis not present

## 2022-05-07 DIAGNOSIS — Z85828 Personal history of other malignant neoplasm of skin: Secondary | ICD-10-CM | POA: Diagnosis not present

## 2022-05-07 DIAGNOSIS — Z85820 Personal history of malignant melanoma of skin: Secondary | ICD-10-CM | POA: Diagnosis not present

## 2022-05-07 DIAGNOSIS — F32A Depression, unspecified: Secondary | ICD-10-CM | POA: Insufficient documentation

## 2022-05-07 DIAGNOSIS — D2272 Melanocytic nevi of left lower limb, including hip: Secondary | ICD-10-CM | POA: Diagnosis not present

## 2022-05-07 DIAGNOSIS — L565 Disseminated superficial actinic porokeratosis (DSAP): Secondary | ICD-10-CM | POA: Diagnosis not present

## 2022-05-07 LAB — COMPREHENSIVE METABOLIC PANEL
ALT: 5 U/L (ref 0–35)
AST: 17 U/L (ref 0–37)
Albumin: 4.4 g/dL (ref 3.5–5.2)
Alkaline Phosphatase: 92 U/L (ref 39–117)
BUN: 18 mg/dL (ref 6–23)
CO2: 27 mEq/L (ref 19–32)
Calcium: 9.4 mg/dL (ref 8.4–10.5)
Chloride: 106 mEq/L (ref 96–112)
Creatinine, Ser: 0.7 mg/dL (ref 0.40–1.20)
GFR: 90.23 mL/min (ref >60.00)
Glucose, Bld: 99 mg/dL (ref 70–99)
Potassium: 4.2 mEq/L (ref 3.5–5.1)
Sodium: 140 mEq/L (ref 135–145)
Total Bilirubin: 0.5 mg/dL (ref 0.2–1.2)
Total Protein: 7.6 g/dL (ref 6.0–8.3)

## 2022-05-07 LAB — CBC
HCT: 39.6 % (ref 36.0–46.0)
Hemoglobin: 13.1 g/dL (ref 12.0–15.0)
MCHC: 33.2 g/dL (ref 30.0–36.0)
MCV: 91.5 fl (ref 78.0–100.0)
Platelets: 202 10*3/uL (ref 150.0–400.0)
RBC: 4.33 Mil/uL (ref 3.87–5.11)
RDW: 14.1 % (ref 11.5–15.5)
WBC: 5.8 10*3/uL (ref 4.0–10.5)

## 2022-05-07 LAB — TSH: TSH: 1.9 u[IU]/mL (ref 0.35–5.50)

## 2022-05-07 NOTE — Patient Instructions (Signed)
Monitor BP daily in Am Send BP reading via mychart in 1week Maintain low sodium diet.  DASH Eating Plan DASH stands for Dietary Approaches to Stop Hypertension. The DASH eating plan is a healthy eating plan that has been shown to: Reduce high blood pressure (hypertension). Reduce your risk for type 2 diabetes, heart disease, and stroke. Help with weight loss. What are tips for following this plan? Reading food labels Check food labels for the amount of salt (sodium) per serving. Choose foods with less than 5 percent of the Daily Value of sodium. Generally, foods with less than 300 milligrams (mg) of sodium per serving fit into this eating plan. To find whole grains, look for the word "whole" as the first word in the ingredient list. Shopping Buy products labeled as "low-sodium" or "no salt added." Buy fresh foods. Avoid canned foods and pre-made or frozen meals. Cooking Avoid adding salt when cooking. Use salt-free seasonings or herbs instead of table salt or sea salt. Check with your health care provider or pharmacist before using salt substitutes. Do not fry foods. Cook foods using healthy methods such as baking, boiling, grilling, roasting, and broiling instead. Cook with heart-healthy oils, such as olive, canola, avocado, soybean, or sunflower oil. Meal planning  Eat a balanced diet that includes: 4 or more servings of fruits and 4 or more servings of vegetables each day. Try to fill one-half of your plate with fruits and vegetables. 6-8 servings of whole grains each day. Less than 6 oz (170 g) of lean meat, poultry, or fish each day. A 3-oz (85-g) serving of meat is about the same size as a deck of cards. One egg equals 1 oz (28 g). 2-3 servings of low-fat dairy each day. One serving is 1 cup (237 mL). 1 serving of nuts, seeds, or beans 5 times each week. 2-3 servings of heart-healthy fats. Healthy fats called omega-3 fatty acids are found in foods such as walnuts, flaxseeds,  fortified milks, and eggs. These fats are also found in cold-water fish, such as sardines, salmon, and mackerel. Limit how much you eat of: Canned or prepackaged foods. Food that is high in trans fat, such as some fried foods. Food that is high in saturated fat, such as fatty meat. Desserts and other sweets, sugary drinks, and other foods with added sugar. Full-fat dairy products. Do not salt foods before eating. Do not eat more than 4 egg yolks a week. Try to eat at least 2 vegetarian meals a week. Eat more home-cooked food and less restaurant, buffet, and fast food. Lifestyle When eating at a restaurant, ask that your food be prepared with less salt or no salt, if possible. If you drink alcohol: Limit how much you use to: 0-1 drink a day for women who are not pregnant. 0-2 drinks a day for men. Be aware of how much alcohol is in your drink. In the [place] one drink equals one 12 oz bottle of beer (355 mL), one 5 oz glass of wine (148 mL), or one 1 oz glass of hard liquor (44 mL). General information Avoid eating more than 2,300 mg of salt a day. If you have hypertension, you may need to reduce your sodium intake to 1,500 mg a day. Work with your health care provider to maintain a healthy body weight or to lose weight. Ask what an ideal weight is for you. Get at least 30 minutes of exercise that causes your heart to beat faster (aerobic exercise) most days of the  week. Activities may include walking, swimming, or biking. Work with your health care provider or dietitian to adjust your eating plan to your individual calorie needs. What foods should I eat? Fruits All fresh, dried, or frozen fruit. Canned fruit in natural juice (without added sugar). Vegetables Fresh or frozen vegetables (raw, steamed, roasted, or grilled). Low-sodium or reduced-sodium tomato and vegetable juice. Low-sodium or reduced-sodium tomato sauce and tomato paste. Low-sodium or reduced-sodium canned  vegetables. Grains Whole-grain or whole-wheat bread. Whole-grain or whole-wheat pasta. Brown rice. [place]. Bulgur. Whole-grain and low-sodium cereals. Pita bread. Low-fat, low-sodium crackers. Whole-wheat flour tortillas. Meats and other proteins Skinless chicken or [place]. Ground chicken or [place]. Pork with fat trimmed off. Fish and seafood. Egg whites. Dried beans, peas, or lentils. Unsalted nuts, nut butters, and seeds. Unsalted canned beans. Lean cuts of beef with fat trimmed off. Low-sodium, lean precooked or cured meat, such as sausages or meat loaves. Dairy Low-fat (1%) or fat-free (skim) milk. Reduced-fat, low-fat, or fat-free cheeses. Nonfat, low-sodium ricotta or cottage cheese. Low-fat or nonfat yogurt. Low-fat, low-sodium cheese. Fats and oils Soft margarine without trans fats. Vegetable oil. Reduced-fat, low-fat, or light mayonnaise and salad dressings (reduced-sodium). Canola, safflower, olive, avocado, soybean, and sunflower oils. Avocado. Seasonings and condiments Herbs. Spices. Seasoning mixes without salt. Other foods Unsalted popcorn and pretzels. Fat-free sweets. The items listed above may not be a complete list of foods and beverages you can eat. Contact a dietitian for more information. What foods should I avoid? Fruits Canned fruit in a light or heavy syrup. Fried fruit. Fruit in cream or butter sauce. Vegetables Creamed or fried vegetables. Vegetables in a cheese sauce. Regular canned vegetables (not low-sodium or reduced-sodium). Regular canned tomato sauce and paste (not low-sodium or reduced-sodium). Regular tomato and vegetable juice (not low-sodium or reduced-sodium). Angie Fava. Olives. Grains Baked goods made with fat, such as croissants, muffins, or some breads. Dry pasta or rice meal packs. Meats and other proteins Fatty cuts of meat. Ribs. Fried meat. Berniece Salines. Bologna, salami, and other precooked or cured meats, such as sausages or meat loaves. Fat from  the back of a pig (fatback). Bratwurst. Salted nuts and seeds. Canned beans with added salt. Canned or smoked fish. Whole eggs or egg yolks. Chicken or [place] with skin. Dairy Whole or 2% milk, cream, and half-and-half. Whole or full-fat cream cheese. Whole-fat or sweetened yogurt. Full-fat cheese. Nondairy creamers. Whipped toppings. Processed cheese and cheese spreads. Fats and oils Butter. Stick margarine. Lard. Shortening. Ghee. Bacon fat. Tropical oils, such as coconut, palm kernel, or palm oil. Seasonings and condiments Onion salt, garlic salt, seasoned salt, table salt, and sea salt. Worcestershire sauce. Tartar sauce. Barbecue sauce. Teriyaki sauce. Soy sauce, including reduced-sodium. Steak sauce. Canned and packaged gravies. Fish sauce. Oyster sauce. Cocktail sauce. Store-bought horseradish. Ketchup. Mustard. Meat flavorings and tenderizers. Bouillon cubes. Hot sauces. Pre-made or packaged marinades. Pre-made or packaged taco seasonings. Relishes. Regular salad dressings. Other foods Salted popcorn and pretzels. The items listed above may not be a complete list of foods and beverages you should avoid. Contact a dietitian for more information. Where to find more information National Heart, Lung, and Blood Institute: https://wilson-eaton.com/ American Heart Association: www.heart.org Academy of Nutrition and Dietetics: www.eatright.[place]: www.kidney.org Summary The DASH eating plan is a healthy eating plan that has been shown to reduce high blood pressure (hypertension). It may also reduce your risk for type 2 diabetes, heart disease, and stroke. When on the DASH eating plan, aim to eat  more fresh fruits and vegetables, whole grains, lean proteins, low-fat dairy, and heart-healthy fats. With the DASH eating plan, you should limit salt (sodium) intake to 2,300 mg a day. If you have hypertension, you may need to reduce your sodium intake to 1,500 mg a day. Work with your  health care provider or dietitian to adjust your eating plan to your individual calorie needs. This information is not intended to replace advice given to you by your health care provider. Make sure you discuss any questions you have with your health care provider. Document Revised: 08/28/2019 Document Reviewed: 08/28/2019 Elsevier Patient Education  [place].

## 2022-05-07 NOTE — Assessment & Plan Note (Signed)
Associated with current health and care of sister with alzheimer's dementia. She has ongoing sessions with therapist which have been helpful: [facility]. Continue to monitor at this time

## 2022-05-07 NOTE — Progress Notes (Signed)
Established Patient Visit  Patient: Sara Nicholson   DOB: [birth year]   66 y.o. Female  MRN: [id number] Visit Date: 05/07/2022  Subjective:    Chief Complaint  Patient presents with   Office Visit    HTN F/u Doesn't check BP  No concerns    HPI Depression, major, recurrent, mild ([place]) Associated with current health and care of sister with alzheimer's dementia. She has ongoing sessions with therapist which have been helpful: [facility]. Continue to monitor at this time  Elevated BP without diagnosis of hypertension Home BP: 120s/80s-130s/80s No headache, no dizziness, no Le edema. She maintains low sodium diet and daily exercise. BP Readings from Last 3 Encounters:  05/07/22 (!) 142/88  04/11/22 134/68  04/05/22 (!) 150/82   Advised to continue monitoring BP at home Send reading vis mychart in AM Check CMl, TSH, CBC today  BP Readings from Last 3 Encounters:  05/07/22 (!) 142/88  04/11/22 134/68  04/05/22 (!) 150/82    Reviewed medical, surgical, and social history today  Medications: Outpatient Medications Prior to Visit  Medication Sig   carbidopa-levodopa (SINEMET IR) 25-100 MG tablet Take 1 tablet by mouth 3 (three) times daily. 7am/11am/4pm   escitalopram (LEXAPRO) 20 MG tablet TAKE ONE TABLET BY MOUTH ONE TIME DAILY   pramipexole (MIRAPEX) 0.5 MG tablet TAKE ONE TABLET BY MOUTH AT 8 IN THE MORNING, ONE TABLET AT 1:00PM , AND ONE TABLET AT 6PM.   Facility-Administered Medications Prior to Visit  Medication Dose Route Frequency Provider   0.9 %  sodium chloride infusion  500 mL Intravenous Once Doran Stabler, MD   Reviewed past medical and social history.   ROS per HPI above      Objective:  BP (!) 142/88 (BP Location: Right Arm, Patient Position: Sitting, Cuff Size: Normal)   Pulse 74   Temp 97.7 F (36.5 C) (Temporal)   Ht '5\' 4"'$  (1.626 m)   Wt 219 lb 3.2 oz (99.4 kg)   LMP 06/08/2010   SpO2 96%   BMI  37.63 kg/m      Physical Exam Cardiovascular:     Rate and Rhythm: Normal rate and regular rhythm.     Pulses: Normal pulses.     Heart sounds: Normal heart sounds.  Pulmonary:     Effort: Pulmonary effort is normal.  Neurological:     Mental Status: She is alert and oriented to person, place, and time.  Psychiatric:        Mood and Affect: Mood normal.        Behavior: Behavior normal.        Thought Content: Thought content normal.     No results found for any visits on 05/07/22.    Assessment & Plan:    Problem List Items Addressed This Visit       Other   Depression, major, recurrent, mild ([place])    Associated with current health and care of sister with alzheimer's dementia. She has ongoing sessions with therapist which have been helpful: [place]. Continue to monitor at this time      Elevated BP without diagnosis of hypertension - Primary    Home BP: 120s/80s-130s/80s No headache, no dizziness, no Le edema. She maintains low sodium diet and daily exercise. BP Readings from Last 3 Encounters:  05/07/22 (!) 142/88  04/11/22 134/68  04/05/22 (!) 150/82   Advised  to continue monitoring BP at home Send reading vis mychart in AM Check CMl, TSH, CBC today      Relevant Orders   Comprehensive metabolic panel   TSH   CBC   Return in about 3 months (around 08/07/2022) for elevated BP.     Wilfred Lacy, NP

## 2022-05-07 NOTE — Assessment & Plan Note (Signed)
Home BP: 120s/80s-130s/80s No headache, no dizziness, no Le edema. She maintains low sodium diet and daily exercise. BP Readings from Last 3 Encounters:  05/07/22 (!) 142/88  04/11/22 134/68  04/05/22 (!) 150/82   Advised to continue monitoring BP at home Send reading vis mychart in AM Check CMl, TSH, CBC today

## 2022-05-08 ENCOUNTER — Encounter: Payer: Self-pay | Admitting: Nurse Practitioner

## 2022-05-09 DIAGNOSIS — R269 Unspecified abnormalities of gait and mobility: Secondary | ICD-10-CM | POA: Diagnosis not present

## 2022-05-23 DIAGNOSIS — R269 Unspecified abnormalities of gait and mobility: Secondary | ICD-10-CM | POA: Diagnosis not present

## 2022-05-30 DIAGNOSIS — R269 Unspecified abnormalities of gait and mobility: Secondary | ICD-10-CM | POA: Diagnosis not present

## 2022-06-06 DIAGNOSIS — R269 Unspecified abnormalities of gait and mobility: Secondary | ICD-10-CM | POA: Diagnosis not present

## 2022-06-13 DIAGNOSIS — R269 Unspecified abnormalities of gait and mobility: Secondary | ICD-10-CM | POA: Diagnosis not present

## 2022-06-20 DIAGNOSIS — R269 Unspecified abnormalities of gait and mobility: Secondary | ICD-10-CM | POA: Diagnosis not present

## 2022-06-25 DIAGNOSIS — R269 Unspecified abnormalities of gait and mobility: Secondary | ICD-10-CM | POA: Diagnosis not present

## 2022-07-04 DIAGNOSIS — R269 Unspecified abnormalities of gait and mobility: Secondary | ICD-10-CM | POA: Diagnosis not present

## 2022-07-08 ENCOUNTER — Other Ambulatory Visit: Payer: Self-pay | Admitting: Neurology

## 2022-07-09 ENCOUNTER — Other Ambulatory Visit: Payer: Self-pay

## 2022-07-11 DIAGNOSIS — R269 Unspecified abnormalities of gait and mobility: Secondary | ICD-10-CM | POA: Diagnosis not present

## 2022-07-18 DIAGNOSIS — R269 Unspecified abnormalities of gait and mobility: Secondary | ICD-10-CM | POA: Diagnosis not present

## 2022-07-25 DIAGNOSIS — R269 Unspecified abnormalities of gait and mobility: Secondary | ICD-10-CM | POA: Diagnosis not present

## 2022-08-01 DIAGNOSIS — R269 Unspecified abnormalities of gait and mobility: Secondary | ICD-10-CM | POA: Diagnosis not present

## 2022-08-07 ENCOUNTER — Ambulatory Visit (INDEPENDENT_AMBULATORY_CARE_PROVIDER_SITE_OTHER): Payer: Medicare Other | Admitting: Nurse Practitioner

## 2022-08-07 ENCOUNTER — Encounter: Payer: Self-pay | Admitting: Nurse Practitioner

## 2022-08-07 VITALS — BP 128/70 | HR 68 | Temp 97.4°F | Ht 64.0 in | Wt 221.0 lb

## 2022-08-07 DIAGNOSIS — R030 Elevated blood-pressure reading, without diagnosis of hypertension: Secondary | ICD-10-CM | POA: Diagnosis not present

## 2022-08-07 NOTE — Progress Notes (Signed)
                Established Patient Visit  Patient: Sara Nicholson   DOB: 27-Nov-1955   66 y.o. Female  MRN: [id number] Visit Date: 08/07/2022  Subjective:    Chief Complaint  Patient presents with   Office Visit    HTN f/u Pt fasting Doesn't check BP often  No concerns  Pneumonia vaccine info    HPI Elevated BP without diagnosis of hypertension Normal BP Continue to monitror BP 1-2x/week Call office if BP >140/80 BP Readings from Last 3 Encounters:  08/07/22 128/70  05/07/22 (!) 142/88  04/11/22 134/68   Reviewed medical, surgical, and social history today  Medications: Outpatient Medications Prior to Visit  Medication Sig   carbidopa-levodopa (SINEMET IR) 25-100 MG tablet Take 1 tablet by mouth 3 (three) times daily. 7am/11am/4pm   escitalopram (LEXAPRO) 20 MG tablet TAKE ONE TABLET BY MOUTH ONE TIME DAILY   pramipexole (MIRAPEX) 0.5 MG tablet TAKE ONE TABLET BY MOUTH AT 8AM, ONE TABLET AT 1PM., AND ONE TABLET AT 6PM   Facility-Administered Medications Prior to Visit  Medication Dose Route Frequency Provider   0.9 %  sodium chloride infusion  500 mL Intravenous Once Doran Stabler, MD   Reviewed past medical and social history.   ROS per HPI above      Objective:  BP 128/70   Pulse 68   Temp (!) 97.4 F (36.3 C) (Temporal)   Ht '5\' 4"'$  (1.626 m)   Wt 221 lb (100.2 kg)   LMP 06/08/2010   SpO2 94%   BMI 37.93 kg/m      Physical Exam Cardiovascular:     Rate and Rhythm: Normal rate.     Pulses: Normal pulses.  Pulmonary:     Effort: Pulmonary effort is normal.  Musculoskeletal:     Right lower leg: No edema.     Left lower leg: No edema.  Neurological:     Mental Status: She is alert and oriented to person, place, and time.     No results found for any visits on 08/07/22.    Assessment & Plan:    Problem List Items Addressed This Visit       Other   Elevated BP without diagnosis of hypertension - Primary    Normal BP Continue to monitror  BP 1-2x/week Call office if BP >140/80 BP Readings from Last 3 Encounters:  08/07/22 128/70  05/07/22 (!) 142/88  04/11/22 134/68        Return in about 3 months (around 11/07/2022) for hyperglycemia and BP, hyperlipidemia (fasting).     Wilfred Lacy, NP

## 2022-08-07 NOTE — Assessment & Plan Note (Signed)
Normal BP Continue to monitror BP 1-2x/week Call office if BP >140/80 BP Readings from Last 3 Encounters:  08/07/22 128/70  05/07/22 (!) 142/88  04/11/22 134/68

## 2022-08-08 DIAGNOSIS — R269 Unspecified abnormalities of gait and mobility: Secondary | ICD-10-CM | POA: Diagnosis not present

## 2022-08-15 DIAGNOSIS — R269 Unspecified abnormalities of gait and mobility: Secondary | ICD-10-CM | POA: Diagnosis not present

## 2022-08-22 DIAGNOSIS — R269 Unspecified abnormalities of gait and mobility: Secondary | ICD-10-CM | POA: Diagnosis not present

## 2022-08-29 DIAGNOSIS — M25551 Pain in right hip: Secondary | ICD-10-CM | POA: Diagnosis not present

## 2022-08-29 DIAGNOSIS — M25552 Pain in left hip: Secondary | ICD-10-CM | POA: Diagnosis not present

## 2022-08-29 DIAGNOSIS — M542 Cervicalgia: Secondary | ICD-10-CM | POA: Diagnosis not present

## 2022-08-29 DIAGNOSIS — M25569 Pain in unspecified knee: Secondary | ICD-10-CM | POA: Diagnosis not present

## 2022-09-05 DIAGNOSIS — M542 Cervicalgia: Secondary | ICD-10-CM | POA: Diagnosis not present

## 2022-09-05 DIAGNOSIS — M25552 Pain in left hip: Secondary | ICD-10-CM | POA: Diagnosis not present

## 2022-09-05 DIAGNOSIS — M25569 Pain in unspecified knee: Secondary | ICD-10-CM | POA: Diagnosis not present

## 2022-09-05 DIAGNOSIS — M25551 Pain in right hip: Secondary | ICD-10-CM | POA: Diagnosis not present

## 2022-09-06 ENCOUNTER — Telehealth: Payer: Self-pay | Admitting: Nurse Practitioner

## 2022-09-06 NOTE — Telephone Encounter (Signed)
Left message for patient to call back and schedule Medicare Annual Wellness Visit (AWV) in office.   If not able to come in office, please offer to do virtually or by telephone.  Left office number and my jabber [phone].  Last AWV:09/05/2021   Please schedule at anytime with Nurse Health Advisor.

## 2022-09-10 ENCOUNTER — Other Ambulatory Visit: Payer: Self-pay | Admitting: Neurology

## 2022-09-10 DIAGNOSIS — G20A1 Parkinson's disease without dyskinesia, without mention of fluctuations: Secondary | ICD-10-CM

## 2022-09-12 DIAGNOSIS — M542 Cervicalgia: Secondary | ICD-10-CM | POA: Diagnosis not present

## 2022-09-12 DIAGNOSIS — M25559 Pain in unspecified hip: Secondary | ICD-10-CM | POA: Diagnosis not present

## 2022-09-17 ENCOUNTER — Ambulatory Visit (INDEPENDENT_AMBULATORY_CARE_PROVIDER_SITE_OTHER): Payer: Medicare Other

## 2022-09-17 VITALS — Ht 64.0 in | Wt 216.0 lb

## 2022-09-17 DIAGNOSIS — Z0000 Encounter for general adult medical examination without abnormal findings: Secondary | ICD-10-CM

## 2022-09-17 NOTE — Patient Instructions (Addendum)
[name] , Thank you for taking time to come for your Medicare Wellness Visit. I appreciate your ongoing commitment to your health goals. Please review the following plan we discussed and let me know if I can assist you in the future.   These are the goals we discussed:  Goals      Patient Stated     09/17/2022, wants to stay balanced and mobile        This is a list of the screening recommended for you and due dates:  Health Maintenance  Topic Date Due   Hepatitis C Screening: USPSTF Recommendation to screen - Ages 42-79 yo.  Never done   Zoster (Shingles) Vaccine (1 of 2) Never done   Pneumonia Vaccine (1 - PCV) Never done   COVID-19 Vaccine (5 - 2023-24 season) 06/08/2022   Mammogram  12/15/2022   Medicare Annual Wellness Visit  09/18/2023   DTaP/Tdap/Td vaccine (5 - Td or Tdap) 03/21/2028   Colon Cancer Screening  06/02/2028   Flu Shot  Completed   DEXA scan (bone density measurement)  Completed   HPV Vaccine  Aged Out    Advanced directives: Please bring a copy of your POA (Power of [place]) and/or Living Will to your next appointment.   Conditions/risks identified: none  Next appointment: Follow up in one year for your annual wellness visit    Preventive Care 65 Years and Older, Female Preventive care refers to lifestyle choices and visits with your health care provider that can promote health and wellness. What does preventive care include? A yearly physical exam. This is also called an annual well check. Dental exams once or twice a year. Routine eye exams. Ask your health care provider how often you should have your eyes checked. Personal lifestyle choices, including: Daily care of your teeth and gums. Regular physical activity. Eating a healthy diet. Avoiding tobacco and drug use. Limiting alcohol use. Practicing safe sex. Taking low-dose aspirin every day. Taking vitamin and mineral supplements as recommended by your health care provider. What happens  during an annual well check? The services and screenings done by your health care provider during your annual well check will depend on your age, overall health, lifestyle risk factors, and family history of disease. Counseling  Your health care provider may ask you questions about your: Alcohol use. Tobacco use. Drug use. Emotional well-being. Home and relationship well-being. Sexual activity. Eating habits. History of falls. Memory and ability to understand (cognition). Work and work Statistician. Reproductive health. Screening  You may have the following tests or measurements: Height, weight, and BMI. Blood pressure. Lipid and cholesterol levels. These may be checked every 5 years, or more frequently if you are over 62 years old. Skin check. Lung cancer screening. You may have this screening every year starting at age 53 if you have a 30-pack-year history of smoking and currently smoke or have quit within the past 15 years. Fecal occult blood test (FOBT) of the stool. You may have this test every year starting at age 97. Flexible sigmoidoscopy or colonoscopy. You may have a sigmoidoscopy every 5 years or a colonoscopy every 10 years starting at age 56. Hepatitis C blood test. Hepatitis B blood test. Sexually transmitted disease (STD) testing. Diabetes screening. This is done by checking your blood sugar (glucose) after you have not eaten for a while (fasting). You may have this done every 1-3 years. Bone density scan. This is done to screen for osteoporosis. You may have this done starting  at age 21. Mammogram. This may be done every 1-2 years. Talk to your health care provider about how often you should have regular mammograms. Talk with your health care provider about your test results, treatment options, and if necessary, the need for more tests. Vaccines  Your health care provider may recommend certain vaccines, such as: Influenza vaccine. This is recommended every  year. Tetanus, diphtheria, and acellular pertussis (Tdap, Td) vaccine. You may need a Td booster every 10 years. Zoster vaccine. You may need this after age 22. Pneumococcal 13-valent conjugate (PCV13) vaccine. One dose is recommended after age 41. Pneumococcal polysaccharide (PPSV23) vaccine. One dose is recommended after age 15. Talk to your health care provider about which screenings and vaccines you need and how often you need them. This information is not intended to replace advice given to you by your health care provider. Make sure you discuss any questions you have with your health care provider. Document Released: 10/21/2015 Document Revised: 06/13/2016 Document Reviewed: 07/26/2015 Elsevier Interactive Patient Education  2017 [place] Prevention in the Home Falls can cause injuries. They can happen to people of all ages. There are many things you can do to make your home safe and to help prevent falls. What can I do on the outside of my home? Regularly fix the edges of walkways and driveways and fix any cracks. Remove anything that might make you trip as you walk through a door, such as a raised step or threshold. Trim any bushes or trees on the path to your home. Use bright outdoor lighting. Clear any walking paths of anything that might make someone trip, such as rocks or tools. Regularly check to see if handrails are loose or broken. Make sure that both sides of any steps have handrails. Any raised decks and porches should have guardrails on the edges. Have any leaves, snow, or ice cleared regularly. Use sand or salt on walking paths during winter. Clean up any spills in your garage right away. This includes oil or grease spills. What can I do in the bathroom? Use night lights. Install grab bars by the toilet and in the tub and shower. Do not use towel bars as grab bars. Use non-skid mats or decals in the tub or shower. If you need to sit down in the shower, use a  plastic, non-slip stool. Keep the floor dry. Clean up any water that spills on the floor as soon as it happens. Remove soap buildup in the tub or shower regularly. Attach bath mats securely with double-sided non-slip rug tape. Do not have throw rugs and other things on the floor that can make you trip. What can I do in the bedroom? Use night lights. Make sure that you have a light by your bed that is easy to reach. Do not use any sheets or blankets that are too big for your bed. They should not hang down onto the floor. Have a firm chair that has side arms. You can use this for support while you get dressed. Do not have throw rugs and other things on the floor that can make you trip. What can I do in the kitchen? Clean up any spills right away. Avoid walking on wet floors. Keep items that you use a lot in easy-to-reach places. If you need to reach something above you, use a strong step stool that has a grab bar. Keep electrical cords out of the way. Do not use floor polish or wax that makes  floors slippery. If you must use wax, use non-skid floor wax. Do not have throw rugs and other things on the floor that can make you trip. What can I do with my stairs? Do not leave any items on the stairs. Make sure that there are handrails on both sides of the stairs and use them. Fix handrails that are broken or loose. Make sure that handrails are as long as the stairways. Check any carpeting to make sure that it is firmly attached to the stairs. Fix any carpet that is loose or worn. Avoid having throw rugs at the top or bottom of the stairs. If you do have throw rugs, attach them to the floor with carpet tape. Make sure that you have a light switch at the top of the stairs and the bottom of the stairs. If you do not have them, ask someone to add them for you. What else can I do to help prevent falls? Wear shoes that: Do not have high heels. Have rubber bottoms. Are comfortable and fit you  well. Are closed at the toe. Do not wear sandals. If you use a stepladder: Make sure that it is fully opened. Do not climb a closed stepladder. Make sure that both sides of the stepladder are locked into place. Ask someone to hold it for you, if possible. Clearly mark and make sure that you can see: Any grab bars or handrails. First and last steps. Where the edge of each step is. Use tools that help you move around (mobility aids) if they are needed. These include: Canes. Walkers. Scooters. Crutches. Turn on the lights when you go into a dark area. Replace any light bulbs as soon as they burn out. Set up your furniture so you have a clear path. Avoid moving your furniture around. If any of your floors are uneven, fix them. If there are any pets around you, be aware of where they are. Review your medicines with your doctor. Some medicines can make you feel dizzy. This can increase your chance of falling. Ask your doctor what other things that you can do to help prevent falls. This information is not intended to replace advice given to you by your health care provider. Make sure you discuss any questions you have with your health care provider. Document Released: 07/21/2009 Document Revised: 03/01/2016 Document Reviewed: 10/29/2014 Elsevier Interactive Patient Education  2017 [employer].

## 2022-09-17 NOTE — Progress Notes (Signed)
I connected with Saren Corkern today by telephone and verified that I am speaking with the correct person using two identifiers. Location patient: home Location provider: work Persons participating in the virtual visit: Sara Nicholson, Sara Nicholson.   I discussed the limitations, risks, security and privacy concerns of performing an evaluation and management service by telephone and the availability of in person appointments. I also discussed with the patient that there may be a patient responsible charge related to this service. The patient expressed understanding and verbally consented to this telephonic visit.    Interactive audio and video telecommunications were attempted between this provider and patient, however failed, due to patient having technical difficulties OR patient did not have access to video capability.  We continued and completed visit with audio only.     Vital signs may be patient reported or missing.  Subjective:   Sara Nicholson is a 66 y.o. female who presents for Medicare Annual (Subsequent) preventive examination.  Review of Systems     Cardiac Risk Factors include: advanced age (>51mn, >>53women);obesity (BMI >30kg/m2)     Objective:    Today's Vitals   09/17/22 0811  Weight: 216 lb (98 kg)  Height: '5\' 4"'$  (1.626 m)   Body mass index is 37.08 kg/m.     09/17/2022    8:15 AM 04/05/2022   10:44 AM 10/31/2021    8:41 AM 09/05/2021   10:41 AM 05/05/2021    9:12 AM 06/21/2020   12:58 PM 05/18/2020    9:09 AM  Advanced Directives  Does Patient Have a Medical Advance Directive? Yes Yes Yes Yes Yes Yes Yes  Type of AParamedicof [place] will [place] will;Out of facility DNR (pink MOST or yellow form) Living will [place]Living will [place]Living will;Out of facility DNR (pink MOST or yellow form) HH[place]Living will;Out of facility  DNR (pink MOST or yellow form) HGlen GardnerLiving will  Copy of [place] Chart? No - copy requested   No - copy requested       Current Medications (verified) Outpatient Encounter Medications as of 09/17/2022  Medication Sig   carbidopa-levodopa (SINEMET IR) 25-100 MG tablet TAKE ONE TABLET BY MOUTH THREE TIMES A DAY (7AM/11AM/4PM)   escitalopram (LEXAPRO) 20 MG tablet TAKE ONE TABLET BY MOUTH ONE TIME DAILY   pramipexole (MIRAPEX) 0.5 MG tablet TAKE ONE TABLET BY MOUTH AT 8AM, ONE TABLET AT 1PM., AND ONE TABLET AT 6PM   Facility-Administered Encounter Medications as of 09/17/2022  Medication   0.9 %  sodium chloride infusion    Allergies (verified) Terbinafine hcl   History: Past Medical History:  Diagnosis Date   Depression    Past Surgical History:  Procedure Laterality Date   APPENDECTOMY     BREAST BIOPSY Left    BREAST EXCISIONAL BIOPSY Left    BREAST SURGERY     CESAREAN SECTION     COLONOSCOPY     Family History  Problem Relation Age of Onset   Heart attack Father    Heart attack Brother    Cancer Brother        Mass on L5   Diabetes Maternal Grandmother    Heart attack Maternal Grandfather    Heart attack Paternal Grandfather    Cancer Brother    Lung cancer Brother    Cancer - Lung Brother        mets to  esophagus and brain   Neurologic Disorder Sister 74       mild cognitive disorder   Neuromuscular disorder Mother 44       unknown diagnosis   Colon cancer Neg Hx    Colon polyps Neg Hx    Stomach cancer Neg Hx    Rectal cancer Neg Hx    Social History   Socioeconomic History   Marital status: Married    Spouse name: Not on file   Number of children: Not on file   Years of education: Not on file   Highest education level: Not on file  Occupational History   Not on file  Tobacco Use   Smoking status: Never   Smokeless tobacco: Never  Vaping Use   Vaping Use: Never used  Substance and Sexual Activity    Alcohol use: No   Drug use: No   Sexual activity: Yes    Partners: Male    Birth control/protection: Post-menopausal  Other Topics Concern   Not on file  Social History Narrative   Right Handed   Lives in a one story with basement   Drinks Caffeine    Social Determinants of Health   Financial Resource Strain: Low Risk  (09/17/2022)   Overall Financial Resource Strain (CARDIA)    Difficulty of Paying Living Expenses: Not hard at all  Food Insecurity: No Food Insecurity (09/17/2022)   Hunger Vital Sign    Worried About Running Out of Food in the Last Year: Never true    Ran Out of Food in the Last Year: Never true  Transportation Needs: No Transportation Needs (09/17/2022)   PRAPARE - Hydrologist (Medical): No    Lack of Transportation (Non-Medical): No  Physical Activity: Sufficiently Active (09/17/2022)   Exercise Vital Sign    Days of Exercise per Week: 7 days    Minutes of Exercise per Session: 50 min  Stress: No Stress Concern Present (09/17/2022)   [place]    Feeling of Stress : Not at all  Social Connections: [place] (09/05/2021)   Social Connection and Isolation Panel [NHANES]    Frequency of Communication with Friends and Family: Twice a week    Frequency of Social Gatherings with Friends and Family: Twice a week    Attends Religious Services: More than 4 times per year    Active Member of [employer] or Organizations: Yes    Attends Music therapist: More than 4 times per year    Marital Status: Married    Tobacco Counseling Counseling given: Not Answered   Clinical Intake:  Pre-visit preparation completed: Yes  Pain : No/denies pain     Nutritional Status: BMI > 30  Obese Nutritional Risks: None Diabetes: No  How often do you need to have someone help you when you read instructions, pamphlets, or other written materials from your  doctor or pharmacy?: 1 - Never  Diabetic? no  Interpreter Needed?: No  Information entered by :: NAllen Nicholson   Activities of Daily Living    09/17/2022    8:15 AM 09/17/2022    6:42 AM  In your present state of health, do you have any difficulty performing the following activities:  Hearing? 0 0  Vision? 0 0  Difficulty concentrating or making decisions? 0 0  Walking or climbing stairs? 0 0  Dressing or bathing? 0 0  Doing errands, shopping? 0 0  Preparing Food and  eating ? N N  Using the Toilet? N N  In the past six months, have you accidently leaked urine? N N  Do you have problems with loss of bowel control? N N  Managing your Medications? N N  Managing your Finances? N N  Housekeeping or managing your Housekeeping? N N    Patient Care Team: Nche, Charlene Brooke, NP as PCP - General (Internal Medicine) Salvadore Dom, MD as Consulting Physician (Obstetrics and Gynecology) Tat, Eustace Quail, DO as Consulting Physician (Neurology)  Indicate any recent Medical Services you may have received from other than [facility] providers in the past year (date may be approximate).     Assessment:   This is a routine wellness examination for Sara Nicholson.  Hearing/Vision screen Vision Screening - Comments:: Regular eye exams, Dr. Marica Otter  Dietary issues and exercise activities discussed: Current Exercise Habits: Structured exercise class, Type of exercise: yoga;calisthenics;strength training/weights, Time (Minutes): 50, Frequency (Times/Week): 7, Weekly Exercise (Minutes/Week): 350   Goals Addressed             This Visit's Progress    Patient Stated       09/17/2022, wants to stay balanced and mobile       Depression Screen    09/17/2022    8:15 AM 08/07/2022   11:40 AM 09/05/2021   10:39 AM 03/16/2020    2:10 PM 03/16/2020    1:06 PM  PHQ 2/9 Scores  PHQ - 2 Score 0 0 0 0 0  PHQ- 9 Score  2  0     Fall Risk    09/17/2022    8:15 AM 09/17/2022    6:42 AM  04/05/2022   10:44 AM 10/31/2021    8:41 AM 09/05/2021   10:42 AM  Fall Risk   Falls in the past year? 0 0 0 0 0  Number falls in past yr: 0   0 0  Injury with Fall? 0  0 0 0  Risk for fall due to : Medication side effect      Follow up Falls prevention discussed;Education provided;Falls evaluation completed    Falls evaluation completed    FALL RISK PREVENTION PERTAINING TO THE HOME:  Any stairs in or around the home? Yes  If so, are there any without handrails? No  Home free of loose throw rugs in walkways, pet beds, electrical cords, etc? Yes  Adequate lighting in your home to reduce risk of falls? Yes   ASSISTIVE DEVICES UTILIZED TO PREVENT FALLS:  Life alert? No  Use of a cane, walker or w/c? No  Grab bars in the bathroom? No  Shower chair or bench in shower? No  Elevated toilet seat or a handicapped toilet? Yes   TIMED UP AND GO:  Was the test performed? No .      Cognitive Function:        09/17/2022    8:16 AM  6CIT Screen  What Year? 0 points  What month? 0 points  What time? 0 points  Count back from 20 0 points  Months in reverse 0 points  Repeat phrase 2 points  Total Score 2 points    Immunizations Immunization History  Administered Date(s) Administered   DTaP 03/02/2009   Influenza, High Dose Seasonal PF 07/27/2022   Influenza-Unspecified 07/16/2014, 07/12/2015, 07/18/2016, 08/11/2019, 07/02/2020, 07/02/2021   Moderna Sars-Covid-2 Vaccination 12/31/2019, 01/28/2020, 09/17/2020, 04/30/2021   Td 10/08/1996, 03/02/2009   Tdap 03/21/2018    TDAP status: Up to date  Flu Vaccine status: Up to date  Pneumococcal vaccine status: Due, Education has been provided regarding the importance of this vaccine. Advised may receive this vaccine at local pharmacy or Health Dept. Aware to provide a copy of the vaccination record if obtained from local pharmacy or Health Dept. Verbalized acceptance and understanding.  Covid-19 vaccine status: Completed  vaccines  Qualifies for Shingles Vaccine? Yes   Zostavax completed No   Shingrix Completed?: No.    Education has been provided regarding the importance of this vaccine. Patient has been advised to call insurance company to determine out of pocket expense if they have not yet received this vaccine. Advised may also receive vaccine at local pharmacy or Health Dept. Verbalized acceptance and understanding.  Screening Tests Health Maintenance  Topic Date Due   Hepatitis C Screening  Never done   Zoster Vaccines- Shingrix (1 of 2) Never done   Pneumonia Vaccine 86+ Years old (1 - PCV) Never done   COVID-19 Vaccine (5 - 2023-24 season) 06/08/2022   Medicare Annual Wellness (AWV)  09/05/2022   MAMMOGRAM  12/15/2022   DTaP/Tdap/Td (5 - Td or Tdap) 03/21/2028   COLONOSCOPY (Pts 45-64yr Insurance coverage will need to be confirmed)  06/02/2028   INFLUENZA VACCINE  Completed   DEXA SCAN  Completed   HPV VACCINES  Aged Out    Health Maintenance  Health Maintenance Due  Topic Date Due   Hepatitis C Screening  Never done   Zoster Vaccines- Shingrix (1 of 2) Never done   Pneumonia Vaccine 66 Years old (1 - PCV) Never done   COVID-19 Vaccine (5 - 2023-24 season) 06/08/2022   Medicare Annual Wellness (AWV)  09/05/2022    Colorectal cancer screening: Type of screening: Colonoscopy. Completed 06/02/2018. Repeat every 10 years  Mammogram status: Completed 12/14/2021. Repeat every year  Bone Density status: Completed 03/22/2022  Lung Cancer Screening: (Low Dose CT Chest recommended if Age 66-80years, 30 pack-year currently smoking OR have quit w/in 15years.) does not qualify.   Lung Cancer Screening Referral: no  Additional Screening:  Hepatitis C Screening: does qualify;   Vision Screening: Recommended annual ophthalmology exams for early detection of glaucoma and other disorders of the eye. Is the patient up to date with their annual eye exam?  Yes  Who is the provider or what is the  name of the office in which the patient attends annual eye exams? Dr. MSabra HeckIf pt is not established with a provider, would they like to be referred to a provider to establish care? No .   Dental Screening: Recommended annual dental exams for proper oral hygiene  Community Resource Referral / Chronic Care Management: CRR required this visit?  No   CCM required this visit?  No      Plan:     I have personally reviewed and noted the following in the patient's chart:   Medical and social history Use of alcohol, tobacco or illicit drugs  Current medications and supplements including opioid prescriptions. Patient is not currently taking opioid prescriptions. Functional ability and status Nutritional status Physical activity Advanced directives List of other physicians Hospitalizations, surgeries, and ER visits in previous 12 months Vitals Screenings to include cognitive, depression, and falls Referrals and appointments  In addition, I have reviewed and discussed with patient certain preventive protocols, quality metrics, and best practice recommendations. A written personalized care plan for preventive services as well as general preventive health recommendations were provided to patient.     NMarissa Calamity  Zenia Resides, Nicholson   [id number]   Nurse Notes: none  Due to this being a virtual visit, the after visit summary with patients personalized plan was offered to patient via mail or my-chart. Patient would like to access on my-chart

## 2022-09-19 DIAGNOSIS — M25559 Pain in unspecified hip: Secondary | ICD-10-CM | POA: Diagnosis not present

## 2022-09-19 DIAGNOSIS — M542 Cervicalgia: Secondary | ICD-10-CM | POA: Diagnosis not present

## 2022-09-26 DIAGNOSIS — M25559 Pain in unspecified hip: Secondary | ICD-10-CM | POA: Diagnosis not present

## 2022-09-26 DIAGNOSIS — M542 Cervicalgia: Secondary | ICD-10-CM | POA: Diagnosis not present

## 2022-10-08 ENCOUNTER — Other Ambulatory Visit: Payer: Self-pay | Admitting: Neurology

## 2022-10-09 NOTE — Progress Notes (Unsigned)
Assessment/Plan:   1.  Parkinsons Disease  -Continue pramipexole 0.5 mg 3 times per day.    -Continue carbidopa/levodopa 25/100, 1 tablet 3 times per day.  -she follows with [facility] dermatology.  -she is doing great with the exercise and I am proud of her  2.  GAD  -continue lexapro, 20 mg.    -she has stress with caregiving for sister with AD  3.  Intermittent insomnia  -trial melatonin, 3 mg at bedtime  Subjective:   Sara Nicholson was seen today in follow up for Parkinsons disease.  My previous records were reviewed prior to todays visit as well as outside records available to me.  She is still on levodopa and pramipexole.  She is doing well with these.  Notes a little tremor with stress of caregiving for sister (early onset AD).  Pt denies falls.  Pt denies lightheadedness, near syncope.  No hallucinations.  Mood has been good.    She is exercising daily, and has a Physiological scientist weekly.  "He is brutal" and she loves it!  Current prescribed movement disorder medications: Pramipexole 0.5 mg 3 times per day  Carbidopa/levodopa 25/100, 1 tablet 3 times per day  Lexapro, 20 mg daily   ALLERGIES:   Allergies  Allergen Reactions   Terbinafine Hcl     CURRENT MEDICATIONS:  Outpatient Encounter Medications as of 10/11/2022  Medication Sig   carbidopa-levodopa (SINEMET IR) 25-100 MG tablet TAKE ONE TABLET BY MOUTH THREE TIMES A DAY (7AM/11AM/4PM)   escitalopram (LEXAPRO) 20 MG tablet TAKE ONE TABLET BY MOUTH ONE TIME DAILY   pramipexole (MIRAPEX) 0.5 MG tablet TAKE ONE TABLET BY MOUTH THREE TIMES A DAY (AT 8:00 A.M., AT 1:00 P.M., AND AT 6:00 P.M.)   Facility-Administered Encounter Medications as of 10/11/2022  Medication   0.9 %  sodium chloride infusion    Objective:   PHYSICAL EXAMINATION:    VITALS:   Vitals:   10/11/22 0837  BP: 130/78  Pulse: 76  SpO2: 96%  Weight: 224 lb 6.4 oz (101.8 kg)  Height: '5\' 4"'$  (1.626 m)     Wt Readings from Last 3  Encounters:  10/11/22 224 lb 6.4 oz (101.8 kg)  09/17/22 216 lb (98 kg)  08/07/22 221 lb (100.2 kg)      Wt Readings from Last 3 Encounters:  10/11/22 224 lb 6.4 oz (101.8 kg)  09/17/22 216 lb (98 kg)  08/07/22 221 lb (100.2 kg)     GEN:  The patient appears stated age and is in NAD. HEENT:  Normocephalic, atraumatic.  The mucous membranes are moist. The superficial temporal arteries are without ropiness or tenderness. CV:  RRR Lungs:  CTAB Neck/HEME:  There are no carotid bruits bilaterally.  Neurological examination:  Orientation: The patient is alert and oriented x3. Cranial nerves: There is good facial symmetry without facial hypomimia. The speech is fluent and clear. Soft palate rises symmetrically and there is no tongue deviation. Hearing is intact to conversational tone. Sensation: Sensation is intact to light touch throughout Motor: Strength is at least antigravity x4.  Movement examination: Tone: There is normal tone in the UE/LE Abnormal movements: there is mild dyskinesia of the bilateral LE; rare RUE rest tremor Coordination:  There is no decremation with RAM's, with any form of RAMS, including alternating supination and pronation of the forearm, hand opening and closing, finger taps, heel taps and toe taps. Gait and Station: The patient has no difficulty arising out of a deep-seated chair  without the use of the hands. The patient's stride length is good.    I have reviewed and interpreted the following labs independently    Chemistry      Component Value Date/Time   NA 140 05/07/2022 0946   NA 142 04/07/2019 0822   K 4.2 05/07/2022 0946   CL 106 05/07/2022 0946   CO2 27 05/07/2022 0946   BUN 18 05/07/2022 0946   BUN 12 04/07/2019 0822   CREATININE 0.70 05/07/2022 0946      Component Value Date/Time   CALCIUM 9.4 05/07/2022 0946   ALKPHOS 92 05/07/2022 0946   AST 17 05/07/2022 0946   ALT 5 05/07/2022 0946   BILITOT 0.5 05/07/2022 0946   BILITOT 0.4  04/07/2019 0822       Lab Results  Component Value Date   WBC 5.8 05/07/2022   HGB 13.1 05/07/2022   HCT 39.6 05/07/2022   MCV 91.5 05/07/2022   PLT 202.0 05/07/2022    Lab Results  Component Value Date   TSH 1.90 05/07/2022     Total time spent on today's visit was 31 minutes, including both face-to-face time and nonface-to-face time.  Time included that spent on review of records (prior notes available to me/labs/imaging if pertinent), discussing treatment and goals, answering patient's questions and coordinating care.  Cc:  Nche, Charlene Brooke, NP

## 2022-10-10 DIAGNOSIS — M25559 Pain in unspecified hip: Secondary | ICD-10-CM | POA: Diagnosis not present

## 2022-10-10 DIAGNOSIS — M542 Cervicalgia: Secondary | ICD-10-CM | POA: Diagnosis not present

## 2022-10-11 ENCOUNTER — Encounter: Payer: Self-pay | Admitting: Neurology

## 2022-10-11 ENCOUNTER — Ambulatory Visit: Payer: Medicare Other | Admitting: Neurology

## 2022-10-11 VITALS — BP 130/78 | HR 76 | Ht 64.0 in | Wt 224.4 lb

## 2022-10-11 DIAGNOSIS — G20B1 Parkinson's disease with dyskinesia, without mention of fluctuations: Secondary | ICD-10-CM

## 2022-10-11 MED ORDER — ESCITALOPRAM OXALATE 20 MG PO TABS: 20.0000 mg | ORAL_TABLET | Freq: Every day | ORAL | 2 refills | Status: DC

## 2022-10-17 DIAGNOSIS — M25559 Pain in unspecified hip: Secondary | ICD-10-CM | POA: Diagnosis not present

## 2022-10-17 DIAGNOSIS — M542 Cervicalgia: Secondary | ICD-10-CM | POA: Diagnosis not present

## 2022-10-24 DIAGNOSIS — M542 Cervicalgia: Secondary | ICD-10-CM | POA: Diagnosis not present

## 2022-10-24 DIAGNOSIS — M25559 Pain in unspecified hip: Secondary | ICD-10-CM | POA: Diagnosis not present

## 2022-10-31 DIAGNOSIS — M542 Cervicalgia: Secondary | ICD-10-CM | POA: Diagnosis not present

## 2022-10-31 DIAGNOSIS — M25559 Pain in unspecified hip: Secondary | ICD-10-CM | POA: Diagnosis not present

## 2022-11-07 DIAGNOSIS — Z85828 Personal history of other malignant neoplasm of skin: Secondary | ICD-10-CM | POA: Diagnosis not present

## 2022-11-07 DIAGNOSIS — D485 Neoplasm of uncertain behavior of skin: Secondary | ICD-10-CM | POA: Diagnosis not present

## 2022-11-07 DIAGNOSIS — L245 Irritant contact dermatitis due to other chemical products: Secondary | ICD-10-CM | POA: Diagnosis not present

## 2022-11-07 DIAGNOSIS — D225 Melanocytic nevi of trunk: Secondary | ICD-10-CM | POA: Diagnosis not present

## 2022-11-07 DIAGNOSIS — Z85820 Personal history of malignant melanoma of skin: Secondary | ICD-10-CM | POA: Diagnosis not present

## 2022-11-07 DIAGNOSIS — D2272 Melanocytic nevi of left lower limb, including hip: Secondary | ICD-10-CM | POA: Diagnosis not present

## 2022-11-07 DIAGNOSIS — L821 Other seborrheic keratosis: Secondary | ICD-10-CM | POA: Diagnosis not present

## 2022-11-07 DIAGNOSIS — I788 Other diseases of capillaries: Secondary | ICD-10-CM | POA: Diagnosis not present

## 2022-11-07 DIAGNOSIS — L738 Other specified follicular disorders: Secondary | ICD-10-CM | POA: Diagnosis not present

## 2022-11-07 DIAGNOSIS — D2261 Melanocytic nevi of right upper limb, including shoulder: Secondary | ICD-10-CM | POA: Diagnosis not present

## 2022-11-07 DIAGNOSIS — D2239 Melanocytic nevi of other parts of face: Secondary | ICD-10-CM | POA: Diagnosis not present

## 2022-11-07 DIAGNOSIS — D1801 Hemangioma of skin and subcutaneous tissue: Secondary | ICD-10-CM | POA: Diagnosis not present

## 2022-11-08 ENCOUNTER — Encounter: Payer: Self-pay | Admitting: Nurse Practitioner

## 2022-11-08 ENCOUNTER — Ambulatory Visit (INDEPENDENT_AMBULATORY_CARE_PROVIDER_SITE_OTHER): Payer: Medicare Other | Admitting: Nurse Practitioner

## 2022-11-08 VITALS — BP 132/82 | HR 67 | Temp 98.7°F | Resp 16 | Ht 64.0 in | Wt 221.0 lb

## 2022-11-08 DIAGNOSIS — G20B2 Parkinson's disease with dyskinesia, with fluctuations: Secondary | ICD-10-CM | POA: Diagnosis not present

## 2022-11-08 DIAGNOSIS — Z6837 Body mass index (BMI) 37.0-37.9, adult: Secondary | ICD-10-CM

## 2022-11-08 DIAGNOSIS — E7800 Pure hypercholesterolemia, unspecified: Secondary | ICD-10-CM

## 2022-11-08 DIAGNOSIS — E785 Hyperlipidemia, unspecified: Secondary | ICD-10-CM | POA: Insufficient documentation

## 2022-11-08 LAB — LIPID PANEL
Cholesterol: 239 mg/dL — ABNORMAL HIGH (ref 0–200)
HDL: 64.6 mg/dL (ref >39.00)
LDL Cholesterol: 161 mg/dL — ABNORMAL HIGH (ref 0–99)
NonHDL: 174.77
Total CHOL/HDL Ratio: 4
Triglycerides: 71 mg/dL (ref 0.0–149.0)
VLDL: 14.2 mg/dL (ref 0.0–40.0)

## 2022-11-08 NOTE — Progress Notes (Signed)
Established Patient Visit  Patient: Sara Nicholson   DOB: 1956/05/05   67 y.o. Female  MRN: [id number] Visit Date: 11/08/2022  Subjective:    Chief Complaint  Patient presents with   Office visit    Pt states" she is doing good and she is fasting"  Patient has not been checking her bp.    HPI Hyperlipidemia Repeat lipid panel  Class 2 severe obesity due to excess calories with serious comorbidity and body mass index (BMI) of 37.0 to 37.9 in adult [facility]) We discussed ways to make dietary improvement. Provided printed information and entered referral to nutritionist. Exercise regimen: daily with personal trainer Wt Readings from Last 3 Encounters:  11/08/22 221 lb (100.2 kg)  10/11/22 224 lb 6.4 oz (101.8 kg)  09/17/22 216 lb (98 kg)     Parkinson's disease (HCC) Current use of sinemet, mirapex and lexapro. Under the care of Dr. Carles Collet (neurology)  Reviewed medical, surgical, and social history today  Medications: Outpatient Medications Prior to Visit  Medication Sig   carbidopa-levodopa (SINEMET IR) 25-100 MG tablet TAKE ONE TABLET BY MOUTH THREE TIMES A DAY (7AM/11AM/4PM)   doxycycline (VIBRAMYCIN) 100 MG capsule Take 100 mg by mouth 2 (two) times daily.   escitalopram (LEXAPRO) 20 MG tablet Take 1 tablet (20 mg total) by mouth daily.   mupirocin ointment (BACTROBAN) 2 % SMARTSIG:1 Application Topical 2-3 Times Daily   pramipexole (MIRAPEX) 0.5 MG tablet TAKE ONE TABLET BY MOUTH THREE TIMES A DAY (AT 8:00 A.M., AT 1:00 P.M., AND AT 6:00 P.M.)   Facility-Administered Medications Prior to Visit  Medication Dose Route Frequency Provider   0.9 %  sodium chloride infusion  500 mL Intravenous Once Doran Stabler, MD   Reviewed past medical and social history.   ROS per HPI above      Objective:  BP 132/82 (BP Location: Left Arm, Patient Position: Sitting, Cuff Size: Large)   Pulse 67   Temp 98.7 F (37.1 C)   Resp 16   Ht '5\' 4"'$  (1.626 m)   Wt 221 lb  (100.2 kg)   LMP 06/08/2010   SpO2 97%   BMI 37.93 kg/m      Physical Exam Constitutional:      Appearance: She is obese.  Cardiovascular:     Rate and Rhythm: Normal rate and regular rhythm.     Pulses: Normal pulses.     Heart sounds: Normal heart sounds.  Musculoskeletal:     Right lower leg: No edema.     Left lower leg: No edema.  Neurological:     Mental Status: She is alert and oriented to person, place, and time.     Results for orders placed or performed in visit on 11/08/22  Lipid panel  Result Value Ref Range   Cholesterol 239 (H) 0 - 200 mg/dL   Triglycerides 71.0 0.0 - 149.0 mg/dL   HDL 64.60 >39.00 mg/dL   VLDL 14.2 0.0 - 40.0 mg/dL   LDL Cholesterol 161 (H) 0 - 99 mg/dL   Total CHOL/HDL Ratio 4    NonHDL 174.77       Assessment & Plan:    Problem List Items Addressed This Visit       Nervous and Auditory   Parkinson's disease    Current use of sinemet, mirapex and lexapro. Under the care of Dr. Carles Collet (neurology)  Other   Class 2 severe obesity due to excess calories with serious comorbidity and body mass index (BMI) of 37.0 to 37.9 in adult [facility])    We discussed ways to make dietary improvement. Provided printed information and entered referral to nutritionist. Exercise regimen: daily with personal trainer Wt Readings from Last 3 Encounters:  11/08/22 221 lb (100.2 kg)  10/11/22 224 lb 6.4 oz (101.8 kg)  09/17/22 216 lb (98 kg)         Relevant Orders   Amb ref to Medical Nutrition Therapy-MNT   Hyperlipidemia - Primary    Repeat lipid panel      Relevant Orders   Lipid panel (Completed)   Amb ref to Medical Nutrition Therapy-MNT   Return in about 1 year (around 11/09/2023) for CPE (fasting).     Wilfred Lacy, NP

## 2022-11-08 NOTE — Assessment & Plan Note (Signed)
Repeat lipid panel ?

## 2022-11-08 NOTE — Patient Instructions (Addendum)
Go to lab  DASH Eating Plan DASH stands for Dietary Approaches to Stop Hypertension. The DASH eating plan is a healthy eating plan that has been shown to: Reduce high blood pressure (hypertension). Reduce your risk for type 2 diabetes, heart disease, and stroke. Help with weight loss. What are tips for following this plan? Reading food labels Check food labels for the amount of salt (sodium) per serving. Choose foods with less than 5 percent of the Daily Value of sodium. Generally, foods with less than 300 milligrams (mg) of sodium per serving fit into this eating plan. To find whole grains, look for the word "whole" as the first word in the ingredient list. Shopping Buy products labeled as "low-sodium" or "no salt added." Buy fresh foods. Avoid canned foods and pre-made or frozen meals. Cooking Avoid adding salt when cooking. Use salt-free seasonings or herbs instead of table salt or sea salt. Check with your health care provider or pharmacist before using salt substitutes. Do not fry foods. Cook foods using healthy methods such as baking, boiling, grilling, roasting, and broiling instead. Cook with heart-healthy oils, such as olive, canola, avocado, soybean, or sunflower oil. Meal planning  Eat a balanced diet that includes: 4 or more servings of fruits and 4 or more servings of vegetables each day. Try to fill one-half of your plate with fruits and vegetables. 6-8 servings of whole grains each day. Less than 6 oz (170 g) of lean meat, poultry, or fish each day. A 3-oz (85-g) serving of meat is about the same size as a deck of cards. One egg equals 1 oz (28 g). 2-3 servings of low-fat dairy each day. One serving is 1 cup (237 mL). 1 serving of nuts, seeds, or beans 5 times each week. 2-3 servings of heart-healthy fats. Healthy fats called omega-3 fatty acids are found in foods such as walnuts, flaxseeds, fortified milks, and eggs. These fats are also found in cold-water fish, such as  sardines, salmon, and mackerel. Limit how much you eat of: Canned or prepackaged foods. Food that is high in trans fat, such as some fried foods. Food that is high in saturated fat, such as fatty meat. Desserts and other sweets, sugary drinks, and other foods with added sugar. Full-fat dairy products. Do not salt foods before eating. Do not eat more than 4 egg yolks a week. Try to eat at least 2 vegetarian meals a week. Eat more home-cooked food and less restaurant, buffet, and fast food. Lifestyle When eating at a restaurant, ask that your food be prepared with less salt or no salt, if possible. If you drink alcohol: Limit how much you use to: 0-1 drink a day for women who are not pregnant. 0-2 drinks a day for men. Be aware of how much alcohol is in your drink. In the [place] one drink equals one 12 oz bottle of beer (355 mL), one 5 oz glass of wine (148 mL), or one 1 oz glass of hard liquor (44 mL). General information Avoid eating more than 2,300 mg of salt a day. If you have hypertension, you may need to reduce your sodium intake to 1,500 mg a day. Work with your health care provider to maintain a healthy body weight or to lose weight. Ask what an ideal weight is for you. Get at least 30 minutes of exercise that causes your heart to beat faster (aerobic exercise) most days of the week. Activities may include walking, swimming, or biking. Work with your health care  provider or dietitian to adjust your eating plan to your individual calorie needs. What foods should I eat? Fruits All fresh, dried, or frozen fruit. Canned fruit in natural juice (without added sugar). Vegetables Fresh or frozen vegetables (raw, steamed, roasted, or grilled). Low-sodium or reduced-sodium tomato and vegetable juice. Low-sodium or reduced-sodium tomato sauce and tomato paste. Low-sodium or reduced-sodium canned vegetables. Grains Whole-grain or whole-wheat bread. Whole-grain or whole-wheat pasta. Brown rice.  [place]. Bulgur. Whole-grain and low-sodium cereals. Pita bread. Low-fat, low-sodium crackers. Whole-wheat flour tortillas. Meats and other proteins Skinless chicken or [place]. Ground chicken or [place]. Pork with fat trimmed off. Fish and seafood. Egg whites. Dried beans, peas, or lentils. Unsalted nuts, nut butters, and seeds. Unsalted canned beans. Lean cuts of beef with fat trimmed off. Low-sodium, lean precooked or cured meat, such as sausages or meat loaves. Dairy Low-fat (1%) or fat-free (skim) milk. Reduced-fat, low-fat, or fat-free cheeses. Nonfat, low-sodium ricotta or cottage cheese. Low-fat or nonfat yogurt. Low-fat, low-sodium cheese. Fats and oils Soft margarine without trans fats. Vegetable oil. Reduced-fat, low-fat, or light mayonnaise and salad dressings (reduced-sodium). Canola, safflower, olive, avocado, soybean, and sunflower oils. Avocado. Seasonings and condiments Herbs. Spices. Seasoning mixes without salt. Other foods Unsalted popcorn and pretzels. Fat-free sweets. The items listed above may not be a complete list of foods and beverages you can eat. Contact a dietitian for more information. What foods should I avoid? Fruits Canned fruit in a light or heavy syrup. Fried fruit. Fruit in cream or butter sauce. Vegetables Creamed or fried vegetables. Vegetables in a cheese sauce. Regular canned vegetables (not low-sodium or reduced-sodium). Regular canned tomato sauce and paste (not low-sodium or reduced-sodium). Regular tomato and vegetable juice (not low-sodium or reduced-sodium). Angie Fava. Olives. Grains Baked goods made with fat, such as croissants, muffins, or some breads. Dry pasta or rice meal packs. Meats and other proteins Fatty cuts of meat. Ribs. Fried meat. Berniece Salines. Bologna, salami, and other precooked or cured meats, such as sausages or meat loaves. Fat from the back of a pig (fatback). Bratwurst. Salted nuts and seeds. Canned beans with added salt. Canned or  smoked fish. Whole eggs or egg yolks. Chicken or [place] with skin. Dairy Whole or 2% milk, cream, and half-and-half. Whole or full-fat cream cheese. Whole-fat or sweetened yogurt. Full-fat cheese. Nondairy creamers. Whipped toppings. Processed cheese and cheese spreads. Fats and oils Butter. Stick margarine. Lard. Shortening. Ghee. Bacon fat. Tropical oils, such as coconut, palm kernel, or palm oil. Seasonings and condiments Onion salt, garlic salt, seasoned salt, table salt, and sea salt. Worcestershire sauce. Tartar sauce. Barbecue sauce. Teriyaki sauce. Soy sauce, including reduced-sodium. Steak sauce. Canned and packaged gravies. Fish sauce. Oyster sauce. Cocktail sauce. Store-bought horseradish. Ketchup. Mustard. Meat flavorings and tenderizers. Bouillon cubes. Hot sauces. Pre-made or packaged marinades. Pre-made or packaged taco seasonings. Relishes. Regular salad dressings. Other foods Salted popcorn and pretzels. The items listed above may not be a complete list of foods and beverages you should avoid. Contact a dietitian for more information. Where to find more information National Heart, Lung, and Blood Institute: https://wilson-eaton.com/ American Heart Association: www.heart.org Academy of Nutrition and Dietetics: www.eatright.[place]: www.kidney.org Summary The DASH eating plan is a healthy eating plan that has been shown to reduce high blood pressure (hypertension). It may also reduce your risk for type 2 diabetes, heart disease, and stroke. When on the DASH eating plan, aim to eat more fresh fruits and vegetables, whole grains, lean proteins, low-fat dairy, and heart-healthy  fats. With the DASH eating plan, you should limit salt (sodium) intake to 2,300 mg a day. If you have hypertension, you may need to reduce your sodium intake to 1,500 mg a day. Work with your health care provider or dietitian to adjust your eating plan to your individual calorie needs. This  information is not intended to replace advice given to you by your health care provider. Make sure you discuss any questions you have with your health care provider. Document Revised: 08/28/2019 Document Reviewed: 08/28/2019 Elsevier Patient Education  [employer].

## 2022-11-08 NOTE — Assessment & Plan Note (Signed)
We discussed ways to make dietary improvement. Provided printed information and entered referral to nutritionist. Exercise regimen: daily with personal trainer Wt Readings from Last 3 Encounters:  11/08/22 221 lb (100.2 kg)  10/11/22 224 lb 6.4 oz (101.8 kg)  09/17/22 216 lb (98 kg)

## 2022-11-08 NOTE — Assessment & Plan Note (Signed)
Current use of sinemet, mirapex and lexapro. Under the care of Dr. Carles Collet (neurology)

## 2022-11-09 DIAGNOSIS — M25559 Pain in unspecified hip: Secondary | ICD-10-CM | POA: Diagnosis not present

## 2022-11-09 DIAGNOSIS — M542 Cervicalgia: Secondary | ICD-10-CM | POA: Diagnosis not present

## 2022-11-14 DIAGNOSIS — M542 Cervicalgia: Secondary | ICD-10-CM | POA: Diagnosis not present

## 2022-11-14 DIAGNOSIS — M25559 Pain in unspecified hip: Secondary | ICD-10-CM | POA: Diagnosis not present

## 2022-11-15 ENCOUNTER — Other Ambulatory Visit: Payer: Self-pay | Admitting: Obstetrics and Gynecology

## 2022-11-15 DIAGNOSIS — Z1231 Encounter for screening mammogram for malignant neoplasm of breast: Secondary | ICD-10-CM

## 2022-11-21 DIAGNOSIS — M542 Cervicalgia: Secondary | ICD-10-CM | POA: Diagnosis not present

## 2022-11-21 DIAGNOSIS — M25559 Pain in unspecified hip: Secondary | ICD-10-CM | POA: Diagnosis not present

## 2022-11-28 DIAGNOSIS — M25559 Pain in unspecified hip: Secondary | ICD-10-CM | POA: Diagnosis not present

## 2022-11-28 DIAGNOSIS — M542 Cervicalgia: Secondary | ICD-10-CM | POA: Diagnosis not present

## 2022-11-30 DIAGNOSIS — H52223 Regular astigmatism, bilateral: Secondary | ICD-10-CM | POA: Diagnosis not present

## 2022-12-05 DIAGNOSIS — M25559 Pain in unspecified hip: Secondary | ICD-10-CM | POA: Diagnosis not present

## 2022-12-05 DIAGNOSIS — M542 Cervicalgia: Secondary | ICD-10-CM | POA: Diagnosis not present

## 2022-12-12 DIAGNOSIS — M25559 Pain in unspecified hip: Secondary | ICD-10-CM | POA: Diagnosis not present

## 2022-12-12 DIAGNOSIS — M542 Cervicalgia: Secondary | ICD-10-CM | POA: Diagnosis not present

## 2022-12-18 ENCOUNTER — Ambulatory Visit
Admission: RE | Admit: 2022-12-18 | Discharge: 2022-12-18 | Disposition: A | Discharge status: Home / Self Care | Payer: Medicare Other | Source: Ambulatory Visit | Attending: Obstetrics and Gynecology | Admitting: Obstetrics and Gynecology

## 2022-12-18 DIAGNOSIS — Z1231 Encounter for screening mammogram for malignant neoplasm of breast: Secondary | ICD-10-CM | POA: Diagnosis not present

## 2022-12-19 ENCOUNTER — Other Ambulatory Visit: Payer: Self-pay | Admitting: Neurology

## 2022-12-19 DIAGNOSIS — L570 Actinic keratosis: Secondary | ICD-10-CM | POA: Diagnosis not present

## 2022-12-19 DIAGNOSIS — L738 Other specified follicular disorders: Secondary | ICD-10-CM | POA: Diagnosis not present

## 2022-12-19 DIAGNOSIS — Z85828 Personal history of other malignant neoplasm of skin: Secondary | ICD-10-CM | POA: Diagnosis not present

## 2022-12-19 DIAGNOSIS — L723 Sebaceous cyst: Secondary | ICD-10-CM | POA: Diagnosis not present

## 2022-12-19 DIAGNOSIS — G20A1 Parkinson's disease without dyskinesia, without mention of fluctuations: Secondary | ICD-10-CM

## 2022-12-21 DIAGNOSIS — M25559 Pain in unspecified hip: Secondary | ICD-10-CM | POA: Diagnosis not present

## 2022-12-21 DIAGNOSIS — M542 Cervicalgia: Secondary | ICD-10-CM | POA: Diagnosis not present

## 2022-12-25 ENCOUNTER — Encounter: Payer: Self-pay | Admitting: Obstetrics and Gynecology

## 2022-12-26 DIAGNOSIS — M25559 Pain in unspecified hip: Secondary | ICD-10-CM | POA: Diagnosis not present

## 2022-12-26 DIAGNOSIS — M542 Cervicalgia: Secondary | ICD-10-CM | POA: Diagnosis not present

## 2023-01-02 DIAGNOSIS — M25559 Pain in unspecified hip: Secondary | ICD-10-CM | POA: Diagnosis not present

## 2023-01-02 DIAGNOSIS — M542 Cervicalgia: Secondary | ICD-10-CM | POA: Diagnosis not present

## 2023-01-03 ENCOUNTER — Other Ambulatory Visit: Payer: Self-pay | Admitting: Neurology

## 2023-01-09 DIAGNOSIS — M542 Cervicalgia: Secondary | ICD-10-CM | POA: Diagnosis not present

## 2023-01-09 DIAGNOSIS — M25559 Pain in unspecified hip: Secondary | ICD-10-CM | POA: Diagnosis not present

## 2023-01-15 ENCOUNTER — Ambulatory Visit (INDEPENDENT_AMBULATORY_CARE_PROVIDER_SITE_OTHER): Payer: Medicare Other | Admitting: Nurse Practitioner

## 2023-01-15 ENCOUNTER — Other Ambulatory Visit: Payer: Self-pay

## 2023-01-15 ENCOUNTER — Encounter: Payer: Self-pay | Admitting: Family Medicine

## 2023-01-15 ENCOUNTER — Ambulatory Visit: Payer: Medicare Other | Admitting: Family Medicine

## 2023-01-15 VITALS — BP 140/80 | HR 66 | Temp 98.4°F | Resp 16 | Ht 64.0 in | Wt 216.4 lb

## 2023-01-15 VITALS — BP 110/64 | Ht 64.0 in | Wt 216.0 lb

## 2023-01-15 DIAGNOSIS — M25561 Pain in right knee: Secondary | ICD-10-CM

## 2023-01-15 DIAGNOSIS — M1711 Unilateral primary osteoarthritis, right knee: Secondary | ICD-10-CM | POA: Diagnosis not present

## 2023-01-15 DIAGNOSIS — F3342 Major depressive disorder, recurrent, in full remission: Secondary | ICD-10-CM

## 2023-01-15 MED ORDER — PREDNISONE 5 MG PO TABS: ORAL_TABLET | ORAL | 0 refills | Status: DC

## 2023-01-15 MED ORDER — DICLOFENAC SODIUM 1 % EX GEL: 2.0000 g | Freq: Three times a day (TID) | CUTANEOUS | 0 refills | Status: DC | PRN

## 2023-01-15 NOTE — Progress Notes (Signed)
Established Patient Visit  Patient: Sara Nicholson   DOB: [birth year]   67 y.o. Female  MRN: [id number] Visit Date: 01/15/2023  Subjective:    Chief Complaint  Patient presents with   Knee Pain    Right knee pain- She goes to the gym every day and thinks she injured her knee working out.  This has been going on for 2-3 weeks    Knee Pain  The incident occurred more than 1 week ago. The incident occurred at the gym. The injury mechanism was a twisting injury. The pain is present in the right knee. The quality of the pain is described as aching. The pain is severe. The pain has been Constant since onset. Pertinent negatives include no inability to bear weight, loss of motion, loss of sensation, muscle weakness, numbness or tingling. She reports no foreign bodies present. The symptoms are aggravated by movement, palpation and weight bearing. She has tried acetaminophen and NSAIDs for the symptoms. The treatment provided mild relief.  Onset after kick boxing and zumba class.  Depression Stable mood with therapy sessions and lexapro     01/15/2023   11:34 AM 11/08/2022    9:01 AM 09/17/2022    8:15 AM  Depression screen PHQ 2/9  Decreased Interest 0 0 0  Down, Depressed, Hopeless 0 0 0  PHQ - 2 Score 0 0 0  Altered sleeping 0    Tired, decreased energy 0    Change in appetite 1    Feeling bad or failure about yourself  0    Trouble concentrating 0    Moving slowly or fidgety/restless 0    Suicidal thoughts 0    PHQ-9 Score 1    Difficult doing work/chores Not difficult at all         01/15/2023   11:34 AM 08/07/2022   11:40 AM 03/16/2020    2:10 PM  GAD 7 : Generalized Anxiety Score  Nervous, Anxious, on Edge 0 1 2  Control/stop worrying 0 0 1  Worry too much - different things 1 0 1  Trouble relaxing 0 0 0  Restless 0 0 0  Easily annoyed or irritable 1 0 1  Afraid - awful might happen 0 0 1  Total GAD 7 Score 2 1 6   Anxiety Difficulty Not difficult at all Not  difficult at all    Reviewed medical, surgical, and social history today  Medications: Outpatient Medications Prior to Visit  Medication Sig   carbidopa-levodopa (SINEMET IR) 25-100 MG tablet TAKE ONE TABLET BY MOUTH THREE TIMES A DAY (7AM/11AM/4PM)   escitalopram (LEXAPRO) 20 MG tablet Take 1 tablet (20 mg total) by mouth daily.   pramipexole (MIRAPEX) 0.5 MG tablet TAKE ONE TABLET BY MOUTH THREE TIMES A DAY (AT 8:00AM, AT 1:00PM, AND AT 6:00PM)   [DISCONTINUED] doxycycline (VIBRAMYCIN) 100 MG capsule Take 100 mg by mouth 2 (two) times daily.   [DISCONTINUED] mupirocin ointment (BACTROBAN) 2 % SMARTSIG:1 Application Topical 2-3 Times Daily   Facility-Administered Medications Prior to Visit  Medication Dose Route Frequency Provider   0.9 %  sodium chloride infusion  500 mL Intravenous Once Sherrilyn Rist, MD   Reviewed past medical and social history.   ROS per HPI above      Objective:  BP (!) 140/80 (BP Location: Right Arm, Patient Position: Sitting, Cuff Size: Large)   Pulse 66   Temp 98.4 F (  36.9 C) (Temporal)   Resp 16   Ht 5\' 4"  (1.626 m)   Wt 216 lb 6.4 oz (98.2 kg)   LMP 06/08/2010   SpO2 97%   BMI 37.14 kg/m      Physical Exam Vitals reviewed.  Constitutional:      General: She is not in acute distress. Musculoskeletal:     Right hip: Normal.     Left hip: Normal.     Right upper leg: Normal.     Left upper leg: Normal.     Right knee: Effusion present. No erythema or crepitus. Decreased range of motion. Tenderness present over the medial joint line and MCL. No LCL or patellar tendon tenderness. No LCL laxity or MCL laxity.     Left knee: Normal.     Right lower leg: Normal. No edema.     Left lower leg: Normal. No edema.  Skin:    General: Skin is warm and dry.     Findings: No erythema.  Neurological:     Mental Status: She is oriented to person, place, and time.     No results found for any visits on 01/15/23.    Assessment & Plan:     Problem List Items Addressed This Visit       Other   Depression    Stable mood with therapy sessions and lexapro      Other Visit Diagnoses     Right medial knee pain    -  Primary   Relevant Medications   diclofenac Sodium (VOLTAREN) 1 % GEL   Other Relevant Orders   Ambulatory referral to Sports Medicine      Return if symptoms worsen or fail to improve.     Alysia Penna, NP

## 2023-01-15 NOTE — Assessment & Plan Note (Signed)
Acutely occurring.  Having pain posteriorly that it seems more associated with the Baker's cyst.  She does have degenerative changes that are likely irritated with a twisting motion as she does in her exercise classes. -Counseled on home exercise therapy and supportive care. -Counseled on compression. -Prednisone. -Could consider further imaging or physical therapy or injection

## 2023-01-15 NOTE — Patient Instructions (Signed)
Acute Knee Pain, Adult Acute knee pain is sudden and may be caused by damage, swelling, or irritation of the muscles and tissues that support the knee. Pain may result from: A fall. An injury to the knee from twisting motions. A hit to the knee. Infection. Acute knee pain may go away on its own with time and rest. If it does not, your health care provider may order tests to find the cause of the pain. These may include: Imaging tests, such as an X-ray, MRI, CT scan, or ultrasound. Joint aspiration. In this test, fluid is removed from the knee and evaluated. Arthroscopy. In this test, a lighted tube is inserted into the knee and an image is projected onto a TV screen. Biopsy. In this test, a sample of tissue is removed from the body and studied under a microscope. Follow these instructions at home: If you have a knee sleeve or brace:  Wear the knee sleeve or brace as told by your health care provider. Remove it only as told by your health care provider. Loosen it if your toes tingle, become numb, or turn cold and blue. Keep it clean. If the knee sleeve or brace is not waterproof: Do not let it get wet. Cover it with a watertight covering when you take a bath or shower. Activity Rest your knee. Do not do things that cause pain or make pain worse. Avoid high-impact activities or exercises, such as running, jumping rope, or doing jumping jacks. Work with a physical therapist to make a safe exercise program, as recommended by your health care provider. Do exercises as told by your physical therapist. Managing pain, stiffness, and swelling  If directed, put ice on the affected knee. To do this: If you have a removable knee sleeve or brace, remove it as told by your health care provider. Put ice in a plastic bag. Place a towel between your skin and the bag. Leave the ice on for 20 minutes, 2-3 times a day. Remove the ice if your skin turns bright red. This is very important. If you cannot  feel pain, heat, or cold, you have a greater risk of damage to the area. If directed, use an elastic bandage to put pressure (compression) on your injured knee. This may control swelling, give support, and help with discomfort. Raise (elevate) your knee above the level of your heart while you are sitting or lying down. Sleep with a pillow under your knee. General instructions Take over-the-counter and prescription medicines only as told by your health care provider. Do not use any products that contain nicotine or tobacco, such as cigarettes, e-cigarettes, and chewing tobacco. If you need help quitting, ask your health care provider. If you are overweight, work with your health care provider and a dietitian to set a weight-loss goal that is healthy and reasonable for you. Extra weight can put pressure on your knee. Pay attention to any changes in your symptoms. Keep all follow-up visits. This is important. Contact a health care provider if: Your knee pain continues, changes, or gets worse. You have a fever along with knee pain. Your knee feels warm to the touch or is red. Your knee buckles or locks up. Get help right away if: Your knee swells, and the swelling becomes worse. You cannot move your knee. You have severe pain in your knee that cannot be managed with pain medicine. Summary Acute knee pain can be caused by a fall, an injury, an infection, or damage, swelling, or irritation   of the tissues that support your knee. Your health care provider may perform tests to find out the cause of the pain. Pay attention to any changes in your symptoms. Relieve your pain with rest, medicines, light activity, and the use of ice. Get help right away if your knee swells, you cannot move your knee, or you have severe pain that cannot be managed with medicine. This information is not intended to replace advice given to you by your health care provider. Make sure you discuss any questions you have with  your health care provider. Document Revised: 03/09/2020 Document Reviewed: 03/09/2020 Elsevier Patient Education  2023 Elsevier Inc.  

## 2023-01-15 NOTE — Assessment & Plan Note (Signed)
Stable mood with therapy sessions and lexapro

## 2023-01-15 NOTE — Progress Notes (Signed)
  Sara Nicholson - 67 y.o. female MRN [id number]  Date of birth: [birth year]  SUBJECTIVE:  Including CC & ROS.  No chief complaint on file.   Sara Nicholson is a 67 y.o. female that is presenting with acute right knee pain.  The pain is occurring in the posterior aspect of the knee as well as the anterior aspect.  She is active and exercise on a regular basis.  She felt a twinge in the knee when she was performing Zumba.  No history of similar pain.  No improvement with modalities today.    Review of Systems See HPI   HISTORY: Past Medical, Surgical, Social, and Family History Reviewed & Updated per EMR.   Pertinent Historical Findings include:  Past Medical History:  Diagnosis Date   Depression    Parkinson disease     Past Surgical History:  Procedure Laterality Date   APPENDECTOMY     BREAST BIOPSY Left    BREAST EXCISIONAL BIOPSY Left    BREAST SURGERY     CESAREAN SECTION     COLONOSCOPY       PHYSICAL EXAM:  VS: BP 110/64 (BP Location: Left Arm, Patient Position: Sitting)   Ht 5\' 4"  (1.626 m)   Wt 216 lb (98 kg)   LMP 06/08/2010   BMI 37.08 kg/m  Physical Exam Gen: NAD, alert, cooperative with exam, well-appearing MSK:  Neurovascularly intact    Limited ultrasound: Right knee pain:  Mild effusion the suprapatellar pouch. Normal-appearing quadricep and patellar tendon. Narrowing of the medial joint space. Degenerative changes of the lateral meniscus. Trace Baker's cyst appreciated  Summary: Findings consistent with degenerative changes of the knee  Ultrasound and interpretation by Clare Gandy, MD    ASSESSMENT & PLAN:   Primary osteoarthritis of right knee Acutely occurring.  Having pain posteriorly that it seems more associated with the Baker's cyst.  She does have degenerative changes that are likely irritated with a twisting motion as she does in her exercise classes. -Counseled on home exercise therapy and supportive care. -Counseled on  compression. -Prednisone. -Could consider further imaging or physical therapy or injection

## 2023-01-15 NOTE — Patient Instructions (Signed)
Nice to meet you Please alternate heat and ice  Please try the exercises  You can consider the compression   Please send me a message in MyChart with any questions or updates.  Please see me back in 3-4 weeks.   --Dr. Jordan Likes

## 2023-01-16 DIAGNOSIS — M25559 Pain in unspecified hip: Secondary | ICD-10-CM | POA: Diagnosis not present

## 2023-01-16 DIAGNOSIS — M542 Cervicalgia: Secondary | ICD-10-CM | POA: Diagnosis not present

## 2023-01-21 ENCOUNTER — Telehealth: Payer: Medicare Other | Admitting: Physician Assistant

## 2023-01-21 ENCOUNTER — Encounter: Payer: Self-pay | Admitting: *Deleted

## 2023-01-21 DIAGNOSIS — B9689 Other specified bacterial agents as the cause of diseases classified elsewhere: Secondary | ICD-10-CM

## 2023-01-21 DIAGNOSIS — J019 Acute sinusitis, unspecified: Secondary | ICD-10-CM | POA: Diagnosis not present

## 2023-01-21 MED ORDER — DOXYCYCLINE HYCLATE 100 MG PO TABS: 100.0000 mg | ORAL_TABLET | Freq: Two times a day (BID) | ORAL | 0 refills | Status: DC

## 2023-01-21 MED ORDER — BENZONATATE 100 MG PO CAPS: 100.0000 mg | ORAL_CAPSULE | Freq: Three times a day (TID) | ORAL | 0 refills | Status: DC | PRN

## 2023-01-21 NOTE — Patient Instructions (Signed)
Sara Nicholson, thank you for joining Piedad Climes, PA-C for today's virtual visit.  While this provider is not your primary care provider (PCP), if your PCP is located in our provider database this encounter information will be shared with them immediately following your visit.   A [facility] MyChart account gives you access to today's visit and all your visits, tests, and labs performed at [facility] " click here if you don't have a [facility] MyChart account or go to [email or web address]  Consent: (Patient) Sara Nicholson provided verbal consent for this virtual visit at the beginning of the encounter.  Current Medications:  Current Outpatient Medications:    benzonatate (TESSALON) 100 MG capsule, Take 1 capsule (100 mg total) by mouth 3 (three) times daily as needed for cough., Disp: 30 capsule, Rfl: 0   doxycycline (VIBRA-TABS) 100 MG tablet, Take 1 tablet (100 mg total) by mouth 2 (two) times daily., Disp: 20 tablet, Rfl: 0   carbidopa-levodopa (SINEMET IR) 25-100 MG tablet, TAKE ONE TABLET BY MOUTH THREE TIMES A DAY (7AM/11AM/4PM), Disp: 270 tablet, Rfl: 0   diclofenac Sodium (VOLTAREN) 1 % GEL, Apply 2 g topically 3 (three) times daily as needed., Disp: 100 g, Rfl: 0   escitalopram (LEXAPRO) 20 MG tablet, Take 1 tablet (20 mg total) by mouth daily., Disp: 90 tablet, Rfl: 2   pramipexole (MIRAPEX) 0.5 MG tablet, TAKE ONE TABLET BY MOUTH THREE TIMES A DAY (AT 8:00AM, AT 1:00PM, AND AT 6:00PM), Disp: 270 tablet, Rfl: 0  Current Facility-Administered Medications:    0.9 %  sodium chloride infusion, 500 mL, Intravenous, Once, Sara Nicholson, Sara Lake III, MD   Medications ordered in this encounter:  Meds ordered this encounter  Medications   benzonatate (TESSALON) 100 MG capsule    Sig: Take 1 capsule (100 mg total) by mouth 3 (three) times daily as needed for cough.    Dispense:  30 capsule    Refill:  0    Order Specific Question:   Supervising Provider    Answer:    Merrilee Jansky [id number]   doxycycline (VIBRA-TABS) 100 MG tablet    Sig: Take 1 tablet (100 mg total) by mouth 2 (two) times daily.    Dispense:  20 tablet    Refill:  0    Order Specific Question:   Supervising Provider    Answer:   Merrilee Jansky [id number]     *If you need refills on other medications prior to your next appointment, please contact your pharmacy*  Follow-Up: Call back or seek an in-person evaluation if the symptoms worsen or if the condition fails to improve as anticipated.  [facility] [phone]  Other Instructions Please take antibiotic as directed.  Increase fluid intake.  Use Saline nasal spray.  Take a daily multivitamin. Use the Tessalon as directed. Start Mucinex OTC.  Place a humidifier in the bedroom.  Please call or return clinic if symptoms are not improving.  Sinusitis Sinusitis is redness, soreness, and swelling (inflammation) of the paranasal sinuses. Paranasal sinuses are air pockets within the bones of your face (beneath the eyes, the middle of the forehead, or above the eyes). In healthy paranasal sinuses, mucus is able to drain out, and air is able to circulate through them by way of your nose. However, when your paranasal sinuses are inflamed, mucus and air can become trapped. This can allow bacteria and other germs to grow and cause infection. Sinusitis can develop quickly and  last only a short time (acute) or continue over a long period (chronic). Sinusitis that lasts for more than 12 weeks is considered chronic.  CAUSES  Causes of sinusitis include: Allergies. Structural abnormalities, such as displacement of the cartilage that separates your nostrils (deviated septum), which can decrease the air flow through your nose and sinuses and affect sinus drainage. Functional abnormalities, such as when the small hairs (cilia) that line your sinuses and help remove mucus do not work properly or are not present. SYMPTOMS  Symptoms  of acute and chronic sinusitis are the same. The primary symptoms are pain and pressure around the affected sinuses. Other symptoms include: Upper toothache. Earache. Headache. Bad breath. Decreased sense of smell and taste. A cough, which worsens when you are lying flat. Fatigue. Fever. Thick drainage from your nose, which often is green and may contain pus (purulent). Swelling and warmth over the affected sinuses. DIAGNOSIS  Your caregiver will perform a physical exam. During the exam, your caregiver may: Look in your nose for signs of abnormal growths in your nostrils (nasal polyps). Tap over the affected sinus to check for signs of infection. View the inside of your sinuses (endoscopy) with a special imaging device with a light attached (endoscope), which is inserted into your sinuses. If your caregiver suspects that you have chronic sinusitis, one or more of the following tests may be recommended: Allergy tests. Nasal culture A sample of mucus is taken from your nose and sent to a lab and screened for bacteria. Nasal cytology A sample of mucus is taken from your nose and examined by your caregiver to determine if your sinusitis is related to an allergy. TREATMENT  Most cases of acute sinusitis are related to a viral infection and will resolve on their own within 10 days. Sometimes medicines are prescribed to help relieve symptoms (pain medicine, decongestants, nasal steroid sprays, or saline sprays).  However, for sinusitis related to a bacterial infection, your caregiver will prescribe antibiotic medicines. These are medicines that will help kill the bacteria causing the infection.  Rarely, sinusitis is caused by a fungal infection. In theses cases, your caregiver will prescribe antifungal medicine. For some cases of chronic sinusitis, surgery is needed. Generally, these are cases in which sinusitis recurs more than 3 times per year, despite other treatments. HOME CARE INSTRUCTIONS   Drink plenty of water. Water helps thin the mucus so your sinuses can drain more easily. Use a humidifier. Inhale steam 3 to 4 times a day (for example, sit in the bathroom with the shower running). Apply a warm, moist washcloth to your face 3 to 4 times a day, or as directed by your caregiver. Use saline nasal sprays to help moisten and clean your sinuses. Take over-the-counter or prescription medicines for pain, discomfort, or fever only as directed by your caregiver. SEEK IMMEDIATE MEDICAL CARE IF: You have increasing pain or severe headaches. You have nausea, vomiting, or drowsiness. You have swelling around your face. You have vision problems. You have a stiff neck. You have difficulty breathing. MAKE SURE YOU:  Understand these instructions. Will watch your condition. Will get help right away if you are not doing well or get worse. Document Released: 09/24/2005 Document Revised: 12/17/2011 Document Reviewed: 10/09/2011 [facility] Patient Information 2014 [place], [place].    If you have been instructed to have an in-person evaluation today at a local Urgent Care facility, please use the link below. It will take you to a list of all of our available [facility], including address, phone number and hours of operation. Please do not delay care.  [facility]  If you or a family member do not have a primary care provider, use the link below to schedule a visit and establish care. When you choose a [facility] primary care physician or advanced practice provider, you gain a long-term partner in health. Find a Primary Care Provider  Learn more about 's in-office and virtual care options: [facility]

## 2023-01-21 NOTE — Progress Notes (Signed)
Virtual Visit Consent   Sara Nicholson, you are scheduled for a virtual visit with a [facility] provider today. Just as with appointments in the office, your consent must be obtained to participate. Your consent will be active for this visit and any virtual visit you may have with one of our providers in the next 365 days. If you have a MyChart account, a copy of this consent can be sent to you electronically.  As this is a virtual visit, video technology does not allow for your provider to perform a traditional examination. This may limit your provider's ability to fully assess your condition. If your provider identifies any concerns that need to be evaluated in person or the need to arrange testing (such as labs, EKG, etc.), we will make arrangements to do so. Although advances in technology are sophisticated, we cannot ensure that it will always work on either your end or our end. If the connection with a video visit is poor, the visit may have to be switched to a telephone visit. With either a video or telephone visit, we are not always able to ensure that we have a secure connection.  By engaging in this virtual visit, you consent to the provision of healthcare and authorize for your insurance to be billed (if applicable) for the services provided during this visit. Depending on your insurance coverage, you may receive a charge related to this service.  I need to obtain your verbal consent now. Are you willing to proceed with your visit today? Sara Nicholson has provided verbal consent on 01/21/2023 for a virtual visit (video or telephone). Piedad Climes, [place]  Date: 01/21/2023 10:16 AM  Virtual Visit via Video Note   I, Piedad Climes, connected with  Sara Nicholson  ([id number], 03/10/1956) on 01/21/23 at 10:30 AM EDT by a video-enabled telemedicine application and verified that I am speaking with the correct person using two identifiers.  Location: Patient: Virtual Visit Location  Patient: Home Provider: Virtual Visit Location Provider: Home Office   I discussed the limitations of evaluation and management by telemedicine and the availability of in person appointments. The patient expressed understanding and agreed to proceed.    History of Present Illness: Sara Nicholson is a 67 y.o. who identifies as a female who was assigned female at birth, and is being seen today for URI symptoms starting the middle of last week with head congestion, chest congestion, sinus pressure and a rattling cough. Notes symptoms have continued to progress since onset, now with sinus pain, tooth pain and ear pain. Initially with some chest tightness that has resolved. Denies objective fever but noted feeling hot at nighttime over the weekend. Denies sick contact.   OTC -- Dayquil/Nyquil, Ibuprofen, OTC antihistamine Taken two at-home COVID tests which were negative.   HPI: HPI  Problems:  Patient Active Problem List   Diagnosis Date Noted   Primary osteoarthritis of right knee 01/15/2023   Hyperlipidemia 11/08/2022   Class 2 severe obesity due to excess calories with serious comorbidity and body mass index (BMI) of 37.0 to 37.9 in adult 11/08/2022   Depression 05/07/2022   Parkinson's disease 01/16/2021    Allergies:  Allergies  Allergen Reactions   Terbinafine Hcl    Medications:  Current Outpatient Medications:    benzonatate (TESSALON) 100 MG capsule, Take 1 capsule (100 mg total) by mouth 3 (three) times daily as needed for cough., Disp: 30 capsule, Rfl: 0   doxycycline (VIBRA-TABS) 100 MG  tablet, Take 1 tablet (100 mg total) by mouth 2 (two) times daily., Disp: 20 tablet, Rfl: 0   carbidopa-levodopa (SINEMET IR) 25-100 MG tablet, TAKE ONE TABLET BY MOUTH THREE TIMES A DAY (7AM/11AM/4PM), Disp: 270 tablet, Rfl: 0   diclofenac Sodium (VOLTAREN) 1 % GEL, Apply 2 g topically 3 (three) times daily as needed., Disp: 100 g, Rfl: 0   escitalopram (LEXAPRO) 20 MG tablet, Take 1 tablet  (20 mg total) by mouth daily., Disp: 90 tablet, Rfl: 2   pramipexole (MIRAPEX) 0.5 MG tablet, TAKE ONE TABLET BY MOUTH THREE TIMES A DAY (AT 8:00AM, AT 1:00PM, AND AT 6:00PM), Disp: 270 tablet, Rfl: 0  Current Facility-Administered Medications:    0.9 %  sodium chloride infusion, 500 mL, Intravenous, Once, Danis, Starr Lake III, MD  Observations/Objective: Patient is well-developed, well-nourished in no acute distress.  Resting comfortably at home.  Head is normocephalic, atraumatic.  No labored breathing. Speech is clear and coherent with logical content.  Patient is alert and oriented at baseline.   Assessment and Plan: 1. Acute bacterial sinusitis - benzonatate (TESSALON) 100 MG capsule; Take 1 capsule (100 mg total) by mouth 3 (three) times daily as needed for cough.  Dispense: 30 capsule; Refill: 0 - doxycycline (VIBRA-TABS) 100 MG tablet; Take 1 tablet (100 mg total) by mouth 2 (two) times daily.  Dispense: 20 tablet; Refill: 0  Rx Doxycycline.  Increase fluids.  Rest.  Saline nasal spray.  Probiotic.  Mucinex as directed.  Humidifier in bedroom. Tessalon per orders.  Call or return to clinic if symptoms are not improving.   Follow Up Instructions: I discussed the assessment and treatment plan with the patient. The patient was provided an opportunity to ask questions and all were answered. The patient agreed with the plan and demonstrated an understanding of the instructions.  A copy of instructions were sent to the patient via MyChart unless otherwise noted below.   The patient was advised to call back or seek an in-person evaluation if the symptoms worsen or if the condition fails to improve as anticipated.  Time:  I spent 10 minutes with the patient via telehealth technology discussing the above problems/concerns.    Piedad Climes, PA-C

## 2023-01-22 ENCOUNTER — Encounter: Payer: Self-pay | Admitting: Family Medicine

## 2023-01-23 ENCOUNTER — Ambulatory Visit (INDEPENDENT_AMBULATORY_CARE_PROVIDER_SITE_OTHER): Payer: Medicare Other

## 2023-01-23 ENCOUNTER — Ambulatory Visit: Payer: Medicare Other | Admitting: Sports Medicine

## 2023-01-23 VITALS — BP 132/80 | HR 79 | Ht 64.0 in | Wt 216.0 lb

## 2023-01-23 DIAGNOSIS — G8929 Other chronic pain: Secondary | ICD-10-CM

## 2023-01-23 DIAGNOSIS — M25561 Pain in right knee: Secondary | ICD-10-CM

## 2023-01-23 NOTE — Progress Notes (Signed)
Sara Nicholson D.[facility] [address] [place] [address] Phone: [phone]   Assessment and Plan:     1.  Acute pain of right knee -Acute, uncomplicated, initial sports medicine visit - Most consistent with intra-articular knee pain.  Could be flare of trace osteoarthritis versus mild meniscal pathology based on HPI, physical exam, x-ray imaging - Patient did get some relief from prednisone, however continues to have pain, and especially has discomfort in posterior knee - Discussed intra-articular CSI versus Baker's cyst CSI.  Discussed risks of Baker's cyst recurring if we do not treat intra-articular knee pain, so patient elected for intra-articular knee CSI.  Tolerated well per note below - X-ray obtained in clinic.  My interpretation: No acute fracture or dislocation.  No significant degenerative changes.  Overall unremarkable imaging - DG Knee AP/LAT W/Sunrise Right; Future   Procedure: Knee Joint Injection Side: Right Indication: Right knee pain  Risks explained and consent was given verbally. The site was cleaned with alcohol prep. A needle was introduced with an anterio-lateral approach. Injection given using 2mL of 1% lidocaine without epinephrine and 1mL of kenalog /ml. This was well tolerated and resulted in symptomatic relief.  Needle was removed, hemostasis achieved, and post injection instructions were explained.   Pt was advised to call or return to clinic if these symptoms worsen or fail to improve as anticipated.   Pertinent previous records reviewed include sports medicine note 01/15/2023   Follow Up: 3 to 4 weeks for reevaluation.  Could consider advanced imaging versus physical therapy versus NSAID course based on presentation   Subjective:   I, Sara Nicholson, am serving as a Neurosurgeon for Sara Nicholson  Chief Complaint: right  knee pain   HPI:   01/24/2023 Patient is a 67 year old female complaining of  knee pain. Patient states pain is occurring in the posterior aspect of the knee as well as the anterior aspect.  She is active and exercise on a regular basis. She has had pain for a month  She felt a twinge in the knee when she was performing Zumba.  She has swelling in her knee, no numbness or tingling   Knee Pain  The incident occurred a two weeks  ago. The incident occurred at the gym. The injury mechanism was a twisting injury. The pain is present in the right knee. The quality of the pain is described as aching. The pain is severe. The pain has been Constant since onset. Pertinent negatives include no inability to bear weight, loss of motion, loss of sensation, muscle weakness, numbness or tingling. She reports no foreign bodies present. The symptoms are aggravated by movement, palpation and weight bearing. She has tried acetaminophen and NSAIDs for the symptoms. The treatment provided mild relief.  Onset after kick boxing and zumba class.  Relevant Historical Information: Parkinson  Additional pertinent review of systems negative.   Current Outpatient Medications:    benzonatate (TESSALON) 100 MG capsule, Take 1 capsule (100 mg total) by mouth 3 (three) times daily as needed for cough., Disp: 30 capsule, Rfl: 0   carbidopa-levodopa (SINEMET IR) 25-100 MG tablet, TAKE ONE TABLET BY MOUTH THREE TIMES A DAY (7AM/11AM/4PM), Disp: 270 tablet, Rfl: 0   diclofenac Sodium (VOLTAREN) 1 % GEL, Apply 2 g topically 3 (three) times daily as needed., Disp: 100 g, Rfl: 0   doxycycline (VIBRA-TABS) 100 MG tablet, Take 1 tablet (100 mg total) by mouth 2 (two) times daily., Disp:  20 tablet, Rfl: 0   escitalopram (LEXAPRO) 20 MG tablet, Take 1 tablet (20 mg total) by mouth daily., Disp: 90 tablet, Rfl: 2   pramipexole (MIRAPEX) 0.5 MG tablet, TAKE ONE TABLET BY MOUTH THREE TIMES A DAY (AT 8:00AM, AT 1:00PM, AND AT 6:00PM), Disp: 270 tablet, Rfl: 0   Objective:     Vitals:   01/23/23 1410  BP: 132/80   Pulse: 79  SpO2: 97%  Weight: 216 lb (98 kg)  Height:  (1.626 m)      Body mass index is 37.08 kg/m.    Physical Exam:    General:  awake, alert oriented, no acute distress nontoxic Skin: no suspicious lesions or rashes Neuro:sensation intact and strength 5/5 with no deficits, no atrophy, normal muscle tone Psych: No signs of anxiety, depression or other mood disorder  Right knee: Difficult to assess swelling due to body habitus, however knees looked similar bilaterally No deformity Neg fluid wave, joint milking ROM Flex 100, Ext 0 NTTP over the quad tendon, medial fem condyle, lat fem condyle, patella, plica, patella tendon, tibial tuberostiy, fibular head, posterior fossa, pes anserine bursa, gerdy's tubercle, medial jt line, lateral jt line Neg anterior and posterior drawer Neg lachman Neg sag sign Negative varus stress Negative valgus stress Negative McMurray + Thessaly for posterior knee pain  Gait normal    Electronically signed by:  Sara Nicholson [facility] 4:43 PM 01/23/23

## 2023-01-23 NOTE — Patient Instructions (Addendum)
Good to see you  Knee HEP  Recommend stationary bike, elliptical, and water aerobics No exercise class for 1 week then can gradually introduce as tolerated Use tylenol for day to day pain relief  3 week follow up

## 2023-01-24 DIAGNOSIS — G20A1 Parkinson's disease without dyskinesia, without mention of fluctuations: Secondary | ICD-10-CM | POA: Diagnosis not present

## 2023-01-30 DIAGNOSIS — M25559 Pain in unspecified hip: Secondary | ICD-10-CM | POA: Diagnosis not present

## 2023-01-30 DIAGNOSIS — M542 Cervicalgia: Secondary | ICD-10-CM | POA: Diagnosis not present

## 2023-02-05 ENCOUNTER — Ambulatory Visit: Payer: Medicare Other | Admitting: Family Medicine

## 2023-02-06 DIAGNOSIS — M25559 Pain in unspecified hip: Secondary | ICD-10-CM | POA: Diagnosis not present

## 2023-02-06 DIAGNOSIS — M542 Cervicalgia: Secondary | ICD-10-CM | POA: Diagnosis not present

## 2023-02-12 NOTE — Progress Notes (Unsigned)
    Sara Nicholson D.[facility] [address] [place] [address] Phone: [phone]   Assessment and Plan:     There are no diagnoses linked to this encounter.  ***   Pertinent previous records reviewed include ***   Follow Up: ***     Subjective:   I, Sara Nicholson, am serving as a Neurosurgeon for Doctor Richardean Sale   Chief Complaint: right  knee pain    HPI:    01/24/2023 Patient is a 67 year old female complaining of knee pain. Patient states pain is occurring in the posterior aspect of the knee as well as the anterior aspect.  She is active and exercise on a regular basis. She has had pain for a month  She felt a twinge in the knee when she was performing Zumba.  She has swelling in her knee, no numbness or tingling    Knee Pain  The incident occurred a two weeks  ago. The incident occurred at the gym. The injury mechanism was a twisting injury. The pain is present in the right knee. The quality of the pain is described as aching. The pain is severe. The pain has been Constant since onset. Pertinent negatives include no inability to bear weight, loss of motion, loss of sensation, muscle weakness, numbness or tingling. She reports no foreign bodies present. The symptoms are aggravated by movement, palpation and weight bearing. She has tried acetaminophen and NSAIDs for the symptoms. The treatment provided mild relief.  Onset after kick boxing and zumba class.  02/13/2023 Patient states     Relevant Historical Information: Parkinson  Additional pertinent review of systems negative.   Current Outpatient Medications:    benzonatate (TESSALON) 100 MG capsule, Take 1 capsule (100 mg total) by mouth 3 (three) times daily as needed for cough., Disp: 30 capsule, Rfl: 0   carbidopa-levodopa (SINEMET IR) 25-100 MG tablet, TAKE ONE TABLET BY MOUTH THREE TIMES A DAY (7AM/11AM/4PM), Disp: 270 tablet, Rfl: 0   diclofenac Sodium (VOLTAREN) 1 % GEL,  Apply 2 g topically 3 (three) times daily as needed., Disp: 100 g, Rfl: 0   doxycycline (VIBRA-TABS) 100 MG tablet, Take 1 tablet (100 mg total) by mouth 2 (two) times daily., Disp: 20 tablet, Rfl: 0   escitalopram (LEXAPRO) 20 MG tablet, Take 1 tablet (20 mg total) by mouth daily., Disp: 90 tablet, Rfl: 2   pramipexole (MIRAPEX) 0.5 MG tablet, TAKE ONE TABLET BY MOUTH THREE TIMES A DAY (AT 8:00AM, AT 1:00PM, AND AT 6:00PM), Disp: 270 tablet, Rfl: 0   Objective:     There were no vitals filed for this visit.    There is no height or weight on file to calculate BMI.    Physical Exam:    ***   Electronically signed by:  Sara Nicholson [facility] 7:25 AM 02/12/23

## 2023-02-13 ENCOUNTER — Ambulatory Visit: Payer: Medicare Other | Admitting: Sports Medicine

## 2023-02-13 VITALS — BP 124/82 | HR 80 | Ht 64.0 in | Wt 210.0 lb

## 2023-02-13 DIAGNOSIS — G8929 Other chronic pain: Secondary | ICD-10-CM

## 2023-02-13 DIAGNOSIS — M25561 Pain in right knee: Secondary | ICD-10-CM

## 2023-02-13 DIAGNOSIS — M1711 Unilateral primary osteoarthritis, right knee: Secondary | ICD-10-CM | POA: Diagnosis not present

## 2023-02-13 MED ORDER — MELOXICAM 15 MG PO TABS: 15.0000 mg | ORAL_TABLET | Freq: Every day | ORAL | 0 refills | Status: DC

## 2023-02-13 NOTE — Patient Instructions (Addendum)
Good to see you Right knee MRI  - Start meloxicam 15 mg daily x2 weeks.  If still having pain after 2 weeks, complete 3rd-week of meloxicam. May use remaining meloxicam as needed once daily for pain control.  Do not to use additional NSAIDs while taking meloxicam.  May use Tylenol [phone] mg 2 to 3 times a day for breakthrough pain. Follow up 3 days after MRI to discuss results

## 2023-02-15 DIAGNOSIS — M542 Cervicalgia: Secondary | ICD-10-CM | POA: Diagnosis not present

## 2023-02-15 DIAGNOSIS — M25559 Pain in unspecified hip: Secondary | ICD-10-CM | POA: Diagnosis not present

## 2023-02-16 ENCOUNTER — Ambulatory Visit (INDEPENDENT_AMBULATORY_CARE_PROVIDER_SITE_OTHER): Payer: Medicare Other

## 2023-02-16 DIAGNOSIS — G8929 Other chronic pain: Secondary | ICD-10-CM

## 2023-02-16 DIAGNOSIS — M25561 Pain in right knee: Secondary | ICD-10-CM

## 2023-02-16 DIAGNOSIS — M1711 Unilateral primary osteoarthritis, right knee: Secondary | ICD-10-CM

## 2023-02-25 NOTE — Progress Notes (Unsigned)
Sara Nicholson D.[facility] [address] [place] [address] Phone: [phone]   Assessment and Plan:    1. Chronic pain of right knee 2. Primary osteoarthritis of right knee 3. Insufficiency fracture of medial femoral condyle (HCC)  -Chronic with exacerbation, subsequent visit - Reviewed patient's MRI which included nondepressed, nondisplaced subchondral insufficiency fracture of anterior weightbearing surface of medial femoral condyle with severe surrounding bone marrow edema, high-grade partial-thickness cartilage loss of medial femoral-tibial compartment, mild partial-thickness loss of lateral femoral-tibial compartment, and radial tear posterior horn medial meniscus - Despite patient's multiple findings on MRI, she has significantly improved with relative rest, meloxicam course, HEP.  We will attempt to treat patient's pain and pathology with conservative therapy, though discussed that ultimately patient may require evaluation by orthopedic surgery and potential knee replacement based on findings - Start Tylenol 500 to 1000 mg tablets 2-3 times a day for day-to-day pain relief - Discontinue daily meloxicam and use remainder as needed.  Recommend using meloxicam no more frequently than 1-2 times per week - Continue HEP - Discussed medial off loader brace which may be beneficial to patient.  She will contact us if she would like an order placed for this brace.  Pertinent previous records reviewed include knee MRI 02/16/2023   Follow Up: 4 to 8 weeks for reevaluation to discuss if conservative therapy has been beneficial.  Could consider medial off loader brace versus CSI versus orthopedic referral   Subjective:   I, Jerene Canny, am serving as a Neurosurgeon for Doctor Richardean Sale   Chief Complaint: right  knee pain    HPI:    01/24/2023 Patient is a 67 year old female complaining of knee pain. Patient states pain is occurring in the  posterior aspect of the knee as well as the anterior aspect.  She is active and exercise on a regular basis. She has had pain for a month  She felt a twinge in the knee when she was performing Zumba.  She has swelling in her knee, no numbness or tingling    Knee Pain  The incident occurred a two weeks  ago. The incident occurred at the gym. The injury mechanism was a twisting injury. The pain is present in the right knee. The quality of the pain is described as aching. The pain is severe. The pain has been Constant since onset. Pertinent negatives include no inability to bear weight, loss of motion, loss of sensation, muscle weakness, numbness or tingling. She reports no foreign bodies present. The symptoms are aggravated by movement, palpation and weight bearing. She has tried acetaminophen and NSAIDs for the symptoms. The treatment provided mild relief.  Onset after kick boxing and zumba class.   02/13/2023 Patient states that her knee is a little better, sitting , standing, and walking for a prolonged period is painful , now has medial knee pain , she is frustrated    02/26/2023 Patient states that she is feeling so much better       Relevant Historical Information: Parkinson Additional pertinent review of systems negative.   Current Outpatient Medications:    benzonatate (TESSALON) 100 MG capsule, Take 1 capsule (100 mg total) by mouth 3 (three) times daily as needed for cough., Disp: 30 capsule, Rfl: 0   carbidopa-levodopa (SINEMET IR) 25-100 MG tablet, TAKE ONE TABLET BY MOUTH THREE TIMES A DAY (7AM/11AM/4PM), Disp: 270 tablet, Rfl: 0   diclofenac Sodium (VOLTAREN) 1 % GEL, Apply 2 g topically  3 (three) times daily as needed., Disp: 100 g, Rfl: 0   doxycycline (VIBRA-TABS) 100 MG tablet, Take 1 tablet (100 mg total) by mouth 2 (two) times daily., Disp: 20 tablet, Rfl: 0   escitalopram (LEXAPRO) 20 MG tablet, Take 1 tablet (20 mg total) by mouth daily., Disp: 90 tablet, Rfl: 2   meloxicam  (MOBIC) 15 MG tablet, Take 1 tablet (15 mg total) by mouth daily., Disp: 30 tablet, Rfl: 0   pramipexole (MIRAPEX) 0.5 MG tablet, TAKE ONE TABLET BY MOUTH THREE TIMES A DAY (AT 8:00AM, AT 1:00PM, AND AT 6:00PM), Disp: 270 tablet, Rfl: 0   Objective:     Vitals:   02/26/23 0906  BP: 120/80  Pulse: 78  SpO2: 99%  Weight: 214 lb (97.1 kg)  Height: 5\' 4"  (1.626 m)      Body mass index is 36.73 kg/m.    Physical Exam:    General:  awake, alert oriented, no acute distress nontoxic Skin: no suspicious lesions or rashes Neuro:sensation intact and strength 5/5 with no deficits, no atrophy, normal muscle tone Psych: No signs of anxiety, depression or other mood disorder   Right knee: No swelling No deformity Neg fluid wave, joint milking ROM Flex 110, Ext 0 TTP medial joint line NTTP over the quad tendon, medial fem condyle, lat fem condyle, patella, plica, patella tendon, tibial tuberostiy, fibular head, posterior fossa, pes anserine bursa, gerdy's tubercle,   lateral jt line Neg anterior and posterior drawer Neg lachman Neg sag sign Negative varus stress Negative valgus stress Negative McMurray Positive Thessaly   Gait normal       Electronically signed by:  Sara Nicholson [facility] 9:38 AM 02/26/23

## 2023-02-26 ENCOUNTER — Ambulatory Visit: Payer: Medicare Other | Admitting: Sports Medicine

## 2023-02-26 VITALS — BP 120/80 | HR 78 | Ht 64.0 in | Wt 214.0 lb

## 2023-02-26 DIAGNOSIS — M25561 Pain in right knee: Secondary | ICD-10-CM

## 2023-02-26 DIAGNOSIS — G8929 Other chronic pain: Secondary | ICD-10-CM | POA: Diagnosis not present

## 2023-02-26 DIAGNOSIS — M84453A Pathological fracture, unspecified femur, initial encounter for fracture: Secondary | ICD-10-CM | POA: Diagnosis not present

## 2023-02-26 DIAGNOSIS — M1711 Unilateral primary osteoarthritis, right knee: Secondary | ICD-10-CM | POA: Diagnosis not present

## 2023-02-26 NOTE — Patient Instructions (Addendum)
Good to see you Tylenol [phone] mg 2-3 times a day for pain relief  Discontinue meloxicam and use remainder as needed no more than 1-2 times per week  Continue HEP  We discussed a medial off loader brace contact if you would like to be referred for a fitting  Restart physical activity gradually at 50% to 70% and so on  4-8 week follow up

## 2023-02-27 DIAGNOSIS — M542 Cervicalgia: Secondary | ICD-10-CM | POA: Diagnosis not present

## 2023-02-27 DIAGNOSIS — M25559 Pain in unspecified hip: Secondary | ICD-10-CM | POA: Diagnosis not present

## 2023-03-06 DIAGNOSIS — M25559 Pain in unspecified hip: Secondary | ICD-10-CM | POA: Diagnosis not present

## 2023-03-06 DIAGNOSIS — M542 Cervicalgia: Secondary | ICD-10-CM | POA: Diagnosis not present

## 2023-03-14 ENCOUNTER — Telehealth: Payer: Self-pay | Admitting: Neurology

## 2023-03-14 NOTE — Telephone Encounter (Signed)
Pt called in requesting to speak with Lavella Hammock

## 2023-03-15 DIAGNOSIS — M25559 Pain in unspecified hip: Secondary | ICD-10-CM | POA: Diagnosis not present

## 2023-03-15 DIAGNOSIS — M542 Cervicalgia: Secondary | ICD-10-CM | POA: Diagnosis not present

## 2023-03-18 DIAGNOSIS — L570 Actinic keratosis: Secondary | ICD-10-CM | POA: Diagnosis not present

## 2023-03-18 DIAGNOSIS — L821 Other seborrheic keratosis: Secondary | ICD-10-CM | POA: Diagnosis not present

## 2023-03-18 DIAGNOSIS — D2272 Melanocytic nevi of left lower limb, including hip: Secondary | ICD-10-CM | POA: Diagnosis not present

## 2023-03-18 DIAGNOSIS — D235 Other benign neoplasm of skin of trunk: Secondary | ICD-10-CM | POA: Diagnosis not present

## 2023-03-18 DIAGNOSIS — L738 Other specified follicular disorders: Secondary | ICD-10-CM | POA: Diagnosis not present

## 2023-03-18 DIAGNOSIS — L565 Disseminated superficial actinic porokeratosis (DSAP): Secondary | ICD-10-CM | POA: Diagnosis not present

## 2023-03-18 DIAGNOSIS — D485 Neoplasm of uncertain behavior of skin: Secondary | ICD-10-CM | POA: Diagnosis not present

## 2023-03-18 DIAGNOSIS — D2261 Melanocytic nevi of right upper limb, including shoulder: Secondary | ICD-10-CM | POA: Diagnosis not present

## 2023-03-18 DIAGNOSIS — L918 Other hypertrophic disorders of the skin: Secondary | ICD-10-CM | POA: Diagnosis not present

## 2023-03-18 DIAGNOSIS — D2271 Melanocytic nevi of right lower limb, including hip: Secondary | ICD-10-CM | POA: Diagnosis not present

## 2023-03-18 DIAGNOSIS — D2262 Melanocytic nevi of left upper limb, including shoulder: Secondary | ICD-10-CM | POA: Diagnosis not present

## 2023-03-18 DIAGNOSIS — D1801 Hemangioma of skin and subcutaneous tissue: Secondary | ICD-10-CM | POA: Diagnosis not present

## 2023-03-18 DIAGNOSIS — D225 Melanocytic nevi of trunk: Secondary | ICD-10-CM | POA: Diagnosis not present

## 2023-03-19 ENCOUNTER — Other Ambulatory Visit: Payer: Self-pay | Admitting: Neurology

## 2023-03-19 DIAGNOSIS — G20A1 Parkinson's disease without dyskinesia, without mention of fluctuations: Secondary | ICD-10-CM

## 2023-03-20 DIAGNOSIS — M542 Cervicalgia: Secondary | ICD-10-CM | POA: Diagnosis not present

## 2023-03-20 DIAGNOSIS — M25559 Pain in unspecified hip: Secondary | ICD-10-CM | POA: Diagnosis not present

## 2023-03-27 DIAGNOSIS — M25559 Pain in unspecified hip: Secondary | ICD-10-CM | POA: Diagnosis not present

## 2023-03-27 DIAGNOSIS — M542 Cervicalgia: Secondary | ICD-10-CM | POA: Diagnosis not present

## 2023-04-02 NOTE — Progress Notes (Signed)
[name] D.[facility] [address] [place] [address] Phone: [phone]   Assessment and Plan:     1. Chronic pain of right knee 2. Primary osteoarthritis of right knee 3. Insufficiency fracture of medial femoral condyle (HCC) -Chronic with exacerbation, subsequent visit - Patient's flare of chronic pain has resolved with relative rest, decreasing physical activity, course of meloxicam, Tylenol use - Patient did have nondepressed, nondisplaced subchondral insufficiency fracture of medial femoral condyle and high-grade cartilage loss in medial tibiofemoral compartment.  At this time, these are well-controlled with conservative therapy.  Could consider CSI versus medial off loader brace versus referral to orthopedic surgery if pain returns - May gradually return to physical activity including body combat, yoga, Zumba - Use Tylenol as needed for day-to-day pain relief  4. Acute pain of left knee  -Acute, uncomplicated, initial visit - 5 days of left knee pain after a "popping" that occurred while walking.  Pain has significantly improved with meloxicam over the past 4 days to the point where patient has no pain with weightbearing and only mild tenderness over medial knee compartment, with otherwise unremarkable physical exam.  I suspect flare of underlying osteoarthritis - Continue meloxicam for 1 to 2 weeks and then discontinue NSAIDs - May use Tylenol as needed for day-to-day pain relief - May gradually return to physical activity including body combat, yoga, Zumba  Pertinent previous records reviewed include none   Follow Up: 3 weeks for reevaluation of bilateral knee pain to ensure patient is still improving.  Could consider left knee x-ray if no improvement   Subjective:   I, Moenique Parris, am serving as a Neurosurgeon for Doctor Richardean Sale   Chief Complaint: right  knee pain    HPI:    01/24/2023 Patient is a 67 year old female  complaining of knee pain. Patient states pain is occurring in the posterior aspect of the knee as well as the anterior aspect.  She is active and exercise on a regular basis. She has had pain for a month  She felt a twinge in the knee when she was performing Zumba.  She has swelling in her knee, no numbness or tingling    Knee Pain  The incident occurred a two weeks  ago. The incident occurred at the gym. The injury mechanism was a twisting injury. The pain is present in the right knee. The quality of the pain is described as aching. The pain is severe. The pain has been Constant since onset. Pertinent negatives include no inability to bear weight, loss of motion, loss of sensation, muscle weakness, numbness or tingling. She reports no foreign bodies present. The symptoms are aggravated by movement, palpation and weight bearing. She has tried acetaminophen and NSAIDs for the symptoms. The treatment provided mild relief.  Onset after kick boxing and zumba class.   02/13/2023 Patient states that her knee is a little better, sitting , standing, and walking for a prolonged period is painful , now has medial knee pain , she is frustrated    02/26/2023 Patient states that she is feeling so much better    04/09/2023 Patient states that her right knee is  better just an occasional twinge. Friday night her left knee popped in the back , she had to have help getting up. She has RICEd, she has been improving since Saturday . Her calf and the back of her knee feels tight but that pop had also relieved the pressure  she had been feeling   Relevant Historical Information: Parkinson  Additional pertinent review of systems negative.   Current Outpatient Medications:    carbidopa-levodopa (SINEMET IR) 25-100 MG tablet, TAKE ONE TABLET BY MOUTH THREE TIMES A DAY ( 7 IN THE MORNING , 11 IN THE MORNING AND 4 P.M. ), Disp: 270 tablet, Rfl: 0   escitalopram (LEXAPRO) 20 MG tablet, Take 1 tablet (20 mg total) by mouth  daily., Disp: 90 tablet, Rfl: 2   meloxicam (MOBIC) 15 MG tablet, Take 1 tablet (15 mg total) by mouth daily., Disp: 30 tablet, Rfl: 0   pramipexole (MIRAPEX) 0.5 MG tablet, TAKE ONE TABLET BY MOUTH THREE TIMES A DAY (8:00AM, 1:00PM, AND 6:00PM), Disp: 270 tablet, Rfl: 0   benzonatate (TESSALON) 100 MG capsule, Take 1 capsule (100 mg total) by mouth 3 (three) times daily as needed for cough., Disp: 30 capsule, Rfl: 0   diclofenac Sodium (VOLTAREN) 1 % GEL, Apply 2 g topically 3 (three) times daily as needed., Disp: 100 g, Rfl: 0   doxycycline (VIBRA-TABS) 100 MG tablet, Take 1 tablet (100 mg total) by mouth 2 (two) times daily., Disp: 20 tablet, Rfl: 0   Objective:     Vitals:   04/09/23 0909  BP: 124/80  Pulse: 68  SpO2: 99%  Weight: 214 lb (97.1 kg)  Height: 5\' 4"  (1.626 m)      Body mass index is 36.73 kg/m.    Physical Exam:    General:  awake, alert oriented, no acute distress nontoxic Skin: no suspicious lesions or rashes Neuro:sensation intact and strength 5/5 with no deficits, no atrophy, normal muscle tone Psych: No signs of anxiety, depression or other mood disorder  Left knee: No swelling No deformity Neg fluid wave, joint milking ROM Flex 110, Ext 0 TTP medial femoral condyle, medial joint line, pes anserine bursa NTTP over the quad tendon,   lat fem condyle, patella, plica, patella tendon, tibial tuberostiy, fibular head, posterior fossa,   gerdy's tubercle,   lateral jt line Neg anterior and posterior drawer Neg lachman Neg sag sign Negative varus stress Negative valgus stress Negative McMurray Negative Thessaly  Gait normal    Electronically signed by:  [name] [facility] 9:51 AM 04/09/23

## 2023-04-09 ENCOUNTER — Ambulatory Visit: Payer: Medicare Other | Admitting: Sports Medicine

## 2023-04-09 VITALS — BP 124/80 | HR 68 | Ht 64.0 in | Wt 214.0 lb

## 2023-04-09 DIAGNOSIS — M1711 Unilateral primary osteoarthritis, right knee: Secondary | ICD-10-CM

## 2023-04-09 DIAGNOSIS — G8929 Other chronic pain: Secondary | ICD-10-CM

## 2023-04-09 DIAGNOSIS — M25561 Pain in right knee: Secondary | ICD-10-CM | POA: Diagnosis not present

## 2023-04-09 DIAGNOSIS — M84453A Pathological fracture, unspecified femur, initial encounter for fracture: Secondary | ICD-10-CM | POA: Diagnosis not present

## 2023-04-09 DIAGNOSIS — M25562 Pain in left knee: Secondary | ICD-10-CM
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
## 2023-04-09 NOTE — Patient Instructions (Signed)
-   Start meloxicam 15 mg daily 1-2 weeks. May use Tylenol [phone] mg 2 to 3 times a day for breakthrough pain. May gradually restart activities including body combat, yoga, and zumba 3 week follow up

## 2023-04-10 DIAGNOSIS — M542 Cervicalgia: Secondary | ICD-10-CM | POA: Diagnosis not present

## 2023-04-10 DIAGNOSIS — M25559 Pain in unspecified hip: Secondary | ICD-10-CM | POA: Diagnosis not present

## 2023-04-12 ENCOUNTER — Encounter: Payer: Self-pay | Admitting: Neurology

## 2023-04-12 ENCOUNTER — Ambulatory Visit: Payer: Medicare Other | Admitting: Neurology

## 2023-04-12 DIAGNOSIS — G20A1 Parkinson's disease without dyskinesia, without mention of fluctuations: Secondary | ICD-10-CM

## 2023-04-12 MED ORDER — ESCITALOPRAM OXALATE 20 MG PO TABS: 20.0000 mg | ORAL_TABLET | Freq: Every day | ORAL | 2 refills | Status: DC

## 2023-04-12 MED ORDER — PRAMIPEXOLE DIHYDROCHLORIDE 0.5 MG PO TABS: ORAL_TABLET | ORAL | 2 refills | Status: DC

## 2023-04-12 MED ORDER — CARBIDOPA-LEVODOPA 25-100 MG PO TABS: ORAL_TABLET | ORAL | 2 refills | Status: DC

## 2023-04-12 NOTE — Patient Instructions (Signed)
Local and Online Resources for Power over Parkinson's Group  June 2024   LOCAL [place] PARKINSON'S GROUPS   Power over Parkinson's Group:    Power Over Parkinson's Patient Education Group will be Wednesday, JULY 10th-*PLEASE NOTE:  We will not be having a June meeting.  Our next meeting will be in July.  We apologize for the inconvenience.   Power over Parkinson's and Care Partner Groups will meet together, with plans for separate break out session for caregivers, depending on topic/speaker Upcoming Power over Parkinson's Meetings/Care Partner Support:  2nd Wednesdays of the month at 2 pm:   No regular June meeting, July 10th  Contact Amy Marriott at [email or web address] or Sarah Chambers at [email or web address] if interested in participating in this group    LOCAL EVENTS AND NEW OFFERINGS  PLEASE NOTE:  There will be no June Power over Parkinson's meeting (NO MEETING June 12th) Picnic Party for Parkinson's.   [facility] Movement Disorders Program Celebration.  June 19th 1-3 pm.  Contact Sarah Chambers at [email or web address] Parkinson's Social Game Night.  First Thursday of each month, 2:00-4:00 pm.  *Next date is June 6th*.  [facility], [place].  Contact [email or web address] if interested. Parkinson's CarePartner Group for Men is in the works, if interested email [email or web address] [employer] Chair Yoga classes "Train and Gain", Fridays 10 am, [employer].  Contact Gina at [phone].  Community Fitness Instructor-Led Parkinson's Exercises Classes offering at [facility] [employer]!  TUESDAYS (Chair Yoga)  and Wednesdays (PWR! Moves)  1:00 pm.   Contact Christy Weaver at  [email or web address]  or [phone]  [place] PWR! Moves classes.  Thursdays at 11:45, [facility] [employer].  Free to participate through Parkinson Foundation grant.  Contact Sarah Chambers at [email or web address] or  [phone] to register Drumming for Parkinson's will be held on 2nd and 4th Mondays at 11:00 am.   Located at the [employer] ([address] [place].)  Contact Jane Maydian at [email or web address] or [phone]  [employer] Parkinson's Tai Chi Class, Mondays at 11 am.  Call [phone] for details   ONLINE EDUCATION AND SUPPORT  Parkinson Foundation:  www.parkinson.org  PD Health at Home continues:  Mindfulness Mondays, Wellness Wednesdays, Fitness Fridays  (PWR! Moves as part of Fitness Fridays March 22nd, 1-1:45 pm) Upcoming Education:   Current and Emerging Methods to Aid Parkinson's Diagnosis.  Wednesday, May 29th, 1-2 pm Recognize and Respond to Parkinson's Psychosis.  Wednesday, June 5th, 1-2 pm Parkinsonisms.  Wednesday, June 12th, 1-2 pm Expert Briefing:  Addressing the Challenge of Apathy in Parkinson's.  Wednesday, September 11th, 1-2 pm. Register for virtual education and expert briefings (webinars) at www.[email or web address].org/resources-support/[email or web address]-education Please check out their website to sign up for emails and see their full online offerings     Michael [employer] Fox Foundation:  www.michaeljfox.org   Third Thursday Webinars:  On the third Thursday of every month at 12 p.m. ET, join our free live webinars to learn about various aspects of living with Parkinson's disease and our work to speed medical breakthroughs.  Upcoming Webinar:  You Want to Volunteer for Parkinson's Research: Now What?  Thursday, June 20th at 12 noon. Check out additional information on their website to see their full online offerings    Davis Phinney Foundation:  www.davisphinneyfoundation.org  Upcoming Webinar:  Living Safely with Parkinson's Inside and Outside of your Home.  Thursday, June 13th , 3 pm Series:  Living with Parkinson's Meetup.   Third Thursdays each month, 3 pm    Care Partner Monthly Meetup.  With Connie Carpenter Phinney.  First Tuesday of each month, 2  pm  Check out additional information to Live Well Today on their website    Parkinson and Movement Disorders (PMD) Alliance:  www.pmdalliance.org  [employer]:  Online Education Events  Sign up for emails, which are sent weekly to give you updates on programming and online offerings    Parkinson's Association of [place]:  www.parkinsonassociation.org  Information on online support groups, education events, and online exercises including Yoga, Parkinson's exercises and more-LOTS of information on links to PD resources and online events  Virtual Support Group through Parkinson's Association of [place]; next one is scheduled for Wednesday, June 5th   MOVEMENT AND EXERCISE OPPORTUNITIES  Parkinson's Exercise Class offerings at [facility]. *TUESDAYS* (Chair yoga) and Wednesdays (PWR! Moves)  1:00 pm.   *Please note that PWR! Moves [place] class has ended.  Please consider trying PWR! Moves on Wednesdays at 1 pm at [facility].  Contact Christy Weaver at [email or web address]    Parkinson's Wellness Recovery (PWR! Moves)  www.pwr4life.org  Info on the PWR! Virtual Experience:  You will have access to our expertise?through self-assessment, guided plans that start with the PD-specific fundamentals, educational content, tips, Q&A with an expert, and a growing library of PD-specific pre-recorded and live exercise classes of varying types and intensity - both physical and cognitive! If that is not enough, we offer 1:1 wellness consultations (in-person or virtual) to personalize your PWR! Virtual Experience.   Parkinson Foundation Fitness Fridays:   As part of the PD Health @ Home program, this free video series focuses each week on one aspect of fitness designed to support people living with Parkinson's.? These weekly videos highlight the Parkinson Foundation fitness guidelines for people with Parkinson's disease.   www.parkinson.org/resources-support/online-education/pdhealth#ff  Dance for PD website is offering free, live-stream classes throughout the week, as well as links to digital library of classes:  https://danceforparkinsons.org/  Virtual dance and Pilates for Parkinson's classes: Click on the Community Tab> Parkinson's Movement Initiative Tab.  To register for classes and for more information, visit www.americandancefestival.org and click the "community" tab.   [employer] Parkinson's Cycling Classes   [facility]:  Thursdays @ Noon-Live classes at [employer] (Contact Margaret Hazen at [email or web address]?or [phone])  [facility]: Classes Tuesday, Wednesday and Thursday (contact Marlee at [email or web address] ?or [phone])  [place] Steady Boxing  Varied levels of classes are offered Tuesdays and Thursdays at [facility].   Stretching with Maria weekly class is also offered for people with Parkinson's  To observe a class or for more information, call [phone] or email Hillary Savage at [email or web address]   ADDITIONAL SUPPORT AND RESOURCES  [employer]:  Online Caregiver Education Opportunities:  www.well-springsolutions.org/caregiver-education/caregiver-support-group.  You may also contact Jodi Kolada at [email or web address] or [phone].     [employer] Navigator:  Just1Navigator program, a?free service to help individuals and families through the journey of determining care for older adults.  The "Navigator" is a social worker, Nicole Reynolds, who will speak with a prospective client and/or loved ones to provide an assessment of the situation and a set of recommendations for a personalized care plan -- all free of charge, and whether?[employer] offers the needed service or not. If the need is not a service we provide, we are well-connected with reputable programs in town that we can refer you to.   www.well-springsolutions.org or to speak with the Navigator, call [phone].     

## 2023-04-21 ENCOUNTER — Other Ambulatory Visit: Payer: Self-pay | Admitting: Sports Medicine

## 2023-04-24 DIAGNOSIS — M25559 Pain in unspecified hip: Secondary | ICD-10-CM | POA: Diagnosis not present

## 2023-04-24 DIAGNOSIS — M542 Cervicalgia: Secondary | ICD-10-CM | POA: Diagnosis not present

## 2023-04-29 NOTE — Progress Notes (Unsigned)
    [name] D.[facility] [address] [place] [address] Phone: [phone]   Assessment and Plan:     There are no diagnoses linked to this encounter.  ***   Pertinent previous records reviewed include ***   Follow Up: ***     Subjective:   I, Sara Nicholson, am serving as a Neurosurgeon for Doctor Richardean Sale   Chief Complaint: right  knee pain    HPI:    01/24/2023 Patient is a 67 year old female complaining of knee pain. Patient states pain is occurring in the posterior aspect of the knee as well as the anterior aspect.  She is active and exercise on a regular basis. She has had pain for a month  She felt a twinge in the knee when she was performing Zumba.  She has swelling in her knee, no numbness or tingling    Knee Pain  The incident occurred a two weeks  ago. The incident occurred at the gym. The injury mechanism was a twisting injury. The pain is present in the right knee. The quality of the pain is described as aching. The pain is severe. The pain has been Constant since onset. Pertinent negatives include no inability to bear weight, loss of motion, loss of sensation, muscle weakness, numbness or tingling. She reports no foreign bodies present. The symptoms are aggravated by movement, palpation and weight bearing. She has tried acetaminophen and NSAIDs for the symptoms. The treatment provided mild relief.  Onset after kick boxing and zumba class.   02/13/2023 Patient states that her knee is a little better, sitting , standing, and walking for a prolonged period is painful , now has medial knee pain , she is frustrated    02/26/2023 Patient states that she is feeling so much better    04/09/2023 Patient states that her right knee is  better just an occasional twinge. Friday night her left knee popped in the back , she had to have help getting up. She has RICEd, she has been improving since Saturday . Her calf and the back of her  knee feels tight but that pop had also relieved the pressure she had been feeling   04/30/2023 Patient states    Relevant Historical Information: Parkinson  Additional pertinent review of systems negative.   Current Outpatient Medications:    carbidopa-levodopa (SINEMET IR) 25-100 MG tablet, TAKE ONE TABLET BY MOUTH THREE TIMES A DAY ( 7 IN THE MORNING , 11 IN THE MORNING AND 4 P.M. ), Disp: 270 tablet, Rfl: 2   escitalopram (LEXAPRO) 20 MG tablet, Take 1 tablet (20 mg total) by mouth daily., Disp: 90 tablet, Rfl: 2   meloxicam (MOBIC) 15 MG tablet, Take 1 tablet (15 mg total) by mouth daily., Disp: 30 tablet, Rfl: 0   pramipexole (MIRAPEX) 0.5 MG tablet, TAKE ONE TABLET BY MOUTH THREE TIMES A DAY (8:00AM, 1:00PM, AND 6:00PM), Disp: 270 tablet, Rfl: 2   Objective:     There were no vitals filed for this visit.    There is no height or weight on file to calculate BMI.    Physical Exam:    ***   Electronically signed by:  [name] D.[facility] 7:11 AM 04/29/23

## 2023-04-30 ENCOUNTER — Ambulatory Visit: Payer: Medicare Other | Admitting: Sports Medicine

## 2023-04-30 ENCOUNTER — Ambulatory Visit (INDEPENDENT_AMBULATORY_CARE_PROVIDER_SITE_OTHER): Payer: Medicare Other

## 2023-04-30 ENCOUNTER — Other Ambulatory Visit: Payer: Self-pay

## 2023-04-30 VITALS — BP 130/82 | HR 82 | Ht 64.0 in | Wt 216.0 lb

## 2023-04-30 DIAGNOSIS — M25462 Effusion, left knee: Secondary | ICD-10-CM | POA: Diagnosis not present

## 2023-04-30 DIAGNOSIS — G8929 Other chronic pain: Secondary | ICD-10-CM

## 2023-04-30 DIAGNOSIS — M1711 Unilateral primary osteoarthritis, right knee: Secondary | ICD-10-CM | POA: Diagnosis not present

## 2023-04-30 DIAGNOSIS — M25561 Pain in right knee: Secondary | ICD-10-CM

## 2023-04-30 DIAGNOSIS — M25562 Pain in left knee: Secondary | ICD-10-CM

## 2023-04-30 DIAGNOSIS — M1712 Unilateral primary osteoarthritis, left knee: Secondary | ICD-10-CM

## 2023-04-30 NOTE — Patient Instructions (Signed)
Tylenol [phone] mg 2-3 times a day for pain relief  Stop meloxicam  Continue HEP  2 week follow up

## 2023-05-01 DIAGNOSIS — M542 Cervicalgia: Secondary | ICD-10-CM | POA: Diagnosis not present

## 2023-05-01 DIAGNOSIS — M25559 Pain in unspecified hip: Secondary | ICD-10-CM | POA: Diagnosis not present

## 2023-05-13 NOTE — Progress Notes (Unsigned)
    Sara Nicholson D.[facility] [address] [place] [address] Phone: [phone]   Assessment and Plan:     There are no diagnoses linked to this encounter.  ***   Pertinent previous records reviewed include ***   Follow Up: ***     Subjective:   I, Sara Nicholson, am serving as a Neurosurgeon for Doctor Richardean Sale   Chief Complaint: right  knee pain    HPI:    01/24/2023 Patient is a 67 year old female complaining of knee pain. Patient states pain is occurring in the posterior aspect of the knee as well as the anterior aspect.  She is active and exercise on a regular basis. She has had pain for a month  She felt a twinge in the knee when she was performing Zumba.  She has swelling in her knee, no numbness or tingling    Knee Pain  The incident occurred a two weeks  ago. The incident occurred at the gym. The injury mechanism was a twisting injury. The pain is present in the right knee. The quality of the pain is described as aching. The pain is severe. The pain has been Constant since onset. Pertinent negatives include no inability to bear weight, loss of motion, loss of sensation, muscle weakness, numbness or tingling. She reports no foreign bodies present. The symptoms are aggravated by movement, palpation and weight bearing. She has tried acetaminophen and NSAIDs for the symptoms. The treatment provided mild relief.  Onset after kick boxing and zumba class.   02/13/2023 Patient states that her knee is a little better, sitting , standing, and walking for a prolonged period is painful , now has medial knee pain , she is frustrated    02/26/2023 Patient states that she is feeling so much better    04/09/2023 Patient states that her right knee is  better just an occasional twinge. Friday night her left knee popped in the back , she had to have help getting up. She has RICEd, she has been improving since Saturday . Her calf and the back of her  knee feels tight but that pop had also relieved the pressure she had been feeling   04/30/2023 Patient states right knee is fine . Left knee is killing her    05/14/2023 Patient states    Relevant Historical Information: Parkinson  Additional pertinent review of systems negative.   Current Outpatient Medications:    carbidopa-levodopa (SINEMET IR) 25-100 MG tablet, TAKE ONE TABLET BY MOUTH THREE TIMES A DAY ( 7 IN THE MORNING , 11 IN THE MORNING AND 4 P.M. ), Disp: 270 tablet, Rfl: 2   escitalopram (LEXAPRO) 20 MG tablet, Take 1 tablet (20 mg total) by mouth daily., Disp: 90 tablet, Rfl: 2   meloxicam (MOBIC) 15 MG tablet, Take 1 tablet (15 mg total) by mouth daily., Disp: 30 tablet, Rfl: 0   pramipexole (MIRAPEX) 0.5 MG tablet, TAKE ONE TABLET BY MOUTH THREE TIMES A DAY (8:00AM, 1:00PM, AND 6:00PM), Disp: 270 tablet, Rfl: 2   Objective:     There were no vitals filed for this visit.    There is no height or weight on file to calculate BMI.    Physical Exam:    ***   Electronically signed by:  Sara Nicholson D.[facility] 10:58 AM 05/13/23

## 2023-05-14 ENCOUNTER — Ambulatory Visit: Payer: Medicare Other | Admitting: Sports Medicine

## 2023-05-14 VITALS — BP 122/68 | HR 81 | Ht 64.0 in | Wt 216.0 lb

## 2023-05-14 DIAGNOSIS — M25562 Pain in left knee: Secondary | ICD-10-CM

## 2023-05-14 DIAGNOSIS — M1712 Unilateral primary osteoarthritis, left knee: Secondary | ICD-10-CM

## 2023-05-14 MED ORDER — MELOXICAM 15 MG PO TABS: 15.0000 mg | ORAL_TABLET | Freq: Every day | ORAL | 0 refills | Status: DC

## 2023-05-14 NOTE — Patient Instructions (Signed)
-   Start meloxicam 15 mg daily x2 weeks.  If still having pain after 2 weeks, complete 3rd-week of meloxicam. May use remaining meloxicam as needed once daily for pain control.  Do not to use additional NSAIDs while taking meloxicam.  May use Tylenol [phone] mg 2 to 3 times a day for breakthrough pain. Can restart low extremity activity as tolerated  4 week follow up

## 2023-05-15 DIAGNOSIS — M25559 Pain in unspecified hip: Secondary | ICD-10-CM | POA: Diagnosis not present

## 2023-05-15 DIAGNOSIS — M542 Cervicalgia: Secondary | ICD-10-CM | POA: Diagnosis not present

## 2023-06-05 DIAGNOSIS — M25559 Pain in unspecified hip: Secondary | ICD-10-CM | POA: Diagnosis not present

## 2023-06-05 DIAGNOSIS — M542 Cervicalgia: Secondary | ICD-10-CM | POA: Diagnosis not present

## 2023-06-07 NOTE — Progress Notes (Unsigned)
Sara Nicholson D.[facility] [address] [place] [address] Phone: [phone]   Assessment and Plan:     There are no diagnoses linked to this encounter.  ***   Pertinent previous records reviewed include ***   Follow Up: ***     Subjective:   I, Sara Nicholson, am serving as a Neurosurgeon for Doctor Richardean Sale   Chief Complaint: right  knee pain    HPI:    01/24/2023 Patient is a 67 year old female complaining of knee pain. Patient states pain is occurring in the posterior aspect of the knee as well as the anterior aspect.  She is active and exercise on a regular basis. She has had pain for a month  She felt a twinge in the knee when she was performing Zumba.  She has swelling in her knee, no numbness or tingling    Knee Pain  The incident occurred a two weeks  ago. The incident occurred at the gym. The injury mechanism was a twisting injury. The pain is present in the right knee. The quality of the pain is described as aching. The pain is severe. The pain has been Constant since onset. Pertinent negatives include no inability to bear weight, loss of motion, loss of sensation, muscle weakness, numbness or tingling. She reports no foreign bodies present. The symptoms are aggravated by movement, palpation and weight bearing. She has tried acetaminophen and NSAIDs for the symptoms. The treatment provided mild relief.  Onset after kick boxing and zumba class.   02/13/2023 Patient states that her knee is a little better, sitting , standing, and walking for a prolonged period is painful , now has medial knee pain , she is frustrated    02/26/2023 Patient states that she is feeling so much better    04/09/2023 Patient states that her right knee is  better just an occasional twinge. Friday night her left knee popped in the back , she had to have help getting up. She has RICEd, she has been improving since Saturday . Her calf and the back of her  knee feels tight but that pop had also relieved the pressure she had been feeling   04/30/2023 Patient states right knee is fine . Left knee is killing her    05/14/2023 Patient states tat she is better but, is having some shooting pain down in to her calf that will stop her in her tracks . She has some bruising that appeared Saturday    06/11/2023 Patient states    Relevant Historical Information: Parkinson Additional pertinent review of systems negative.   Current Outpatient Medications:    carbidopa-levodopa (SINEMET IR) 25-100 MG tablet, TAKE ONE TABLET BY MOUTH THREE TIMES A DAY ( 7 IN THE MORNING , 11 IN THE MORNING AND 4 P.M. ), Disp: 270 tablet, Rfl: 2   escitalopram (LEXAPRO) 20 MG tablet, Take 1 tablet (20 mg total) by mouth daily., Disp: 90 tablet, Rfl: 2   meloxicam (MOBIC) 15 MG tablet, Take 1 tablet (15 mg total) by mouth daily., Disp: 30 tablet, Rfl: 0   pramipexole (MIRAPEX) 0.5 MG tablet, TAKE ONE TABLET BY MOUTH THREE TIMES A DAY (8:00AM, 1:00PM, AND 6:00PM), Disp: 270 tablet, Rfl: 2   Objective:     There were no vitals filed for this visit.    There is no height or weight on file to calculate BMI.    Physical Exam:    ***   Electronically  signed by:  Sara Nicholson D.[facility] 7:09 AM 06/07/23

## 2023-06-11 ENCOUNTER — Ambulatory Visit: Payer: Medicare Other | Admitting: Sports Medicine

## 2023-06-11 VITALS — BP 140/82 | HR 68 | Ht 64.0 in | Wt 216.0 lb

## 2023-06-11 DIAGNOSIS — M25561 Pain in right knee: Secondary | ICD-10-CM

## 2023-06-11 DIAGNOSIS — M25562 Pain in left knee: Secondary | ICD-10-CM | POA: Diagnosis not present

## 2023-06-11 DIAGNOSIS — M1712 Unilateral primary osteoarthritis, left knee: Secondary | ICD-10-CM | POA: Diagnosis not present

## 2023-06-11 DIAGNOSIS — G8929 Other chronic pain: Secondary | ICD-10-CM | POA: Diagnosis not present

## 2023-06-11 NOTE — Patient Instructions (Signed)
Tylenol 500 mg 2-3 times a day for pain relief  Stop daily meloxicam and use as needed for breakthrough pain Recommend re starting activities as tolerated (gradually)  7-8 week follow up or sooner if needed

## 2023-06-17 DIAGNOSIS — M25559 Pain in unspecified hip: Secondary | ICD-10-CM | POA: Diagnosis not present

## 2023-06-17 DIAGNOSIS — M542 Cervicalgia: Secondary | ICD-10-CM | POA: Diagnosis not present

## 2023-06-26 DIAGNOSIS — M25559 Pain in unspecified hip: Secondary | ICD-10-CM | POA: Diagnosis not present

## 2023-06-26 DIAGNOSIS — M542 Cervicalgia: Secondary | ICD-10-CM | POA: Diagnosis not present

## 2023-07-10 DIAGNOSIS — M542 Cervicalgia: Secondary | ICD-10-CM | POA: Diagnosis not present

## 2023-07-10 DIAGNOSIS — M25559 Pain in unspecified hip: Secondary | ICD-10-CM | POA: Diagnosis not present

## 2023-07-23 NOTE — Progress Notes (Signed)
[name] D.[facility] [address] [place] [address] Phone: [phone]   Assessment and Plan:     1. Primary osteoarthritis of left knee 2. Chronic pain of left knee -Chronic with exacerbation, subsequent visit - Left knee pain continues to worsen and is limiting day-to-day activities.  This could be related to mild degenerative changes seen on x-ray versus intra-articular pathology versus parkinsonism progression - Patient received about 3 weeks relief from intra-articular left knee CSI.  Based on improvement from CSI for a short timeframe, I believe patient could benefit from transitioning to [place] which hopefully would provide at least 3 months relief.  We will order prior authorization and have patient follow-up for injection once approved - Patient's right knee pain overall has decreased and is stable at this time - Recommend avoiding activities that flare pain.  Recommend low impact exercises such as stationary bike, elliptical, water aerobics.  Patient joined [facility] and can perform all of these activities there   Pertinent previous records reviewed include none   Follow Up: 1 to 2 weeks for Zilretta injection procedure only visit   Subjective:   I, Moenique Parris, am serving as a Neurosurgeon for Doctor Richardean Sale   Chief Complaint: right  knee pain    HPI:    01/24/2023 Patient is a 67 year old female complaining of knee pain. Patient states pain is occurring in the posterior aspect of the knee as well as the anterior aspect.  She is active and exercise on a regular basis. She has had pain for a month  She felt a twinge in the knee when she was performing Zumba.  She has swelling in her knee, no numbness or tingling    Knee Pain  The incident occurred a two weeks  ago. The incident occurred at the gym. The injury mechanism was a twisting injury. The pain is present in the right knee. The quality of the pain is described as  aching. The pain is severe. The pain has been Constant since onset. Pertinent negatives include no inability to bear weight, loss of motion, loss of sensation, muscle weakness, numbness or tingling. She reports no foreign bodies present. The symptoms are aggravated by movement, palpation and weight bearing. She has tried acetaminophen and NSAIDs for the symptoms. The treatment provided mild relief.  Onset after kick boxing and zumba class.   02/13/2023 Patient states that her knee is a little better, sitting , standing, and walking for a prolonged period is painful , now has medial knee pain , she is frustrated    02/26/2023 Patient states that she is feeling so much better    04/09/2023 Patient states that her right knee is  better just an occasional twinge. Friday night her left knee popped in the back , she had to have help getting up. She has RICEd, she has been improving since Saturday . Her calf and the back of her knee feels tight but that pop had also relieved the pressure she had been feeling   04/30/2023 Patient states right knee is fine . Left knee is killing her    05/14/2023 Patient states tat she is better but, is having some shooting pain down in to her calf that will stop her in her tracks . She has some bruising that appeared Saturday    06/11/2023 Patient states she has intermittent pain. Has a little pain today in the one spot    08/05/2023 Patient states left  knee is killing her.  states it stays swollen and getting up and down is painful     Relevant Historical Information: Parkinson  Additional pertinent review of systems negative.   Current Outpatient Medications:    carbidopa-levodopa (SINEMET IR) 25-100 MG tablet, TAKE ONE TABLET BY MOUTH THREE TIMES A DAY ( 7 IN THE MORNING , 11 IN THE MORNING AND 4 P.M. ), Disp: 270 tablet, Rfl: 2   escitalopram (LEXAPRO) 20 MG tablet, Take 1 tablet (20 mg total) by mouth daily., Disp: 90 tablet, Rfl: 2   pramipexole (MIRAPEX) 0.5 MG  tablet, TAKE ONE TABLET BY MOUTH THREE TIMES A DAY (8:00AM, 1:00PM, AND 6:00PM), Disp: 270 tablet, Rfl: 2   meloxicam (MOBIC) 15 MG tablet, Take 1 tablet (15 mg total) by mouth daily., Disp: 30 tablet, Rfl: 0   Objective:     Vitals:   08/05/23 0918  Pulse: 78  SpO2: 98%  Weight: 216 lb (98 kg)  Height: 5\' 4"  (1.626 m)      Body mass index is 37.08 kg/m.    Physical Exam:    General:  awake, alert oriented, no acute distress nontoxic Skin: no suspicious lesions or rashes Neuro:sensation intact and strength 5/5 with no deficits, no atrophy, normal muscle tone Psych: No signs of anxiety, depression or other mood disorder   Left knee: Mild  swelling No deformity Positive fluid wave, joint milking ROM Flex 100, Ext 0 TTP medial femoral condyle, medial joint line, pes anserine bursa NTTP over the quad tendon,   lat fem condyle, patella, plica, patella tendon, tibial tuberostiy, fibular head, posterior fossa,   gerdy's tubercle,   lateral jt line Neg anterior and posterior drawer Neg lachman Neg sag sign Negative varus stress Negative valgus stress Negative McMurray Negative Thessaly    Electronically signed by:  [name] D.[facility] 12:25 PM 08/05/23

## 2023-08-05 ENCOUNTER — Ambulatory Visit: Payer: Medicare Other | Admitting: Sports Medicine

## 2023-08-05 VITALS — HR 78 | Ht 64.0 in | Wt 216.0 lb

## 2023-08-05 DIAGNOSIS — G8929 Other chronic pain: Secondary | ICD-10-CM | POA: Diagnosis not present

## 2023-08-05 DIAGNOSIS — M25562 Pain in left knee: Secondary | ICD-10-CM | POA: Diagnosis not present

## 2023-08-05 DIAGNOSIS — M1712 Unilateral primary osteoarthritis, left knee: Secondary | ICD-10-CM

## 2023-08-05 MED ORDER — MELOXICAM 15 MG PO TABS: 15.0000 mg | ORAL_TABLET | Freq: Every day | ORAL | 0 refills | Status: DC

## 2023-08-05 NOTE — Patient Instructions (Addendum)
-   Start meloxicam 15 mg daily x2 weeks.  If still having pain after 2 weeks, complete 3rd-week of meloxicam. May use remaining meloxicam as needed once daily for pain control.  Do not to use additional NSAIDs while taking meloxicam.  May use Tylenol [phone] mg 2 to 3 times a day for breakthrough pain. Stationary bike, elliptical, water aerobics We will call you when approved for zilretta

## 2023-08-14 NOTE — Progress Notes (Unsigned)
Sara Nicholson [facility] [address] [place] [address] Phone: [phone]   Assessment and Plan:     There are no diagnoses linked to this encounter.  ***   Pertinent previous records reviewed include ***   Follow Up: ***     Subjective:   I, Sara Nicholson, am serving as a Neurosurgeon for Doctor Richardean Sale   Chief Complaint: right  knee pain    HPI:    01/24/2023 Patient is a 67 year old female complaining of knee pain. Patient states pain is occurring in the posterior aspect of the knee as well as the anterior aspect.  She is active and exercise on a regular basis. She has had pain for a month  She felt a twinge in the knee when she was performing Zumba.  She has swelling in her knee, no numbness or tingling    Knee Pain  The incident occurred a two weeks  ago. The incident occurred at the gym. The injury mechanism was a twisting injury. The pain is present in the right knee. The quality of the pain is described as aching. The pain is severe. The pain has been Constant since onset. Pertinent negatives include no inability to bear weight, loss of motion, loss of sensation, muscle weakness, numbness or tingling. She reports no foreign bodies present. The symptoms are aggravated by movement, palpation and weight bearing. She has tried acetaminophen and NSAIDs for the symptoms. The treatment provided mild relief.  Onset after kick boxing and zumba class.   02/13/2023 Patient states that her knee is a little better, sitting , standing, and walking for a prolonged period is painful , now has medial knee pain , she is frustrated    02/26/2023 Patient states that she is feeling so much better    04/09/2023 Patient states that her right knee is  better just an occasional twinge. Friday night her left knee popped in the back , she had to have help getting up. She has RICEd, she has been improving since Saturday . Her calf and the back of her  knee feels tight but that pop had also relieved the pressure she had been feeling   04/30/2023 Patient states right knee is fine . Left knee is killing her    05/14/2023 Patient states tat she is better but, is having some shooting pain down in to her calf that will stop her in her tracks . She has some bruising that appeared Saturday    06/11/2023 Patient states she has intermittent pain. Has a little pain today in the one spot    08/05/2023 Patient states left knee is killing her.  states it stays swollen and getting up and down is painful    08/15/2023 Patient states   Relevant Historical Information: Parkinson    Additional pertinent review of systems negative.   Current Outpatient Medications:    carbidopa-levodopa (SINEMET IR) 25-100 MG tablet, TAKE ONE TABLET BY MOUTH THREE TIMES A DAY ( 7 IN THE MORNING , 11 IN THE MORNING AND 4 P.M. ), Disp: 270 tablet, Rfl: 2   escitalopram (LEXAPRO) 20 MG tablet, Take 1 tablet (20 mg total) by mouth daily., Disp: 90 tablet, Rfl: 2   meloxicam (MOBIC) 15 MG tablet, Take 1 tablet (15 mg total) by mouth daily., Disp: 30 tablet, Rfl: 0   pramipexole (MIRAPEX) 0.5 MG tablet, TAKE ONE TABLET BY MOUTH THREE TIMES A DAY (8:00AM, 1:00PM, AND 6:00PM), Disp: 270  tablet, Rfl: 2   Objective:     There were no vitals filed for this visit.    There is no height or weight on file to calculate BMI.    Physical Exam:    ***   Electronically signed by:  Sara Nicholson [facility] 7:27 AM 08/14/23

## 2023-08-15 ENCOUNTER — Ambulatory Visit: Payer: Medicare Other | Admitting: Sports Medicine

## 2023-08-15 DIAGNOSIS — M1712 Unilateral primary osteoarthritis, left knee: Secondary | ICD-10-CM

## 2023-08-15 DIAGNOSIS — M25562 Pain in left knee: Secondary | ICD-10-CM

## 2023-08-15 DIAGNOSIS — G8929 Other chronic pain: Secondary | ICD-10-CM

## 2023-08-15 MED ORDER — TRIAMCINOLONE ACETONIDE 32 MG IX SRER
32.0000 mg | Freq: Once | INTRA_ARTICULAR | Status: AC
Start: 2023-08-15 — End: 2023-08-15
  Administered 2023-08-15: 32 mg via INTRA_ARTICULAR

## 2023-08-20 ENCOUNTER — Encounter: Payer: Self-pay | Admitting: Sports Medicine

## 2023-08-21 ENCOUNTER — Ambulatory Visit: Payer: Medicare Other | Admitting: Sports Medicine

## 2023-08-21 VITALS — HR 64 | Ht 64.0 in | Wt 216.0 lb

## 2023-08-21 DIAGNOSIS — G8929 Other chronic pain: Secondary | ICD-10-CM

## 2023-08-21 DIAGNOSIS — M1711 Unilateral primary osteoarthritis, right knee: Secondary | ICD-10-CM

## 2023-08-21 DIAGNOSIS — M25561 Pain in right knee: Secondary | ICD-10-CM

## 2023-08-21 NOTE — Progress Notes (Signed)
[name] D.[facility] [address] [place] [address] Phone: [phone]   Assessment and Plan:     1. Primary osteoarthritis of right knee 2. Chronic pain of right knee  -Chronic with exacerbation, subsequent sports medicine visit - Recurrent right knee pain consistent with flare of osteoarthritis.  Patient additionally had nondepressed, nondisplaced subchondral insufficiency fracture of the medial femoral condyle and high-grade cartilage loss in medial compartment on prior MRI imaging.  Patient has had improvement in the past with NSAID course and CSI.  Elects for repeat CSI at today's visit.  Tolerated well per note below -Continue Tylenol 500 to 1000 mg tablets 2-3 times a day for day-to-day pain relief  - May continue meloxicam 15 mg daily as needed for breakthrough pain.  Recommend limiting chronic NSAID use to 1-2 times per week - Continue HEP.  Relative rest for 1 week with gradual return to activities  Procedure: Knee Joint Injection Side: Right Indication: Flare of osteoarthritis  Risks explained and consent was given verbally. The site was cleaned with alcohol prep. A needle was introduced with an anterio-lateral approach. Injection given using 2mL of 1% lidocaine without epinephrine and 1mL of kenalog 40mg /ml. This was well tolerated and resulted in symptomatic relief.  Needle was removed, hemostasis achieved, and post injection instructions were explained.   Pt was advised to call or return to clinic if these symptoms worsen or fail to improve as anticipated.   Pertinent previous records reviewed include none  Follow Up: As needed if no improvement or worsening of symptoms.  Could consider transitioning to Zilretta injection which has been significantly beneficial for patient's left knee pain   Subjective:   I, Moenique Parris, am serving as a Neurosurgeon for Doctor Richardean Sale   Chief Complaint: right  knee pain    HPI:     01/24/2023 Patient is a 67 year old female complaining of knee pain. Patient states pain is occurring in the posterior aspect of the knee as well as the anterior aspect.  She is active and exercise on a regular basis. She has had pain for a month  She felt a twinge in the knee when she was performing Zumba.  She has swelling in her knee, no numbness or tingling    Knee Pain  The incident occurred a two weeks  ago. The incident occurred at the gym. The injury mechanism was a twisting injury. The pain is present in the right knee. The quality of the pain is described as aching. The pain is severe. The pain has been Constant since onset. Pertinent negatives include no inability to bear weight, loss of motion, loss of sensation, muscle weakness, numbness or tingling. She reports no foreign bodies present. The symptoms are aggravated by movement, palpation and weight bearing. She has tried acetaminophen and NSAIDs for the symptoms. The treatment provided mild relief.  Onset after kick boxing and zumba class.   02/13/2023 Patient states that her knee is a little better, sitting , standing, and walking for a prolonged period is painful , now has medial knee pain , she is frustrated    02/26/2023 Patient states that she is feeling so much better    04/09/2023 Patient states that her right knee is  better just an occasional twinge. Friday night her left knee popped in the back , she had to have help getting up. She has RICEd, she has been improving since Saturday . Her calf and the back  of her knee feels tight but that pop had also relieved the pressure she had been feeling   04/30/2023 Patient states right knee is fine . Left knee is killing her    05/14/2023 Patient states tat she is better but, is having some shooting pain down in to her calf that will stop her in her tracks . She has some bruising that appeared Saturday    06/11/2023 Patient states she has intermittent pain. Has a little pain today in the  one spot    08/05/2023 Patient states left knee is killing her.  states it stays swollen and getting up and down is painful    08/15/2023 Patient states  08/21/2023 Patient states here for CSI    Relevant Historical Information: Parkinson  Additional pertinent review of systems negative.   Current Outpatient Medications:    carbidopa-levodopa (SINEMET IR) 25-100 MG tablet, TAKE ONE TABLET BY MOUTH THREE TIMES A DAY ( 7 IN THE MORNING , 11 IN THE MORNING AND 4 P.M. ), Disp: 270 tablet, Rfl: 2   escitalopram (LEXAPRO) 20 MG tablet, Take 1 tablet (20 mg total) by mouth daily., Disp: 90 tablet, Rfl: 2   meloxicam (MOBIC) 15 MG tablet, Take 1 tablet (15 mg total) by mouth daily., Disp: 30 tablet, Rfl: 0   pramipexole (MIRAPEX) 0.5 MG tablet, TAKE ONE TABLET BY MOUTH THREE TIMES A DAY (8:00AM, 1:00PM, AND 6:00PM), Disp: 270 tablet, Rfl: 2   Objective:     Vitals:   08/21/23 0828  Pulse: 64  SpO2: 100%  Weight: 216 lb (98 kg)  Height: 5\' 4"  (1.626 m)      Body mass index is 37.08 kg/m.    Physical Exam:    General:  awake, alert oriented, no acute distress nontoxic Skin: no suspicious lesions or rashes Neuro:sensation intact and strength 5/5 with no deficits, no atrophy, normal muscle tone Psych: No signs of anxiety, depression or other mood disorder   Right knee: No swelling No deformity Neg fluid wave, joint milking ROM Flex 110, Ext 0 TTP medial joint line NTTP over the quad tendon, medial fem condyle, lat fem condyle, patella, plica, patella tendon, tibial tuberostiy, fibular head, posterior fossa, pes anserine bursa, gerdy's tubercle,   lateral jt line Neg anterior and posterior drawer Neg lachman Neg sag sign Negative varus stress Negative valgus stress Negative McMurray Positive Thessaly   Gait normal     Electronically signed by:  [name] [facility] 8:44 AM 08/21/23

## 2023-08-27 DIAGNOSIS — D2271 Melanocytic nevi of right lower limb, including hip: Secondary | ICD-10-CM | POA: Diagnosis not present

## 2023-08-27 DIAGNOSIS — D2239 Melanocytic nevi of other parts of face: Secondary | ICD-10-CM | POA: Diagnosis not present

## 2023-08-27 DIAGNOSIS — D692 Other nonthrombocytopenic purpura: Secondary | ICD-10-CM | POA: Diagnosis not present

## 2023-08-27 DIAGNOSIS — Z85820 Personal history of malignant melanoma of skin: Secondary | ICD-10-CM | POA: Diagnosis not present

## 2023-08-27 DIAGNOSIS — D2272 Melanocytic nevi of left lower limb, including hip: Secondary | ICD-10-CM | POA: Diagnosis not present

## 2023-08-27 DIAGNOSIS — D225 Melanocytic nevi of trunk: Secondary | ICD-10-CM | POA: Diagnosis not present

## 2023-08-27 DIAGNOSIS — L814 Other melanin hyperpigmentation: Secondary | ICD-10-CM | POA: Diagnosis not present

## 2023-08-27 DIAGNOSIS — L821 Other seborrheic keratosis: Secondary | ICD-10-CM | POA: Diagnosis not present

## 2023-08-27 DIAGNOSIS — Z85828 Personal history of other malignant neoplasm of skin: Secondary | ICD-10-CM | POA: Diagnosis not present

## 2023-09-13 IMAGING — MG MM DIGITAL SCREENING BILAT W/ TOMO AND CAD
8 series · 8 of 24 positions shown · non-contrast
Comparison: Previous exam(s).

CLINICAL DATA: Screening.

EXAM:
DIGITAL SCREENING BILATERAL MAMMOGRAM WITH TOMOSYNTHESIS AND CAD
TECHNIQUE: Bilateral screening digital craniocaudal and mediolateral oblique
mammograms were obtained. Bilateral screening digital breast
tomosynthesis was performed. The images were evaluated with
computer-aided detection.

[L CC synth-2D]
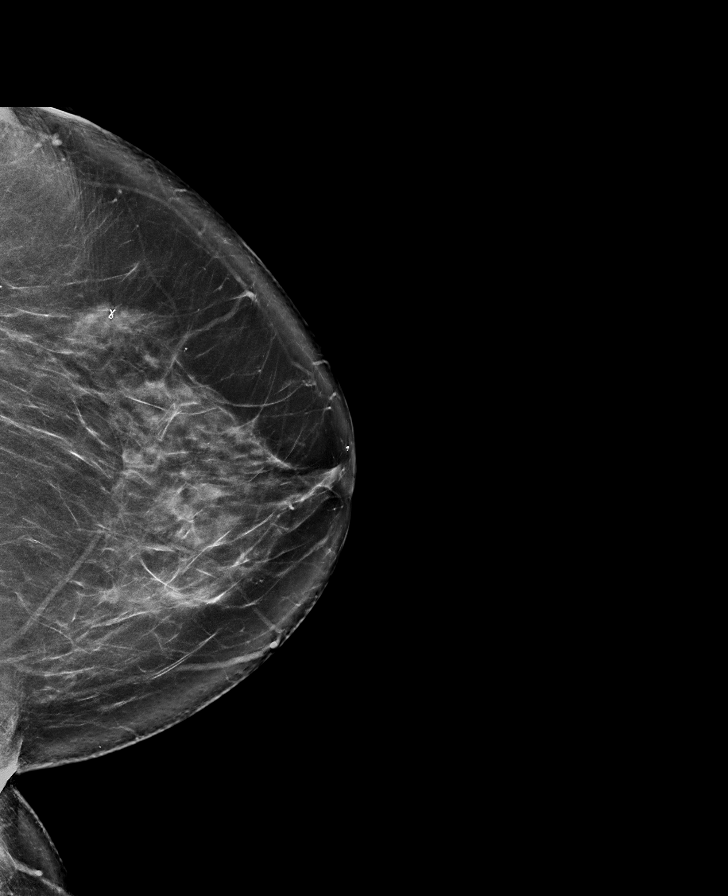

[R MLO synth-2D]
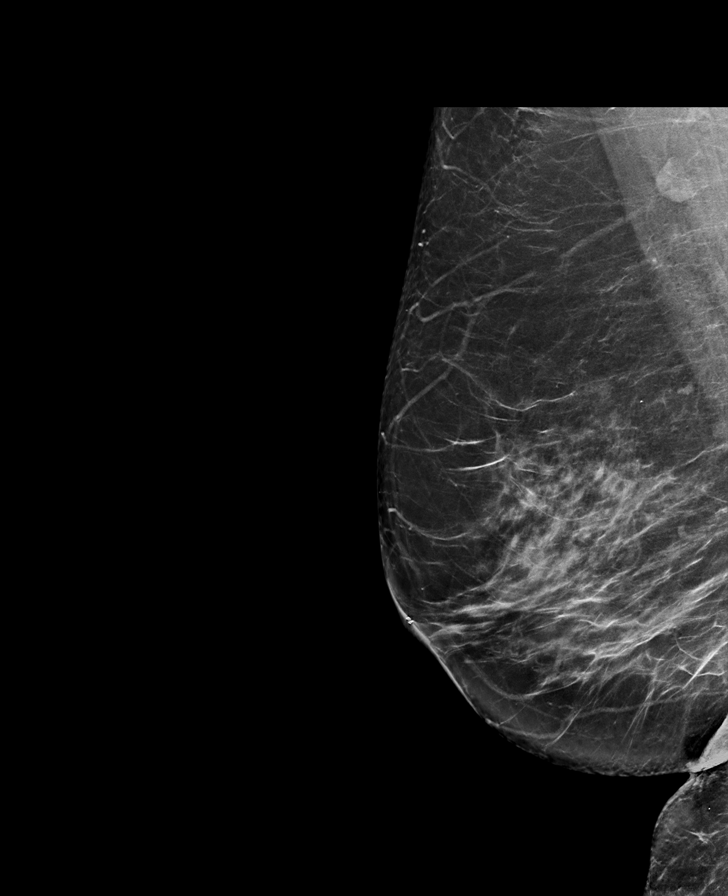

[L MLO synth-2D]
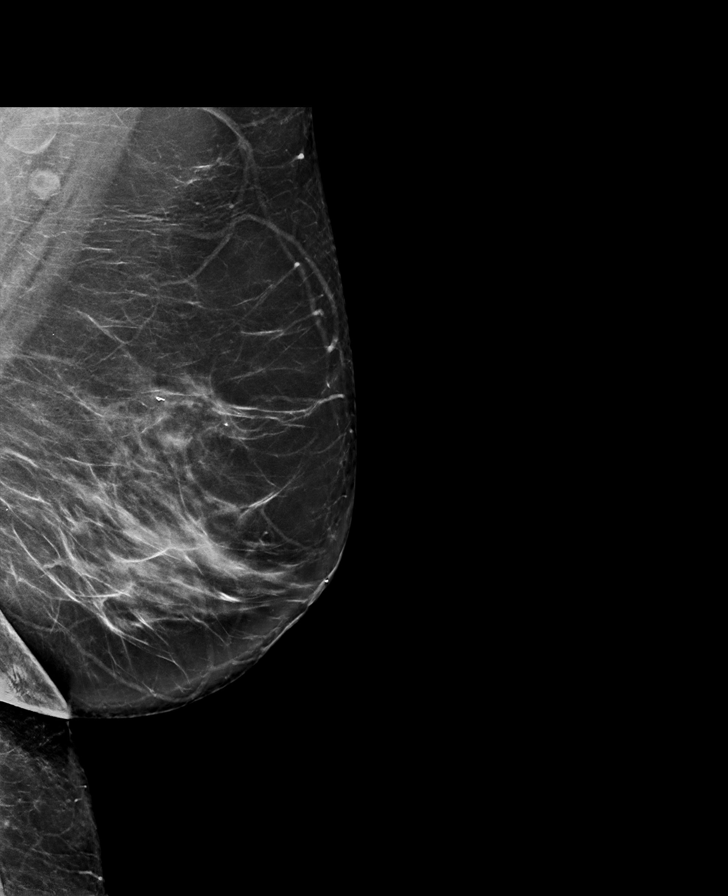

[R CC synth-2D]
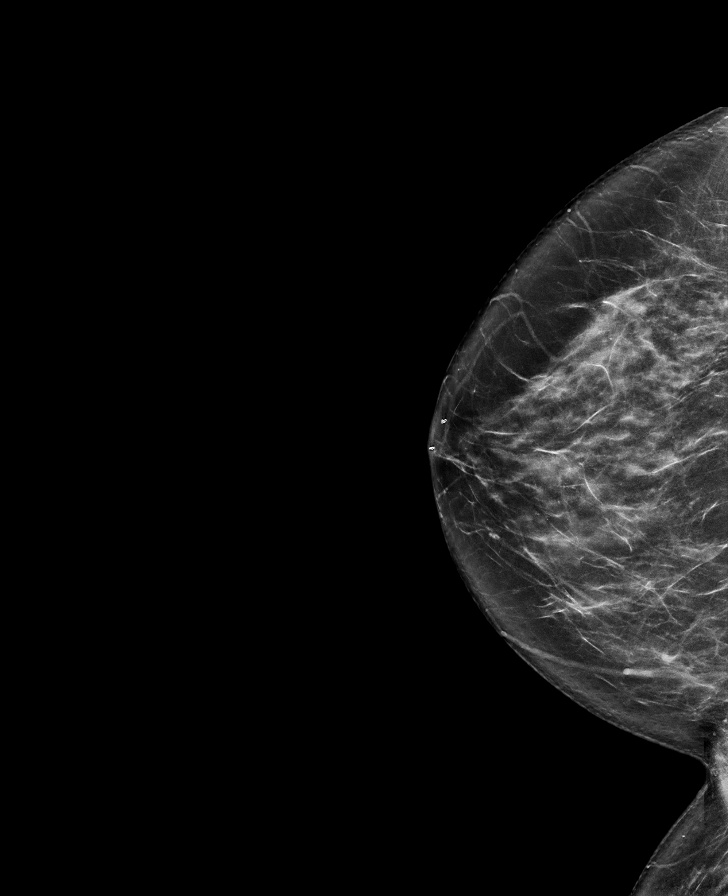

[R MLO tomo · tomo slice 42/83.0]
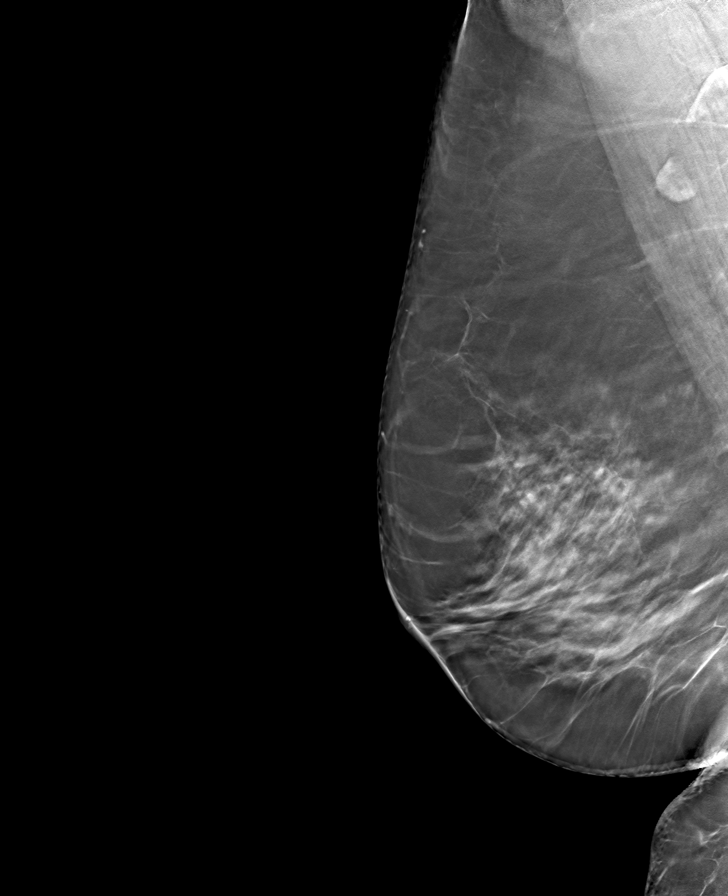

[R CC tomo · tomo slice 37/74.0]
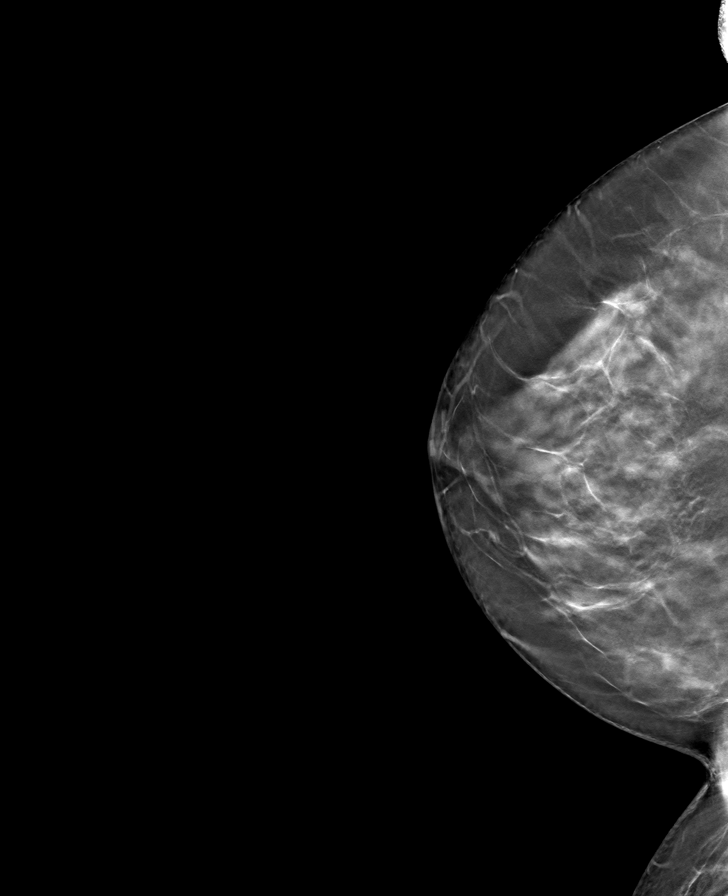

[L CC tomo · tomo slice 43/86.0]
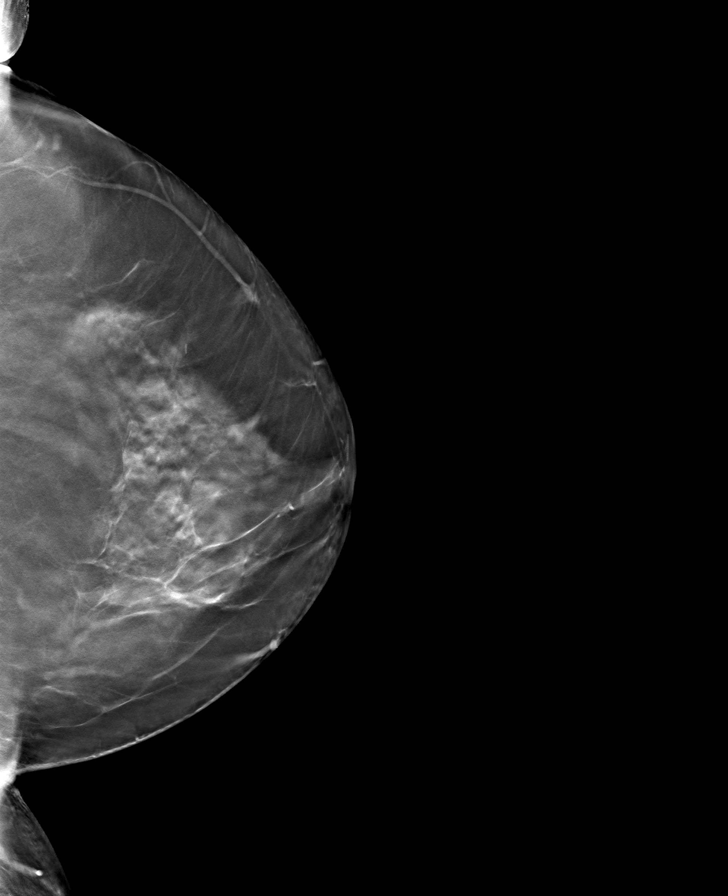

[L MLO tomo · tomo slice 44/87.0]
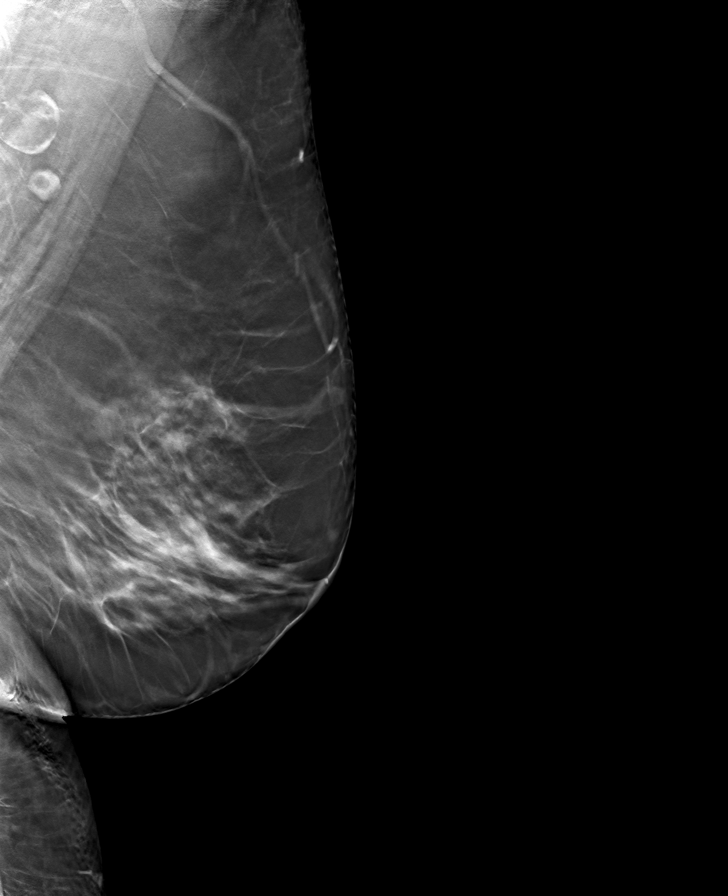

[8 of 24 positions shown; findings below may reference images not displayed]

ACR Breast Density Category c: The breast tissue is heterogeneously
dense, which may obscure small masses.
FINDINGS: There are no findings suspicious for malignancy.
IMPRESSION: No mammographic evidence of malignancy. A result letter of this
screening mammogram will be mailed directly to the patient.

RECOMMENDATION:
Screening mammogram in one year. (Code:Q3-W-BC3)

BI-RADS CATEGORY  1: Negative.

## 2023-09-23 ENCOUNTER — Encounter: Payer: Self-pay | Admitting: Nurse Practitioner

## 2023-09-23 ENCOUNTER — Ambulatory Visit (INDEPENDENT_AMBULATORY_CARE_PROVIDER_SITE_OTHER): Payer: Medicare Other

## 2023-09-23 ENCOUNTER — Ambulatory Visit: Payer: Medicare Other | Admitting: Nurse Practitioner

## 2023-09-23 VITALS — BP 138/78 | HR 71 | Temp 98.2°F | Resp 18 | Wt 224.0 lb

## 2023-09-23 DIAGNOSIS — J069 Acute upper respiratory infection, unspecified: Secondary | ICD-10-CM

## 2023-09-23 DIAGNOSIS — Z0000 Encounter for general adult medical examination without abnormal findings: Secondary | ICD-10-CM | POA: Diagnosis not present

## 2023-09-23 LAB — POC COVID19 BINAXNOW: SARS Coronavirus 2 Ag: NEGATIVE

## 2023-09-23 LAB — POCT INFLUENZA A/B
Influenza A, POC: NEGATIVE
Influenza B, POC: NEGATIVE

## 2023-09-23 MED ORDER — BENZONATATE 100 MG PO CAPS
100.0000 mg | ORAL_CAPSULE | Freq: Three times a day (TID) | ORAL | 0 refills | Status: DC | PRN
Start: 2023-09-23 — End: 2023-10-17

## 2023-09-23 NOTE — Patient Instructions (Signed)
URI Instructions: Continue use of flonase nasal spray Avoid decongestants if you have high blood pressure. Encourage adequate oral hydration. Use over-the-counter Coricidin HBP for cough and congestion.  Use mucinex DM or Robitussin  or delsym for cough without sinus congestion  You can use plain "Tylenol" or "Advil" for fever, chills and achyness. Use cool mist humidifier at bedtime to help with nasal congestion and cough.  Cold/cough medications may have tylenol or ibuprofen or guaifenesin or dextromethophan in them, so be careful not to take beyond the recommended dose for each of these medications.   "Common cold" symptoms are usually triggered by a virus.  The antibiotics are usually not necessary. On average, a" viral cold" illness may take 7-10 days to resolve. Please, make an appointment if you are not better or if you're worse.

## 2023-09-23 NOTE — Progress Notes (Signed)
Subjective:   Sara Nicholson is a 67 y.o. female who presents for Medicare Annual (Subsequent) preventive examination.  Visit Complete: Virtual I connected with  Sara Nicholson on 09/23/23 by a audio enabled telemedicine application and verified that I am speaking with the correct person using two identifiers.  Patient Location: Home  Provider Location: Office/Clinic  I discussed the limitations of evaluation and management by telemedicine. The patient expressed understanding and agreed to proceed.  Vital Signs: Because this visit was a virtual/telehealth visit, some criteria may be missing or patient reported. Any vitals not documented were not able to be obtained and vitals that have been documented are patient reported.  Patient Medicare AWV questionnaire was completed by the patient on 09/16/2023; I have confirmed that all information answered by patient is correct and no changes since this date.  Cardiac Risk Factors include: advanced age (>60men, >29 women)     Objective:    Today's Vitals   There is no height or weight on file to calculate BMI.     09/23/2023    8:14 AM 04/12/2023    8:30 AM 10/11/2022    8:39 AM 09/17/2022    8:15 AM 04/05/2022   10:44 AM 10/31/2021    8:41 AM 09/05/2021   10:41 AM  Advanced Directives  Does Patient Have a Medical Advance Directive? Yes Yes Yes Yes Yes Yes Yes  Type of Estate agent of [place];Living will Living will Living will Healthcare Power of [place];Living will Healthcare Power of [place];Living will;Out of facility DNR (pink MOST or yellow form) Living will Healthcare Power of [place];Living will  Copy of Healthcare Power of Attorney in Chart? No - copy requested   No - copy requested   No - copy requested    Current Medications (verified) Outpatient Encounter Medications as of 09/23/2023  Medication Sig   carbidopa-levodopa (SINEMET IR) 25-100 MG tablet TAKE ONE TABLET BY MOUTH THREE TIMES A DAY ( 7 IN  THE MORNING , 11 IN THE MORNING AND 4 P.M. )   escitalopram (LEXAPRO) 20 MG tablet Take 1 tablet (20 mg total) by mouth daily.   meloxicam (MOBIC) 15 MG tablet Take 1 tablet (15 mg total) by mouth daily.   pramipexole (MIRAPEX) 0.5 MG tablet TAKE ONE TABLET BY MOUTH THREE TIMES A DAY (8:00AM, 1:00PM, AND 6:00PM)   No facility-administered encounter medications on file as of 09/23/2023.    Allergies (verified) Terbinafine hcl   History: Past Medical History:  Diagnosis Date   Depression    Parkinson disease (HCC)    Past Surgical History:  Procedure Laterality Date   APPENDECTOMY     BREAST BIOPSY Left    BREAST EXCISIONAL BIOPSY Left    BREAST SURGERY     CESAREAN SECTION     COLONOSCOPY     Family History  Problem Relation Age of Onset   Heart attack Father    Heart attack Brother    Cancer Brother        Mass on L5   Diabetes Maternal Grandmother    Heart attack Maternal Grandfather    Heart attack Paternal Grandfather    Cancer Brother    Lung cancer Brother    Cancer - Lung Brother        mets to esophagus and brain   Neurologic Disorder Sister 58       mild cognitive disorder   Neuromuscular disorder Mother 62       unknown diagnosis  Colon cancer Neg Hx    Colon polyps Neg Hx    Stomach cancer Neg Hx    Rectal cancer Neg Hx    Social History   Socioeconomic History   Marital status: Married    Spouse name: Not on file   Number of children: Not on file   Years of education: Not on file   Highest education level: Associate degree: occupational, Scientist, product/process development, or vocational program  Occupational History   Not on file  Tobacco Use   Smoking status: Never   Smokeless tobacco: Never  Vaping Use   Vaping status: Never Used  Substance and Sexual Activity   Alcohol use: No   Drug use: No   Sexual activity: Yes    Partners: Male    Birth control/protection: Post-menopausal  Other Topics Concern   Not on file  Social History Narrative   Right Handed    Lives in a one story with basement   Drinks Caffeine    Social Drivers of Health   Financial Resource Strain: Low Risk  (09/16/2023)   Overall Financial Resource Strain (CARDIA)    Difficulty of Paying Living Expenses: Not hard at all  Food Insecurity: No Food Insecurity (09/16/2023)   Hunger Vital Sign    Worried About Running Out of Food in the Last Year: Never true    Ran Out of Food in the Last Year: Never true  Transportation Needs: No Transportation Needs (09/16/2023)   PRAPARE - Administrator, Civil Service (Medical): No    Lack of Transportation (Non-Medical): No  Physical Activity: Sufficiently Active (09/16/2023)   Exercise Vital Sign    Days of Exercise per Week: 7 days    Minutes of Exercise per Session: 30 min  Stress: No Stress Concern Present (09/16/2023)   [employer] of Occupational Health - Occupational Stress Questionnaire    Feeling of Stress : Only a little  Social Connections: Socially Integrated (09/16/2023)   Social Connection and Isolation Panel [NHANES]    Frequency of Communication with Friends and Family: More than three times a week    Frequency of Social Gatherings with Friends and Family: Twice a week    Attends Religious Services: More than 4 times per year    Active Member of [employer] or Organizations: No    Attends Engineer, structural: 1 to 4 times per year    Marital Status: Married    Tobacco Counseling Counseling given: Not Answered   Clinical Intake:  Pre-visit preparation completed: Yes  Pain : No/denies pain     Nutritional Risks: None Diabetes: No  How often do you need to have someone help you when you read instructions, pamphlets, or other written materials from your doctor or pharmacy?: 1 - Never  Interpreter Needed?: No  Information entered by :: NAllen LPN   Activities of Daily Living    09/16/2023   10:18 AM  In your present state of health, do you have any difficulty performing the following  activities:  Hearing? 0  Vision? 0  Difficulty concentrating or making decisions? 0  Walking or climbing stairs? 0  Dressing or bathing? 0  Doing errands, shopping? 0  Preparing Food and eating ? N  Using the Toilet? N  In the past six months, have you accidently leaked urine? N  Do you have problems with loss of bowel control? N  Managing your Medications? N  Managing your Finances? N  Housekeeping or managing your Housekeeping? N  Patient Care Team: Nche, Bonna Gains, NP as PCP - General (Internal Medicine) Romualdo Bolk, MD (Inactive) as Consulting Physician (Obstetrics and Gynecology) Tat, Octaviano Batty, DO as Consulting Physician (Neurology)  Indicate any recent Medical Services you may have received from other than [facility] providers in the past year (date may be approximate).     Assessment:   This is a routine wellness examination for [place].  Hearing/Vision screen Hearing Screening - Comments:: Denies hearing issues Vision Screening - Comments:: Regular eye exams, [facility]   Goals Addressed             This Visit's Progress    Patient Stated       09/23/2023, wants to lose weight       Depression Screen    09/23/2023    8:15 AM 01/15/2023   11:34 AM 11/08/2022    9:01 AM 09/17/2022    8:15 AM 08/07/2022   11:40 AM 09/05/2021   10:39 AM 03/16/2020    2:10 PM  PHQ 2/9 Scores  PHQ - 2 Score 0 0 0 0 0 0 0  PHQ- 9 Score 0 1   2  0    Fall Risk    09/16/2023   10:18 AM 04/12/2023    8:30 AM 01/15/2023   10:49 AM 11/08/2022    9:01 AM 10/11/2022    8:39 AM  Fall Risk   Falls in the past year? 0 0 0 0 0  Number falls in past yr: 0 0 0 0 0  Injury with Fall? 0 0 0 0 0  Risk for fall due to : Medication side effect  No Fall Risks    Follow up Falls prevention discussed;Falls evaluation completed Falls evaluation completed Falls evaluation completed  Falls evaluation completed    MEDICARE RISK AT HOME: Medicare Risk at Home Any stairs in or around the  home?: Yes If so, are there any without handrails?: Yes Home free of loose throw rugs in walkways, pet beds, electrical cords, etc?: Yes Adequate lighting in your home to reduce risk of falls?: Yes Life alert?: No Use of a cane, walker or w/c?: No Grab bars in the bathroom?: No Shower chair or bench in shower?: Yes Elevated toilet seat or a handicapped toilet?: Yes  TIMED UP AND GO:  Was the test performed?  No    Cognitive Function:        09/23/2023    8:16 AM 09/17/2022    8:16 AM  6CIT Screen  What Year? 0 points 0 points  What month? 0 points 0 points  What time? 0 points 0 points  Count back from 20 0 points 0 points  Months in reverse 0 points 0 points  Repeat phrase 0 points 2 points  Total Score 0 points 2 points    Immunizations Immunization History  Administered Date(s) Administered   DTaP 03/02/2009   Fluad Quad(high Dose 65+) 09/09/2023   Influenza, High Dose Seasonal PF 07/27/2022   Influenza-Unspecified 07/16/2014, 07/12/2015, 07/18/2016, 08/11/2019, 07/02/2020, 07/02/2021   Moderna Sars-Covid-2 Vaccination 12/31/2019, 01/28/2020, 09/17/2020, 04/30/2021   Td 10/08/1996, 03/02/2009   Tdap 03/21/2018   Zoster Recombinant(Shingrix) 12/01/2022, 02/05/2023    TDAP status: Up to date  Flu Vaccine status: Up to date  Pneumococcal vaccine status: Due, Education has been provided regarding the importance of this vaccine. Advised may receive this vaccine at local pharmacy or Health Dept. Aware to provide a copy of the vaccination record if obtained from local pharmacy  or Health Dept. Verbalized acceptance and understanding.  Covid-19 vaccine status: Information provided on how to obtain vaccines.   Qualifies for Shingles Vaccine? Yes   Zostavax completed No   Shingrix Completed?: Yes  Screening Tests Health Maintenance  Topic Date Due   Hepatitis C Screening  Never done   COVID-19 Vaccine (5 - 2024-25 season) 10/09/2023 (Originally 06/09/2023)    Pneumonia Vaccine 35+ Years old (1 of 1 - PCV) 11/09/2023 (Originally 02/06/2021)   MAMMOGRAM  12/18/2023   Medicare Annual Wellness (AWV)  09/22/2024   DTaP/Tdap/Td (5 - Td or Tdap) 03/21/2028   Colonoscopy  06/02/2028   INFLUENZA VACCINE  Completed   DEXA SCAN  Completed   Zoster Vaccines- Shingrix  Completed   HPV VACCINES  Aged Out    Health Maintenance  Health Maintenance Due  Topic Date Due   Hepatitis C Screening  Never done    Colorectal cancer screening: Type of screening: Colonoscopy. Completed 06/02/2018. Repeat every 10 years  Mammogram status: Completed 12/18/2022. Repeat every year  Bone Density status: Completed 03/22/2022.   Lung Cancer Screening: (Low Dose CT Chest recommended if Age 66-80 years, 20 pack-year currently smoking OR have quit w/in 15years.) does not qualify.   Lung Cancer Screening Referral: no  Additional Screening:  Hepatitis C Screening: does qualify;  Vision Screening: Recommended annual ophthalmology exams for early detection of glaucoma and other disorders of the eye. Is the patient up to date with their annual eye exam?  Yes  Who is the provider or what is the name of the office in which the patient attends annual eye exams? [facility] If pt is not established with a provider, would they like to be referred to a provider to establish care? No .   Dental Screening: Recommended annual dental exams for proper oral hygiene  Diabetic Foot Exam: n/a  Community Resource Referral / Chronic Care Management: CRR required this visit?  No   CCM required this visit?  No     Plan:     I have personally reviewed and noted the following in the patient's chart:   Medical and social history Use of alcohol, tobacco or illicit drugs  Current medications and supplements including opioid prescriptions. Patient is not currently taking opioid prescriptions. Functional ability and status Nutritional status Physical activity Advanced  directives List of other physicians Hospitalizations, surgeries, and ER visits in previous 12 months Vitals Screenings to include cognitive, depression, and falls Referrals and appointments  In addition, I have reviewed and discussed with patient certain preventive protocols, quality metrics, and best practice recommendations. A written personalized care plan for preventive services as well as general preventive health recommendations were provided to patient.     Barb Merino, LPN   19/14/7829   After Visit Summary: (MyChart) Due to this being a telephonic visit, the after visit summary with patients personalized plan was offered to patient via MyChart   Nurse Notes: none

## 2023-09-23 NOTE — Progress Notes (Signed)
Established Patient Visit  Patient: Sara Nicholson   DOB: 1955-12-05   67 y.o. Female  MRN: [id number] Visit Date: 09/23/2023  Subjective:    Chief Complaint  Patient presents with   Acute Visit    PT C/O of cough, sinus pressure, congestion since Thursday. PT took OTC Tylenol, nasal spray and cough drops for symptoms but it hasn't helped much    URI  This is a new problem. The current episode started in the past 7 days. The problem has been unchanged. There has been no fever. Associated symptoms include congestion, coughing, headaches, joint pain, a plugged ear sensation, rhinorrhea, sinus pain and a sore throat. Pertinent negatives include no abdominal pain, chest pain, diarrhea, dysuria, ear pain, joint swelling, nausea, neck pain, rash, sneezing, swollen glands, vomiting or wheezing. She has tried acetaminophen and antihistamine for the symptoms. The treatment provided no relief.   BP Readings from Last 3 Encounters:  09/23/23 138/78  06/11/23 (!) 140/82  05/14/23 122/68    Reviewed medical, surgical, and social history today  Medications: Outpatient Medications Prior to Visit  Medication Sig   carbidopa-levodopa (SINEMET IR) 25-100 MG tablet TAKE ONE TABLET BY MOUTH THREE TIMES A DAY ( 7 IN THE MORNING , 11 IN THE MORNING AND 4 P.M. )   escitalopram (LEXAPRO) 20 MG tablet Take 1 tablet (20 mg total) by mouth daily.   meloxicam (MOBIC) 15 MG tablet Take 1 tablet (15 mg total) by mouth daily.   pramipexole (MIRAPEX) 0.5 MG tablet TAKE ONE TABLET BY MOUTH THREE TIMES A DAY (8:00AM, 1:00PM, AND 6:00PM)   No facility-administered medications prior to visit.   Reviewed past medical and social history.   ROS per HPI above      Objective:  BP 138/78 (Cuff Size: Large)   Pulse 71   Temp 98.2 F (36.8 C) (Oral)   Resp 18   Wt 224 lb (101.6 kg)   LMP 06/08/2010   SpO2 98%   BMI 38.45 kg/m      Physical Exam Vitals and nursing note reviewed.   Constitutional:      General: She is not in acute distress. HENT:     Right Ear: Tympanic membrane, ear canal and external ear normal.     Left Ear: Tympanic membrane, ear canal and external ear normal.     Nose: Congestion present. No nasal tenderness, mucosal edema or rhinorrhea.     Right Nostril: No occlusion.     Left Nostril: No occlusion.     Right Turbinates: Not enlarged, swollen or pale.     Left Turbinates: Not enlarged, swollen or pale.     Right Sinus: No maxillary sinus tenderness or frontal sinus tenderness.     Left Sinus: No maxillary sinus tenderness or frontal sinus tenderness.     Mouth/Throat:     Pharynx: Oropharynx is clear. Uvula midline.     Tonsils: No tonsillar exudate or tonsillar abscesses.  Eyes:     Extraocular Movements: Extraocular movements intact.     Conjunctiva/sclera: Conjunctivae normal.  Cardiovascular:     Rate and Rhythm: Normal rate and regular rhythm.     Pulses: Normal pulses.     Heart sounds: Normal heart sounds.  Pulmonary:     Effort: Pulmonary effort is normal.     Breath sounds: Normal breath sounds.  Musculoskeletal:     Cervical back: Normal range of motion and  neck supple.  Lymphadenopathy:     Cervical: No cervical adenopathy.  Neurological:     Mental Status: She is alert and oriented to person, place, and time.     No results found for any visits on 09/23/23.    Assessment & Plan:    Problem List Items Addressed This Visit   None Visit Diagnoses       Viral upper respiratory tract infection    -  Primary   Relevant Medications   benzonatate (TESSALON) 100 MG capsule   Other Relevant Orders   POCT Influenza A/B   POC COVID-19      Return if symptoms worsen or fail to improve.     Alysia Penna, NP

## 2023-09-23 NOTE — Patient Instructions (Signed)
Sara Nicholson , Thank you for taking time to come for your Medicare Wellness Visit. I appreciate your ongoing commitment to your health goals. Please review the following plan we discussed and let me know if I can assist you in the future.   Referrals/Orders/Follow-Ups/Clinician Recommendations: none  This is a list of the screening recommended for you and due dates:  Health Maintenance  Topic Date Due   Hepatitis C Screening  Never done   COVID-19 Vaccine (5 - 2024-25 season) 10/09/2023*   Pneumonia Vaccine (1 of 1 - PCV) 11/09/2023*   Mammogram  12/18/2023   Medicare Annual Wellness Visit  09/22/2024   DTaP/Tdap/Td vaccine (5 - Td or Tdap) 03/21/2028   Colon Cancer Screening  06/02/2028   Flu Shot  Completed   DEXA scan (bone density measurement)  Completed   Zoster (Shingles) Vaccine  Completed   HPV Vaccine  Aged Out  *Topic was postponed. The date shown is not the original due date.    Advanced directives: (Copy Requested) Please bring a copy of your health care power of attorney and living will to the office to be added to your chart at your convenience.  Next Medicare Annual Wellness Visit scheduled for next year: Yes  Insert Preventive Care attachment Insert FALL PREVENTION attachment if needed

## 2023-09-25 NOTE — Progress Notes (Unsigned)
Sara Nicholson D[facility] [address] [place] [address] Phone: [phone]   Assessment and Plan:     There are no diagnoses linked to this encounter.  ***   Pertinent previous records reviewed include ***    Follow Up: ***     Subjective:   I, Sonya Gunnoe, am serving as a Neurosurgeon for Doctor Richardean Sale   Chief Complaint: right  knee pain    HPI:    01/24/2023 Patient is a 67 year old female complaining of knee pain. Patient states pain is occurring in the posterior aspect of the knee as well as the anterior aspect.  She is active and exercise on a regular basis. She has had pain for a month  She felt a twinge in the knee when she was performing Zumba.  She has swelling in her knee, no numbness or tingling    Knee Pain  The incident occurred a two weeks  ago. The incident occurred at the gym. The injury mechanism was a twisting injury. The pain is present in the right knee. The quality of the pain is described as aching. The pain is severe. The pain has been Constant since onset. Pertinent negatives include no inability to bear weight, loss of motion, loss of sensation, muscle weakness, numbness or tingling. She reports no foreign bodies present. The symptoms are aggravated by movement, palpation and weight bearing. She has tried acetaminophen and NSAIDs for the symptoms. The treatment provided mild relief.  Onset after kick boxing and zumba class.   02/13/2023 Patient states that her knee is a little better, sitting , standing, and walking for a prolonged period is painful , now has medial knee pain , she is frustrated    02/26/2023 Patient states that she is feeling so much better    04/09/2023 Patient states that her right knee is  better just an occasional twinge. Friday night her left knee popped in the back , she had to have help getting up. She has RICEd, she has been improving since Saturday . Her calf and the back of her  knee feels tight but that pop had also relieved the pressure she had been feeling   04/30/2023 Patient states right knee is fine . Left knee is killing her    05/14/2023 Patient states tat she is better but, is having some shooting pain down in to her calf that will stop her in her tracks . She has some bruising that appeared Saturday    06/11/2023 Patient states she has intermittent pain. Has a little pain today in the one spot    08/05/2023 Patient states left knee is killing her.  states it stays swollen and getting up and down is painful    08/15/2023 Patient states   08/21/2023 Patient states here for CSI   09/26/2023 Patient states   Relevant Historical Information: Parkinson   Additional pertinent review of systems negative.   Current Outpatient Medications:    benzonatate (TESSALON) 100 MG capsule, Take 1-2 capsules (100-200 mg total) by mouth 3 (three) times daily as needed., Disp: 21 capsule, Rfl: 0   carbidopa-levodopa (SINEMET IR) 25-100 MG tablet, TAKE ONE TABLET BY MOUTH THREE TIMES A DAY ( 7 IN THE MORNING , 11 IN THE MORNING AND 4 P.M. ), Disp: 270 tablet, Rfl: 2   escitalopram (LEXAPRO) 20 MG tablet, Take 1 tablet (20 mg total) by mouth daily., Disp: 90 tablet, Rfl: 2   meloxicam (MOBIC)  15 MG tablet, Take 1 tablet (15 mg total) by mouth daily., Disp: 30 tablet, Rfl: 0   pramipexole (MIRAPEX) 0.5 MG tablet, TAKE ONE TABLET BY MOUTH THREE TIMES A DAY (8:00AM, 1:00PM, AND 6:00PM), Disp: 270 tablet, Rfl: 2   Objective:     There were no vitals filed for this visit.    There is no height or weight on file to calculate BMI.    Physical Exam:    ***   Electronically signed by:  Sara Nicholson [facility] 7:39 AM 09/25/23

## 2023-09-26 ENCOUNTER — Ambulatory Visit: Payer: Medicare Other | Admitting: Sports Medicine

## 2023-09-26 VITALS — HR 78 | Ht 64.0 in | Wt 224.0 lb

## 2023-09-26 DIAGNOSIS — M25561 Pain in right knee: Secondary | ICD-10-CM | POA: Diagnosis not present

## 2023-09-26 DIAGNOSIS — M1711 Unilateral primary osteoarthritis, right knee: Secondary | ICD-10-CM

## 2023-09-26 DIAGNOSIS — G8929 Other chronic pain: Secondary | ICD-10-CM | POA: Diagnosis not present

## 2023-09-26 NOTE — Patient Instructions (Signed)
Continue tylenol for day to day pain relief Meloxicam as needed for breakthrough pain limit 1-2 times per week  As needed follow up  Recommend calling us and letting us know if you would like a zilretta injection and which knee

## 2023-10-10 ENCOUNTER — Encounter: Payer: Self-pay | Admitting: Sports Medicine

## 2023-10-11 NOTE — Progress Notes (Deleted)
 Sara Nicholson [facility] [address] [place] [address] Phone: [phone]   Assessment and Plan:     There are no diagnoses linked to this encounter.  ***   Pertinent previous records reviewed include ***    Follow Up: ***     Subjective:   I, Sara Nicholson, am serving as a neurosurgeon for Sara Nicholson   Chief Complaint: right  knee pain    HPI:    01/24/2023 Patient is a 68 year old female complaining of knee pain. Patient states pain is occurring in the posterior aspect of the knee as well as the anterior aspect.  She is active and exercise on a regular basis. She has had pain for a month  She felt a twinge in the knee when she was performing Zumba.  She has swelling in her knee, no numbness or tingling    Knee Pain  The incident occurred a two weeks  ago. The incident occurred at the gym. The injury mechanism was a twisting injury. The pain is present in the right knee. The quality of the pain is described as aching. The pain is severe. The pain has been Constant since onset. Pertinent negatives include no inability to bear weight, loss of motion, loss of sensation, muscle weakness, numbness or tingling. She reports no foreign bodies present. The symptoms are aggravated by movement, palpation and weight bearing. She has tried acetaminophen  and NSAIDs for the symptoms. The treatment provided mild relief.  Onset after kick boxing and zumba class.   02/13/2023 Patient states that her knee is a little better, sitting , standing, and walking for a prolonged period is painful , now has medial knee pain , she is frustrated    02/26/2023 Patient states that she is feeling so much better    04/09/2023 Patient states that her right knee is  better just an occasional twinge. Friday night her left knee popped in the back , she had to have help getting up. She has RICEd, she has been improving since Saturday . Her calf and the back of her  knee feels tight but that pop had also relieved the pressure she had been feeling   04/30/2023 Patient states right knee is fine . Left knee is killing her    05/14/2023 Patient states tat she is better but, is having some shooting pain down in to her calf that will stop her in her tracks . She has some bruising that appeared Saturday    06/11/2023 Patient states she has intermittent pain. Has a little pain today in the one spot    08/05/2023 Patient states left knee is killing her.  states it stays swollen and getting up and down is painful    08/15/2023 Patient states   08/21/2023 Patient states here for CSI    09/26/2023 Patient states she has been doing well until this morning. She thinks she might have over extended . She does endorse antalgic gait. But still not as bad as when she came in   10/14/2023 Patient states   Relevant Historical Information: Parkinson    Additional pertinent review of systems negative.   Current Outpatient Medications:    benzonatate  (TESSALON ) 100 MG capsule, Take 1-2 capsules (100-200 mg total) by mouth 3 (three) times daily as needed., Disp: 21 capsule, Rfl: 0   carbidopa -levodopa  (SINEMET  IR) 25-100 MG tablet, TAKE ONE TABLET BY MOUTH THREE TIMES A DAY ( 7 IN THE MORNING ,  11 IN THE MORNING AND 4 P.M. ), Disp: 270 tablet, Rfl: 2   escitalopram  (LEXAPRO ) 20 MG tablet, Take 1 tablet (20 mg total) by mouth daily., Disp: 90 tablet, Rfl: 2   meloxicam  (MOBIC ) 15 MG tablet, Take 1 tablet (15 mg total) by mouth daily., Disp: 30 tablet, Rfl: 0   pramipexole  (MIRAPEX ) 0.5 MG tablet, TAKE ONE TABLET BY MOUTH THREE TIMES A DAY (8:00AM, 1:00PM, AND 6:00PM), Disp: 270 tablet, Rfl: 2   Objective:     There were no vitals filed for this visit.    There is no height or weight on file to calculate BMI.    Physical Exam:    ***   Electronically signed by:  Odis Nicholson [facility] 3:42 PM 10/11/23

## 2023-10-14 ENCOUNTER — Ambulatory Visit: Payer: Medicare Other | Admitting: Sports Medicine

## 2023-10-14 NOTE — Progress Notes (Signed)
 [name] [address] [place] [address] Phone: [phone]   Assessment and Plan:     1. Primary osteoarthritis of left knee (Primary) 2. Chronic pain of left knee -Chronic with exacerbation, subsequent visit - Most consistent with flare of mild osteoarthritis - Patient previously received 6-8 weeks relief from Zilretta  injection performed on 08/15/2023.  We hoped for 3+ months relief from Zilretta  injection, which we did not reach - Patient elected today for intra-articular knee CSI.  Tolerated well per note below - Could trial HA injections for bilateral knees.  Prior authorization ordered today.  Plan to follow-up in 1 month for possible HA injections - Continue use Tylenol  as needed for day-to-day pain relief - May continue physical activity as tolerated  Procedure: Knee Joint Injection Side: Left Indication: Flare of osteoarthritis  Risks explained and consent was given verbally. The site was cleaned with alcohol prep. A needle was introduced with an anterio-lateral approach. Injection given using 2mL of 1% lidocaine  without epinephrine  and 1mL of kenalog  40mg /ml. This was well tolerated and resulted in symptomatic relief.  Needle was removed, hemostasis achieved, and post injection instructions were explained.   Pt was advised to call or return to clinic if these symptoms worsen or fail to improve as anticipated.     Pertinent previous records reviewed include none  Follow Up: 4 weeks for reevaluation.  Could consider unilateral versus bilateral HA injections.  Could consider MRI left knee.  Could refer to orthopedic surgery.   Subjective:   I, Sara Nicholson, am serving as a neurosurgeon for Doctor Morene Mace   Chief Complaint: right  knee pain    HPI:    01/24/2023 Patient is a 68 year old female complaining of knee pain. Patient states pain is occurring in the posterior aspect of the knee as well as the  anterior aspect.  She is active and exercise on a regular basis. She has had pain for a month  She felt a twinge in the knee when she was performing Zumba.  She has swelling in her knee, no numbness or tingling    Knee Pain  The incident occurred a two weeks  ago. The incident occurred at the gym. The injury mechanism was a twisting injury. The pain is present in the right knee. The quality of the pain is described as aching. The pain is severe. The pain has been Constant since onset. Pertinent negatives include no inability to bear weight, loss of motion, loss of sensation, muscle weakness, numbness or tingling. She reports no foreign bodies present. The symptoms are aggravated by movement, palpation and weight bearing. She has tried acetaminophen  and NSAIDs for the symptoms. The treatment provided mild relief.  Onset after kick boxing and zumba class.   02/13/2023 Patient states that her knee is a little better, sitting , standing, and walking for a prolonged period is painful , now has medial knee pain , she is frustrated    02/26/2023 Patient states that she is feeling so much better    04/09/2023 Patient states that her right knee is  better just an occasional twinge. Friday night her left knee popped in the back , she had to have help getting up. She has RICEd, she has been improving since Saturday . Her calf and the back of her knee feels tight but that pop had also relieved the pressure she had been feeling   04/30/2023 Patient states right knee is fine .  Left knee is killing her    05/14/2023 Patient states tat she is better but, is having some shooting pain down in to her calf that will stop her in her tracks . She has some bruising that appeared Saturday    06/11/2023 Patient states she has intermittent pain. Has a little pain today in the one spot    08/05/2023 Patient states left knee is killing her.  states it stays swollen and getting up and down is painful    08/15/2023 Patient  states   08/21/2023 Patient states here for CSI    09/26/2023 Patient states she has been doing well until this morning. She thinks she might have over extended . She does endorse antalgic gait. But still not as bad as when she came in   10/15/2023 Patient states ready  for CSI. Left leg is swollen, pain at rest    Relevant Historical Information: Parkinson    Additional pertinent review of systems negative.   Current Outpatient Medications:    carbidopa -levodopa  (SINEMET  IR) 25-100 MG tablet, TAKE ONE TABLET BY MOUTH THREE TIMES A DAY ( 7 IN THE MORNING , 11 IN THE MORNING AND 4 P.M. ), Disp: 270 tablet, Rfl: 2   escitalopram  (LEXAPRO ) 20 MG tablet, Take 1 tablet (20 mg total) by mouth daily., Disp: 90 tablet, Rfl: 2   meloxicam  (MOBIC ) 15 MG tablet, Take 1 tablet (15 mg total) by mouth daily., Disp: 30 tablet, Rfl: 0   pramipexole  (MIRAPEX ) 0.5 MG tablet, TAKE ONE TABLET BY MOUTH THREE TIMES A DAY (8:00AM, 1:00PM, AND 6:00PM), Disp: 270 tablet, Rfl: 2   benzonatate  (TESSALON ) 100 MG capsule, Take 1-2 capsules (100-200 mg total) by mouth 3 (three) times daily as needed., Disp: 21 capsule, Rfl: 0   Objective:     Vitals:   10/15/23 1122  Pulse: 82  SpO2: 98%  Weight: 224 lb (101.6 kg)  Height: 5' 4 (1.626 m)      Body mass index is 38.45 kg/m.    Physical Exam:    General:  awake, alert oriented, no acute distress nontoxic Skin: no suspicious lesions or rashes Neuro:sensation intact and strength 5/5 with no deficits, no atrophy, normal muscle tone Psych: No signs of anxiety, depression or other mood disorder  Left knee: Moderate swelling No deformity Positive fluid wave, joint milking ROM Flex 100, Ext 10 TTP medial and lateral joint line, patella, posterior fossa, medial and lateral femoral condyle NTTP over the quad tendon,  patella tendon, tibial tuberostiy, fibular head,   pes anserine bursa, gerdy's tubercle,    Gait antalgic, favoring right  leg   Electronically signed by:  Odis Mace [name] 11:51 AM 10/15/23

## 2023-10-15 ENCOUNTER — Ambulatory Visit: Payer: Medicare Other | Admitting: Sports Medicine

## 2023-10-15 VITALS — HR 82 | Ht 64.0 in | Wt 224.0 lb

## 2023-10-15 DIAGNOSIS — M1712 Unilateral primary osteoarthritis, left knee: Secondary | ICD-10-CM

## 2023-10-15 DIAGNOSIS — M25562 Pain in left knee: Secondary | ICD-10-CM

## 2023-10-15 DIAGNOSIS — G8929 Other chronic pain: Secondary | ICD-10-CM | POA: Diagnosis not present

## 2023-10-15 NOTE — Patient Instructions (Signed)
 4 week follow up  Prior auth for HA  Recommend calling 1 week before appointment to let us know if you want 1 or both shots

## 2023-10-15 NOTE — Progress Notes (Signed)
 Assessment/Plan:   1.  Parkinsons Disease  -Continue pramipexole  0.5 mg 3 times per day.    -Continue carbidopa /levodopa  25/100, 1 tablet 3 times per day.  -we discussed focused ultrasound and DBS.  Don't think that she needs either right now.    2.  GAD  -continue lexapro , 20 mg.  She asked about potentially changing to wellbutrin but I think that this will potentially increase anxiety.  She decide to continue the lexapro   -she has stress with caregiving for sister with AD, who is now in assisted living at [facility]  3.  Intermittent insomnia  -melatonin without relief -some is related to stress and she is going to try counseling.  She doesn't need/want a referral  4.  Knee pain b/l  -following with sports med.    Subjective:   [name] was seen today in follow up for Parkinsons disease.  My previous records were reviewed prior to todays visit as well as outside records available to me.  From a Parkinson's standpoint, she has been doing well.  No falls.  No near syncope.  No hallucinations.  Continues to exercise.   she has been following with Dr. Leonce for knee pain.  Notes are reviewed.  She is contemplating knee surgery.  She is having more trouble staying asleep.  Some of this is related to the stress with her sister and some is because she can't exercise like she was.  Melatonin without relief.    Current prescribed movement disorder medications: Pramipexole  0.5 mg 3 times per day  Carbidopa /levodopa  25/100, 1 tablet 3 times per day  Lexapro , 20 mg daily   ALLERGIES:   Allergies  Allergen Reactions   Terbinafine Hcl     CURRENT MEDICATIONS:  Outpatient Encounter Medications as of 10/17/2023  Medication Sig   carbidopa -levodopa  (SINEMET  IR) 25-100 MG tablet TAKE ONE TABLET BY MOUTH THREE TIMES A DAY ( 7 IN THE MORNING , 11 IN THE MORNING AND 4 P.M. )   escitalopram  (LEXAPRO ) 20 MG tablet Take 1 tablet (20 mg total) by mouth daily.   meloxicam  (MOBIC ) 15 MG  tablet Take 1 tablet (15 mg total) by mouth daily.   pramipexole  (MIRAPEX ) 0.5 MG tablet TAKE ONE TABLET BY MOUTH THREE TIMES A DAY (8:00AM, 1:00PM, AND 6:00PM)   [DISCONTINUED] benzonatate  (TESSALON ) 100 MG capsule Take 1-2 capsules (100-200 mg total) by mouth 3 (three) times daily as needed.   No facility-administered encounter medications on file as of 10/17/2023.    Objective:   PHYSICAL EXAMINATION:    VITALS:   Vitals:   10/17/23 0841  BP: 132/88  Pulse: 88  SpO2: 96%  Weight: 224 lb (101.6 kg)  Height: 5' 4 (1.626 m)       Wt Readings from Last 3 Encounters:  10/17/23 224 lb (101.6 kg)  10/15/23 224 lb (101.6 kg)  09/26/23 224 lb (101.6 kg)      Wt Readings from Last 3 Encounters:  10/17/23 224 lb (101.6 kg)  10/15/23 224 lb (101.6 kg)  09/26/23 224 lb (101.6 kg)     GEN:  The patient appears stated age and is in NAD. HEENT:  Normocephalic, atraumatic.  The mucous membranes are moist. The superficial temporal arteries are without ropiness or tenderness. CV:  RRR Lungs:  CTAB Neck/HEME:  There are no carotid bruits bilaterally.  Neurological examination:  Orientation: The patient is alert and oriented x3. Cranial nerves: There is good facial symmetry without facial hypomimia. The speech  is fluent and clear. Soft palate rises symmetrically and there is no tongue deviation. Hearing is intact to conversational tone. Sensation: Sensation is intact to light touch throughout Motor: Strength is at least antigravity x4.  Movement examination: Tone: There is normal tone in the UE/LE Abnormal movements: there is RLE dyskinesia, mild; there is rare RUE rest tremor Coordination:  There is no decremation with RAM's, with any form of RAMS, including alternating supination and pronation of the forearm, hand opening and closing, finger taps, heel taps and toe taps. Gait and Station: The patient has no difficulty arising out of a deep-seated chair without the use of the  hands. The patient's stride length is good.    I have reviewed and interpreted the following labs independently    Chemistry      Component Value Date/Time   NA 140 05/07/2022 0946   NA 142 04/07/2019 0822   K 4.2 05/07/2022 0946   CL 106 05/07/2022 0946   CO2 27 05/07/2022 0946   BUN 18 05/07/2022 0946   BUN 12 04/07/2019 0822   CREATININE 0.70 05/07/2022 0946      Component Value Date/Time   CALCIUM 9.4 05/07/2022 0946   ALKPHOS 92 05/07/2022 0946   AST 17 05/07/2022 0946   ALT 5 05/07/2022 0946   BILITOT 0.5 05/07/2022 0946   BILITOT 0.4 04/07/2019 0822       Lab Results  Component Value Date   WBC 5.8 05/07/2022   HGB 13.1 05/07/2022   HCT 39.6 05/07/2022   MCV 91.5 05/07/2022   PLT 202.0 05/07/2022    Lab Results  Component Value Date   TSH 1.90 05/07/2022   Total time spent on today's visit was 30 minutes, including both face-to-face time and nonface-to-face time.  Time included that spent on review of records (prior notes available to me/labs/imaging if pertinent), discussing treatment and goals, answering patient's questions and coordinating care.   Cc:  Nche, Roselie [place], NP

## 2023-10-17 ENCOUNTER — Encounter: Payer: Self-pay | Admitting: Neurology

## 2023-10-17 ENCOUNTER — Ambulatory Visit: Payer: Medicare Other | Admitting: Neurology

## 2023-10-17 VITALS — BP 132/88 | HR 88 | Ht 64.0 in | Wt 224.0 lb

## 2023-10-17 DIAGNOSIS — G20B1 Parkinson's disease with dyskinesia, without mention of fluctuations: Secondary | ICD-10-CM | POA: Diagnosis not present

## 2023-10-17 NOTE — Patient Instructions (Signed)
 Local and Online Resources for Power over Parkinson's Group?  January 2025 ?  LOCAL [place] PARKINSON'S GROUPS??  Power over Parkinson's Group:???  Upcoming Power over [employer] Meetings/Care Partner Support:? 2nd Wednesdays of the month at 2 pm:  January 8th, February 12th Contact Lynwood Dawley at [email or web address] or Amy Marriott at [email or web address] if interested in participating in this group?  ?  LOCAL EVENTS AND NEW OFFERINGS?  Dance Project Spring 2025:  January 14-May 20, Tuesdays 10-11 am.  All details on website: [email or web address] ACT [employer] Chair Yoga classes "Train and Gain", Fridays 10 am, [employer].  Contact Gina at [phone].   PWR! Moves Paint class!  Wednesdays at 10 am.  Please contact Lonia Blood, PT at [email or web address] if interested. Health visitor Classes offering at [employer]!? Tuesdays (Chair Yoga)  and Wednesdays (PWR! Moves)  1:00 pm.?? Contact Aldona Lento [phone] or [email or web address] Drumming for Parkinson's will be held on 2nd and 4th Mondays at 11:00 am.?? Located at the [place] of the [place] ([address] [place]. [place].) *Next class is January 13th.? Contact Albertina Parr at [email or web address] or [phone]?  [employer] Parkinson's Tai Chi Class, Mondays at 11 am.  Call [phone] for details  [facility] at [facility]- [address], [place] Wednesdays- 4:00 - 5:00 PM - specifically for Parkinson's Disease.  Free!  Contact Denny Peon, [place] - [phone] (clinic) or  [phone] (cell) or by email: [email or web address]   ?ONLINE EDUCATION AND SUPPORT?  Parkinson Foundation:? www.parkinson.org?  PD Health at Home continues:? Mindfulness Mondays, Wellness Wednesdays, Fitness Fridays??  Upcoming Education:??  Empowerment through Movement, Wednesday,  January 15th, 1-2 pm A Deep Dive into Deep Brain Stimulation (DBS), Wednesday, January 29th, 1-2 pm Expert Briefing:    Stay tuned Register for virtual education and expert briefings (webinars) at [email or web address]  Please check out their website to sign up for emails and see their full online offerings??  ?  [employer] Foundation:? www.[email or web address].org??  Third Thursday Webinars:? On the third Thursday of every month at 12 p.m. ET, join our free live webinars to learn about various aspects of living with Parkinson's disease and our work to speed medical breakthroughs.?  Upcoming Webinar:? Managing the Hidden Symptoms:  Mood and Motivation Changes in Parkinson's.  Thursday, January 16th at 12 noon.  Check out additional information on their website to see their full online offerings?  ?  [employer]:? www.[email or web address]?  Upcoming Webinar:   Stay tuned Series:? Living with Parkinson's Meetup.?? Third Thursdays each month, 3 pm?  Care Partner Monthly Meetup.? With Jillene Bucks [place].? First Tuesday of each month, 2 pm?  Check out additional information to Live Well Today on their website?  ?  Parkinson and Movement Disorders (PMD) Alliance:? www.pmdalliance.org?  Ne[employer] Online:? Online Education Events?  Sign up for emails, which are sent weekly to give you updates on programming and online offerings?  ?  Parkinson's Association of the [place]:? www.parkinsonassociation.org?  Information on online support groups, education events, and online exercises including Yoga, Parkinson's exercises and more-LOTS of information on links to PD resources and online events?  Virtual Support Group through [employer] of the [place]-First Wednesday of each month at 2 pm   MOVEMENT AND EXERCISE OPPORTUNITIES?  PWR! Moves [place] class has returned!  Wednesdays at 10 am.  Please contact Lonia Blood, PT at  [email or web address] if interested. Parkinson's Exercise Class offerings at [employer]. Tuesdays (Chair yoga) and Wednesdays (PWR! Moves)  1:00  pm.?  Contact Aldona Lento [phone] or [email or web address]  Parkinson's Wellness Recovery (PWR! Moves)? www.pwr4life.org?  Info on the PWR! Virtual Experience:? You will have access to our expertise?through self-assessment, guided plans that start with the PD-specific fundamentals, educational content, tips, Q&A with an expert, and a growing Engineering geologist of PD-specific pre-recorded and live exercise classes of varying types and intensity - both physical and cognitive! If that is not enough, we offer 1:1 wellness consultations (in-person or virtual) to personalize your PWR! Dance movement psychotherapist.??  Parkinson [employer] Fridays:??  As part of the PD Health @ Home program, this free video series focuses each week on one aspect of fitness designed to support people living with Parkinson's.? These weekly videos highlight the Parkinson Foundation fitness guidelines for people with Parkinson's disease.?  MenusLocal.com.br?  Dance for PD website is offering free, live-stream classes throughout the week, as well as links to [employer] of classes:? [email or web address]://[email or web address]/?  Virtual dance and Pilates for Parkinson's classes: Click on the Community Tab> Parkinson's Movement Initiative Tab.? To register for classes and for more information, visit www.[email or web address].co.[email or web address] and click the "community" tab.??  [employer] Parkinson's Cycling Classes??  [facility] [employer]:? Thursdays @ Noon-Live classes at [employer] ([employer] at [email or web address]?or [phone])?  [facility]: Classes Tuesday, Wednesday and Thursday (contact [place] at [email or web address] ?or [phone])?  [employer]?  Varied levels of classes are offered Tuesdays and  Thursdays at [facility]??  Stretching with Byrd Hesselbach weekly class is also offered for people with Parkinson's?  To observe a class or for more information, call [phone] or email Patricia Nettle at [email or web address]?    ADDITIONAL SUPPORT AND RESOURCES?  [employer]:  Chiropractor:? www.well-springsolutions.org/caregiver-education/caregiver-support-group.? You may also contact Loleta Chance at [facility] -spring.org or [phone].????  Well-Spring Navigator:? Just1Navigator program, a?free service to help individuals and families through the journey of determining care for older adults.? The "Navigator" is a Child psychotherapist, Sidney Ace, who will speak with a prospective client and/or loved ones to provide an assessment of the situation and a set of recommendations for a personalized care plan -- all free of charge, and whether?[facility] [employer] offers the needed service or not. If the need is not a service we provide, we are well-connected with reputable programs in town that we can refer you to.? www.well-springsolutions.org or to speak with the Navigator, call [phone].?

## 2023-10-21 ENCOUNTER — Encounter: Payer: Self-pay | Admitting: Sports Medicine

## 2023-10-21 ENCOUNTER — Other Ambulatory Visit: Payer: Self-pay | Admitting: Sports Medicine

## 2023-10-21 DIAGNOSIS — Z23 Encounter for immunization: Secondary | ICD-10-CM | POA: Diagnosis not present

## 2023-10-21 DIAGNOSIS — M1712 Unilateral primary osteoarthritis, left knee: Secondary | ICD-10-CM | POA: Diagnosis not present

## 2023-10-21 DIAGNOSIS — G8929 Other chronic pain: Secondary | ICD-10-CM

## 2023-10-21 DIAGNOSIS — G20A1 Parkinson's disease without dyskinesia, without mention of fluctuations: Secondary | ICD-10-CM | POA: Diagnosis not present

## 2023-10-21 NOTE — Progress Notes (Signed)
 Ortho referral sent in

## 2023-10-30 ENCOUNTER — Other Ambulatory Visit: Payer: Self-pay

## 2023-10-30 ENCOUNTER — Ambulatory Visit: Payer: Medicare Other | Admitting: Orthopaedic Surgery

## 2023-10-30 VITALS — Ht 64.0 in | Wt 224.0 lb

## 2023-10-30 DIAGNOSIS — G8929 Other chronic pain: Secondary | ICD-10-CM

## 2023-10-30 DIAGNOSIS — M1712 Unilateral primary osteoarthritis, left knee: Secondary | ICD-10-CM | POA: Diagnosis not present

## 2023-10-30 DIAGNOSIS — M25562 Pain in left knee: Secondary | ICD-10-CM | POA: Diagnosis not present

## 2023-10-30 NOTE — Progress Notes (Signed)
The patient is a very pleasant 68 year old sent to me from Dr. Richardean Sale to evaluate and treat her knees.  He has done a good job in terms of trying to get her knee pain to calm down from the known arthritis on medial aspect of her right knee.  She does have plain films of both knees on the canopy system and a MRI of the right knee.  However it is her left knee that hurts her the most.  She has had a recent injection with the steroid-based mixture and she does take meloxicam and that does help.  Both knees pop but are asymptomatic at the patellofemoral joint.  The left knee hurts quite a bit on the medial aspect of her knee.  She does have Parkinson's disease but it does not affect her severely as of yet.  She says her insurance does not cover hyaluronic acid injections but she wants to consider this potential in the future.  She does have a BMI of 38.45.  I did review all of her medications and past medical history within epic.  She is a very pleasant person to talk to.  Examination of her knees today shows some varus malalignment of her left knee wound.  In the right and left knees.  There is significant medial joint line tenderness more on the left knee than the right knee.  Both knees have some patellofemoral crepitation and her ligamentously stable with full range of motion.  X-rays on the canopy system of both knees show well-maintained joint space.  The MRI of the right knee showed significant subchondral edema in the medial femoral condyle and a small meniscal root tear.  She did have areas of full-thickness cartilage loss in the medial aspect of her knee.  Her left knee has not had a MRI and the plain films show well-maintained joint space.  This point a MRI of her left knee is warranted given the severity of her pain so we can better come up with a treatment plan.  I talked about the possibility the future of one of the hyaluronic acid's called TriVisc that is out-of-pocket in terms of the  expense.  We can look to see how much it cost.  Regardless, we will see her back once we have MRI of her left knee.  She agrees with the treatment plan.

## 2023-10-31 ENCOUNTER — Encounter: Payer: Self-pay | Admitting: Sports Medicine

## 2023-11-12 ENCOUNTER — Ambulatory Visit: Payer: Medicare Other | Admitting: Sports Medicine

## 2023-11-13 ENCOUNTER — Ambulatory Visit
Admission: RE | Admit: 2023-11-13 | Discharge: 2023-11-13 | Disposition: A | Discharge status: Home / Self Care | Payer: Medicare Other | Source: Ambulatory Visit | Attending: Orthopaedic Surgery | Admitting: Orthopaedic Surgery

## 2023-11-13 DIAGNOSIS — M1712 Unilateral primary osteoarthritis, left knee: Secondary | ICD-10-CM

## 2023-11-13 DIAGNOSIS — S83412A Sprain of medial collateral ligament of left knee, initial encounter: Secondary | ICD-10-CM | POA: Diagnosis not present

## 2023-11-13 DIAGNOSIS — M25562 Pain in left knee: Secondary | ICD-10-CM | POA: Diagnosis not present

## 2023-11-13 DIAGNOSIS — S83232A Complex tear of medial meniscus, current injury, left knee, initial encounter: Secondary | ICD-10-CM | POA: Diagnosis not present

## 2023-11-13 DIAGNOSIS — G8929 Other chronic pain: Secondary | ICD-10-CM

## 2023-11-13 DIAGNOSIS — M25462 Effusion, left knee: Secondary | ICD-10-CM | POA: Diagnosis not present

## 2023-11-18 ENCOUNTER — Ambulatory Visit: Payer: Medicare Other | Admitting: Orthopaedic Surgery

## 2023-11-18 DIAGNOSIS — G8929 Other chronic pain: Secondary | ICD-10-CM

## 2023-11-18 DIAGNOSIS — M25562 Pain in left knee: Secondary | ICD-10-CM

## 2023-11-18 NOTE — Progress Notes (Addendum)
 The patient was here today for follow-up to go over a MRI of her left knee.  Unfortunately the MRI has not been read by radiology and there is no report.  She knows we will contact her once we know the results of the MRI findings.   Later this morning I did get the MRI findings of the patient's left knee.  I was able to go over the findings on the phone with her.  It does show areas of full-thickness cartilage loss throughout the weightbearing surface of the medial compartment of her knee but there is also moderate lateral thinning.  There is a complex medial meniscal tear as well.  The medial side does have extensive subchondral edema showing that the arthritis is quite significant of her left knee.  I talked to her about this in length in detail.  She is going to think about the possibility of hyaluronic acid that she has ordered for her knee for potentially this Thursday for her knee by Cleora Daft.  She is also looking into some other type of injection that her pastor has tried.  She will let us  know if she would like to proceed with knee replacement surgery.  If she decides to have that done she needs to be scheduled for a left total knee arthroplasty but also a detailed office visit appointment so I can go over her imaging studies and an knee replacement model and discussed the surgery in detail.

## 2023-11-19 ENCOUNTER — Telehealth: Payer: Self-pay | Admitting: Neurology

## 2023-11-19 NOTE — Telephone Encounter (Signed)
Sara Nicholson wanted to let us know she is having her knee surgery it is a lot worse than she knew. She asked a few questions but mostly just wanted to let Dr. Arbutus Leas know she is going to do it

## 2023-11-19 NOTE — Telephone Encounter (Signed)
Pt wants to talk with chelsea about her upcoming appts for knee surgery.

## 2023-11-20 NOTE — Progress Notes (Unsigned)
Sara Nicholson D.[facility] [address] [place] [address] Phone: [phone]   Assessment and Plan:     There are no diagnoses linked to this encounter.  ***   Pertinent previous records reviewed include ***    Follow Up: ***     Subjective:   I, Sara Nicholson, am serving as a Neurosurgeon for Doctor Richardean Sale   Chief Complaint: right  knee pain    HPI:    01/24/2023 Patient is a 68 year old female complaining of knee pain. Patient states pain is occurring in the posterior aspect of the knee as well as the anterior aspect.  She is active and exercise on a regular basis. She has had pain for a month  She felt a twinge in the knee when she was performing Zumba.  She has swelling in her knee, no numbness or tingling    Knee Pain  The incident occurred a two weeks  ago. The incident occurred at the gym. The injury mechanism was a twisting injury. The pain is present in the right knee. The quality of the pain is described as aching. The pain is severe. The pain has been Constant since onset. Pertinent negatives include no inability to bear weight, loss of motion, loss of sensation, muscle weakness, numbness or tingling. She reports no foreign bodies present. The symptoms are aggravated by movement, palpation and weight bearing. She has tried acetaminophen and NSAIDs for the symptoms. The treatment provided mild relief.  Onset after kick boxing and zumba class.   02/13/2023 Patient states that her knee is a little better, sitting , standing, and walking for a prolonged period is painful , now has medial knee pain , she is frustrated    02/26/2023 Patient states that she is feeling so much better    04/09/2023 Patient states that her right knee is  better just an occasional twinge. Friday night her left knee popped in the back , she had to have help getting up. She has RICEd, she has been improving since Saturday . Her calf and the back of her  knee feels tight but that pop had also relieved the pressure she had been feeling   04/30/2023 Patient states right knee is fine . Left knee is killing her    05/14/2023 Patient states tat she is better but, is having some shooting pain down in to her calf that will stop her in her tracks . She has some bruising that appeared Saturday    06/11/2023 Patient states she has intermittent pain. Has a little pain today in the one spot    08/05/2023 Patient states left knee is killing her.  states it stays swollen and getting up and down is painful    08/15/2023 Patient states   08/21/2023 Patient states here for CSI    09/26/2023 Patient states she has been doing well until this morning. She thinks she might have over extended . She does endorse antalgic gait. But still not as bad as when she came in    10/15/2023 Patient states ready  for CSI. Left leg is swollen, pain at rest   11/21/2023 Patient states   Relevant Historical Information: Parkinson  Additional pertinent review of systems negative.   Current Outpatient Medications:    carbidopa-levodopa (SINEMET IR) 25-100 MG tablet, TAKE ONE TABLET BY MOUTH THREE TIMES A DAY ( 7 IN THE MORNING , 11 IN THE MORNING AND 4 P.M. ), Disp: 270 tablet,  Rfl: 2   escitalopram (LEXAPRO) 20 MG tablet, Take 1 tablet (20 mg total) by mouth daily., Disp: 90 tablet, Rfl: 2   meloxicam (MOBIC) 15 MG tablet, Take 1 tablet (15 mg total) by mouth daily., Disp: 30 tablet, Rfl: 0   pramipexole (MIRAPEX) 0.5 MG tablet, TAKE ONE TABLET BY MOUTH THREE TIMES A DAY (8:00AM, 1:00PM, AND 6:00PM), Disp: 270 tablet, Rfl: 2   Objective:     There were no vitals filed for this visit.    There is no height or weight on file to calculate BMI.    Physical Exam:    ***   Electronically signed by:  Sara Nicholson D.[facility] 7:32 AM 11/20/23

## 2023-11-21 ENCOUNTER — Ambulatory Visit: Payer: Medicare Other | Admitting: Sports Medicine

## 2023-11-21 VITALS — HR 79 | Ht 64.0 in | Wt 224.0 lb

## 2023-11-21 DIAGNOSIS — M25562 Pain in left knee: Secondary | ICD-10-CM | POA: Diagnosis not present

## 2023-11-21 DIAGNOSIS — G8929 Other chronic pain: Secondary | ICD-10-CM

## 2023-11-21 DIAGNOSIS — M170 Bilateral primary osteoarthritis of knee: Secondary | ICD-10-CM

## 2023-11-21 DIAGNOSIS — M25561 Pain in right knee: Secondary | ICD-10-CM | POA: Diagnosis not present

## 2023-11-21 DIAGNOSIS — M1711 Unilateral primary osteoarthritis, right knee: Secondary | ICD-10-CM

## 2023-11-21 DIAGNOSIS — M1712 Unilateral primary osteoarthritis, left knee: Secondary | ICD-10-CM

## 2023-11-21 MED ORDER — SODIUM HYALURONATE 60 MG/3ML IX PRSY
60.0000 mg | PREFILLED_SYRINGE | Freq: Once | INTRA_ARTICULAR | Status: AC
  Administered 2023-11-21: 60 mg via INTRA_ARTICULAR

## 2023-11-21 NOTE — Patient Instructions (Signed)
YOU ARE GOING TO DO GREAT YOU ARE DOING GREAT WORK  As needed follow up

## 2023-12-10 ENCOUNTER — Other Ambulatory Visit: Payer: Self-pay | Admitting: Nurse Practitioner

## 2023-12-10 DIAGNOSIS — Z1231 Encounter for screening mammogram for malignant neoplasm of breast: Secondary | ICD-10-CM

## 2023-12-11 ENCOUNTER — Ambulatory Visit: Payer: Medicare Other | Admitting: Orthopaedic Surgery

## 2023-12-11 VITALS — Ht 64.0 in | Wt 224.0 lb

## 2023-12-11 DIAGNOSIS — M1712 Unilateral primary osteoarthritis, left knee: Secondary | ICD-10-CM | POA: Diagnosis not present

## 2023-12-11 DIAGNOSIS — M25562 Pain in left knee: Secondary | ICD-10-CM

## 2023-12-11 DIAGNOSIS — G8929 Other chronic pain: Secondary | ICD-10-CM

## 2023-12-11 NOTE — Progress Notes (Signed)
 The patient is well-known to [place].  She has well-documented severe arthritis of her left knee.  She has tried and failed all forms conservative treatment and at this visit would like to discuss knee replacement surgery.  She did have an MRI of her left knee showing areas of full-thickness cartilage loss in the weightbearing surface of the medial compartment the knee as well as moderate lateral thinning.  There is complex meniscal tearing as well.  She is 44 and she is on medications for Parkinson disease.  She is not a diabetic.  Her BMI is 38.45 and she is continuing on her weight loss journey.  She has lost 4 pounds since January.  Her right knee shows significant varus malalignment.  You can even see this when she walks.  She has a painful arc of motion of that knee and is likely stable but it is certainly painful in all 3 compartments.  Again imaging studies of confirm severe end-stage arthritis of the left knee.  I showed her knee replacement model and we went over what the surgery involves in detail.  I discussed the risks and benefits of surgery and what to expect from an intraoperative and postoperative standpoint.  We will work on getting her scheduled in the near future for a left total knee replacement.  All questions and concerns were addressed and answered.

## 2023-12-24 DIAGNOSIS — H52223 Regular astigmatism, bilateral: Secondary | ICD-10-CM | POA: Diagnosis not present

## 2023-12-30 ENCOUNTER — Telehealth: Payer: Self-pay | Admitting: Orthopaedic Surgery

## 2023-12-30 NOTE — Telephone Encounter (Signed)
 Patient called and wanted to know if her insurance paid for the surgery. CB#[phone]

## 2023-12-31 ENCOUNTER — Encounter: Payer: Self-pay | Admitting: Neurology

## 2023-12-31 ENCOUNTER — Ambulatory Visit
Admission: RE | Admit: 2023-12-31 | Discharge: 2023-12-31 | Disposition: A | Discharge status: Home / Self Care | Source: Ambulatory Visit | Attending: Nurse Practitioner | Admitting: Nurse Practitioner

## 2023-12-31 DIAGNOSIS — Z1231 Encounter for screening mammogram for malignant neoplasm of breast: Secondary | ICD-10-CM

## 2024-01-06 DIAGNOSIS — L821 Other seborrheic keratosis: Secondary | ICD-10-CM | POA: Diagnosis not present

## 2024-01-06 DIAGNOSIS — D692 Other nonthrombocytopenic purpura: Secondary | ICD-10-CM | POA: Diagnosis not present

## 2024-01-06 DIAGNOSIS — D2272 Melanocytic nevi of left lower limb, including hip: Secondary | ICD-10-CM | POA: Diagnosis not present

## 2024-01-06 DIAGNOSIS — L820 Inflamed seborrheic keratosis: Secondary | ICD-10-CM | POA: Diagnosis not present

## 2024-01-06 DIAGNOSIS — D2271 Melanocytic nevi of right lower limb, including hip: Secondary | ICD-10-CM | POA: Diagnosis not present

## 2024-01-06 DIAGNOSIS — Z85828 Personal history of other malignant neoplasm of skin: Secondary | ICD-10-CM | POA: Diagnosis not present

## 2024-01-06 DIAGNOSIS — L565 Disseminated superficial actinic porokeratosis (DSAP): Secondary | ICD-10-CM | POA: Diagnosis not present

## 2024-01-06 DIAGNOSIS — L738 Other specified follicular disorders: Secondary | ICD-10-CM | POA: Diagnosis not present

## 2024-01-06 DIAGNOSIS — D225 Melanocytic nevi of trunk: Secondary | ICD-10-CM | POA: Diagnosis not present

## 2024-01-09 NOTE — Patient Instructions (Signed)
 SURGICAL WAITING ROOM VISITATION Patients having surgery or a procedure may have no more than 2 support people in the waiting area - these visitors may rotate in the visitor waiting room.   If the patient needs to stay at the hospital during part of their recovery, the visitor guidelines for inpatient rooms apply.  PRE-OP VISITATION  Pre-op nurse will coordinate an appropriate time for 1 support person to accompany the patient in pre-op.  This support person may not rotate.  This visitor will be contacted when the time is appropriate for the visitor to come back in the pre-op area.  Please refer to the [facility] website for the visitor guidelines for Inpatients (after your surgery is over and you are in a regular room).  You are not required to quarantine at this time prior to your surgery. However, you must do this: Hand Hygiene often Do NOT share personal items Notify your provider if you are in close contact with someone who has COVID or you develop fever 100.4 or greater, new onset of sneezing, cough, sore throat, shortness of breath or body aches.  If you test positive for Covid or have been in contact with anyone that has tested positive in the last 10 days please notify you surgeon.    Your procedure is scheduled on:  Friday  January 17, 2024  Report to [facility] Main Entrance: [facility]sen entrance where the [employer] is available.   Report to admitting at: 07:30    AM  Call this number if you have any questions or problems the morning of surgery [phone]  Do not eat food after Midnight the night prior to your surgery/procedure.  After Midnight you may have the following liquids until 07:00 AM  DAY OF SURGERY  Clear Liquid Diet Water Black Coffee (sugar ok, NO MILK/CREAM OR CREAMERS)  Tea (sugar ok, NO MILK/CREAM OR CREAMERS) regular and decaf                             Plain Jell-O  with no fruit (NO RED)                                           Fruit ices  (not with fruit pulp, NO RED)                                     Popsicles (NO RED)                                                                  Juice: NO CITRUS JUICES: only apple, WHITE grape, WHITE cranberry Sports drinks like Gatorade or Powerade (NO RED)                   The day of surgery:  Drink ONE (1) Pre-Surgery Clear Ensure at  07:00 AM the morning of surgery. Drink in one sitting. Do not sip.  This drink was given to you during your hospital pre-op appointment visit. Nothing else to drink after completing  the Pre-Surgery Clear Ensure : No candy, chewing gum or throat lozenges.    FOLLOW ANY ADDITIONAL PRE OP INSTRUCTIONS YOU RECEIVED FROM YOUR SURGEON'S OFFICE!!!   Oral Hygiene is also important to reduce your risk of infection.        Remember - BRUSH YOUR TEETH THE MORNING OF SURGERY WITH YOUR REGULAR TOOTHPASTE  Do NOT smoke after Midnight the night before surgery.  STOP TAKING all Vitamins, Herbs and supplements 1 week before your surgery.   Take ONLY these medicines the morning of surgery with A SIP OF WATER: escitalopram (Lexapro), carbidopa-levodopa (Sinemet), pramipexole (Mirapex) and you may take Tylenol if needed for pain.                    You may not have any metal on your body including hair pins, jewelry, and body piercing  Do not wear make-up, lotions, powders, perfumes or deodorant  Do not wear nail polish including gel and S&S, artificial / acrylic nails, or any other type of covering on natural nails including finger and toenails. If you have artificial nails, gel coating, etc., that needs to be removed by a nail salon, Please have this removed prior to surgery. Not doing so may mean that your surgery could be cancelled or delayed if the Surgeon or anesthesia staff feels like they are unable to monitor you safely.   Do not shave 48 hours prior to surgery to avoid nicks in your skin which may contribute to postoperative infections.   Contacts,  Hearing Aids, dentures or bridgework may not be worn into surgery. DENTURES WILL BE REMOVED PRIOR TO SURGERY PLEASE DO NOT APPLY "Poly grip" OR ADHESIVES!!!  You may bring a small overnight bag with you on the day of surgery, only pack items that are not valuable. [facility] IS NOT RESPONSIBLE   FOR VALUABLES THAT ARE LOST OR STOLEN.   Do not bring your home medications to the hospital. The Pharmacy will dispense medications listed on your medication list to you during your admission in the Hospital.  Special Instructions: Bring a copy of your healthcare power of attorney and living will documents the day of surgery, if you wish to have them scanned into your [facility] Records- EPIC  Please read over the following fact sheets you were given: IF YOU HAVE QUESTIONS ABOUT YOUR PRE-OP INSTRUCTIONS, PLEASE CALL [phone].     Pre-operative 5 CHG Bath Instructions   You can play a key role in reducing the risk of infection after surgery. Your skin needs to be as free of germs as possible. You can reduce the number of germs on your skin by washing with CHG (chlorhexidine gluconate) soap before surgery. CHG is an antiseptic soap that kills germs and continues to kill germs even after washing.   DO NOT use if you have an allergy to chlorhexidine/CHG or antibacterial soaps. If your skin becomes reddened or irritated, stop using the CHG and notify one of our RNs at [phone]  Please shower with the CHG soap starting 4 days before surgery using the following schedule: START SHOWERS ON   MONDAY  January 13, 2024  Please keep in mind the following:  DO NOT shave, including legs and underarms, starting the day of your first shower.   You may shave your face at any point before/day of surgery.   Place clean sheets on your  bed the day you start using CHG soap. Use a clean washcloth (not used since being washed) for each shower. DO NOT sleep with pets once you start using the CHG.   CHG Shower Instructions:  If you choose to wash your hair and private area, wash first with your normal shampoo/soap.  After you use shampoo/soap, rinse your hair and body thoroughly to remove shampoo/soap residue.  Turn the water OFF and apply about 3 tablespoons (45 ml) of CHG soap to a CLEAN washcloth.  Apply CHG soap ONLY FROM YOUR NECK DOWN TO YOUR TOES (washing for 3-5 minutes)  DO NOT use CHG soap on face, private areas, open wounds, or sores.  Pay special attention to the area where your surgery is being performed.  If you are having back surgery, having someone wash your back for you may be helpful.  Wait 2 minutes after CHG soap is applied, then you may rinse off the CHG soap.  Pat dry with a clean towel  Put on clean clothes/pajamas   If you choose to wear lotion, please use ONLY the CHG-compatible lotions on the back of this paper.     Additional instructions for the day of surgery: DO NOT APPLY any lotions, deodorants, cologne, or perfumes.   Put on clean/comfortable clothes.  Brush your teeth.  Ask your nurse before applying any prescription medications to the skin.      CHG Compatible Lotions   Aveeno Moisturizing lotion  Cetaphil Moisturizing Cream  Cetaphil Moisturizing Lotion  Clairol Herbal Essence Moisturizing Lotion, Dry Skin  Clairol Herbal Essence Moisturizing Lotion, Extra Dry Skin  Clairol Herbal Essence Moisturizing Lotion, Normal Skin  Curel Age Defying Therapeutic Moisturizing Lotion with Alpha Hydroxy  Curel Extreme Care Body Lotion  Curel Soothing Hands Moisturizing Hand Lotion  Curel Therapeutic Moisturizing Cream, Fragrance-Free  Curel Therapeutic Moisturizing Lotion, Fragrance-Free  Curel Therapeutic Moisturizing Lotion, Original Formula  Eucerin Daily Replenishing Lotion  Eucerin  Dry Skin Therapy Plus Alpha Hydroxy Crme  Eucerin Dry Skin Therapy Plus Alpha Hydroxy Lotion  Eucerin Original Crme  Eucerin Original Lotion  Eucerin Plus Crme Eucerin Plus Lotion  Eucerin TriLipid Replenishing Lotion  Keri Anti-Bacterial Hand Lotion  Keri Deep Conditioning Original Lotion Dry Skin Formula Softly Scented  Keri Deep Conditioning Original Lotion, Fragrance Free Sensitive Skin Formula  Keri Lotion Fast Absorbing Fragrance Free Sensitive Skin Formula  Keri Lotion Fast Absorbing Softly Scented Dry Skin Formula  Keri Original Lotion  Keri Skin Renewal Lotion Keri Silky Smooth Lotion  Keri Silky Smooth Sensitive Skin Lotion  Nivea Body Creamy Conditioning Oil  Nivea Body Extra Enriched Lotion  Nivea Body Original Lotion  Nivea Body Sheer Moisturizing Lotion Nivea Crme  Nivea Skin Firming Lotion  NutraDerm 30 Skin Lotion  NutraDerm Skin Lotion  NutraDerm Therapeutic Skin Cream  NutraDerm Therapeutic Skin Lotion  ProShield Protective Hand Cream  Provon moisturizing lotion   FAILURE TO FOLLOW THESE INSTRUCTIONS MAY RESULT IN THE CANCELLATION OF YOUR SURGERY  PATIENT SIGNATURE_________________________________  NURSE SIGNATURE__________________________________  ________________________________________________________________________          Sara Nicholson    An incentive spirometer is a tool that can help keep your lungs clear and active. This tool measures how well you are filling your lungs with  each breath. Taking long deep breaths may help reverse or decrease the chance of developing breathing (pulmonary) problems (especially infection) following: A long period of time when you are unable to move or be active. BEFORE THE PROCEDURE  If the spirometer includes an indicator to show your best effort, your nurse or respiratory therapist will set it to a desired goal. If possible, sit up straight or lean slightly forward. Try not to slouch. Hold the  incentive spirometer in an upright position. INSTRUCTIONS FOR USE  Sit on the edge of your bed if possible, or sit up as far as you can in bed or on a chair. Hold the incentive spirometer in an upright position. Breathe out normally. Place the mouthpiece in your mouth and seal your lips tightly around it. Breathe in slowly and as deeply as possible, raising the piston or the ball toward the top of the column. Hold your breath for 3-5 seconds or for as long as possible. Allow the piston or ball to fall to the bottom of the column. Remove the mouthpiece from your mouth and breathe out normally. Rest for a few seconds and repeat Steps 1 through 7 at least 10 times every 1-2 hours when you are awake. Take your time and take a few normal breaths between deep breaths. The spirometer may include an indicator to show your best effort. Use the indicator as a goal to work toward during each repetition. After each set of 10 deep breaths, practice coughing to be sure your lungs are clear. If you have an incision (the cut made at the time of surgery), support your incision when coughing by placing a pillow or rolled up towels firmly against it. Once you are able to get out of bed, walk around indoors and cough well. You may stop using the incentive spirometer when instructed by your caregiver.  RISKS AND COMPLICATIONS Take your time so you do not get dizzy or light-headed. If you are in pain, you may need to take or ask for pain medication before doing incentive spirometry. It is harder to take a deep breath if you are having pain. AFTER USE Rest and breathe slowly and easily. It can be helpful to keep track of a log of your progress. Your caregiver can provide you with a simple table to help with this. If you are using the spirometer at home, follow these instructions: SEEK MEDICAL CARE IF:  You are having difficultly using the spirometer. You have trouble using the spirometer as often as instructed. Your  pain medication is not giving enough relief while using the spirometer. You develop fever of 100.5 F (38.1 C) or higher.                                                                                                    SEEK IMMEDIATE MEDICAL CARE IF:  You cough up bloody sputum that had not been present before. You develop fever of 102 F (38.9 C) or greater. You develop worsening pain at or near the incision site. MAKE SURE YOU:  Understand these instructions. Will  watch your condition. Will get help right away if you are not doing well or get worse. Document Released: 02/04/2007 Document Revised: 12/17/2011 Document Reviewed: 04/07/2007 [facility] Patient Information 2014 [place], [place].        If you would like to see a video about joint replacement:   [email or web address]

## 2024-01-09 NOTE — Progress Notes (Signed)
 COVID Vaccine received:  []  No [x]  Yes Date of any COVID positive Test in last 90 days:  PCP - Lind Covert, MD  Cardiologist -  none Neurology- Lurena Joiner Tat, DO  Chest x-ray -  EKG -  03-02-2009 Epic   will repeat Stress Test -  ECHO -  Cardiac Cath -   PCR screen: [x]  Ordered & Completed []   No Order but Needs PROFEND     []   N/A for this surgery  Surgery Plan:  []  Ambulatory   [x]  Outpatient in bed  []  Admit Anesthesia:    []  General  [x]  Spinal  []   Choice []   MAC  Pacemaker / ICD device [x]  No []  Yes   Spinal Cord Stimulator:[x]  No []  Yes       History of Sleep Apnea? [x]  No []  Yes   CPAP used?- [x]  No []  Yes    Does the patient monitor blood sugar?   [x]  N/A   []  No []  Yes  Patient has: [x]  NO Hx DM   []  Pre-DM   []  DM1  []   DM2 Last A1c was: 5.6 on   03-22-2022    Blood Thinner / Instructions:  none Aspirin Instructions:  none  ERAS Protocol Ordered: []  No  [x]  Yes PRE-SURGERY [x]  ENSURE  []  G2   Patient is to be NPO after: 0700  Dental hx: []  Dentures:  []  N/A      []  Bridge or Partial:                   []  Loose or Damaged teeth:   Comments: Patient was given the 5 CHG shower / bath instructions for TKA  surgery along with 2 bottles of the CHG soap. Patient will start this on: 01-13-24 All questions were asked and answered, Patient voiced understanding of this process.   Activity level: Patient is able / unable to climb a flight of stairs without difficulty; []  No CP  []  No SOB, but would have ___   Patient can / can not perform ADLs without assistance.   Anesthesia review: Parkinson's, depression / anxiety, Hx bradycardia.   Patient denies shortness of breath, fever, cough and chest pain at PAT appointment.  Patient verbalized understanding and agreement to the Pre-Surgical Instructions that were given to them at this PAT appointment. Patient was also educated of the need to review these PAT instructions again prior to her surgery.I reviewed the appropriate phone  numbers to call if they have any and questions or concerns.

## 2024-01-10 ENCOUNTER — Encounter (HOSPITAL_COMMUNITY): Payer: Self-pay

## 2024-01-10 ENCOUNTER — Other Ambulatory Visit: Payer: Self-pay

## 2024-01-10 ENCOUNTER — Encounter (HOSPITAL_COMMUNITY)
Admission: RE | Admit: 2024-01-10 | Discharge: 2024-01-10 | Disposition: A | Discharge status: Home / Self Care | Source: Ambulatory Visit | Attending: Orthopaedic Surgery | Admitting: Orthopaedic Surgery

## 2024-01-10 VITALS — BP 154/68 | HR 70 | Temp 97.9°F | Resp 16 | Ht 64.0 in | Wt 220.0 lb

## 2024-01-10 DIAGNOSIS — G20B2 Parkinson's disease with dyskinesia, with fluctuations: Secondary | ICD-10-CM | POA: Insufficient documentation

## 2024-01-10 DIAGNOSIS — Z01818 Encounter for other preprocedural examination: Secondary | ICD-10-CM | POA: Diagnosis not present

## 2024-01-10 DIAGNOSIS — Z79899 Other long term (current) drug therapy: Secondary | ICD-10-CM | POA: Diagnosis not present

## 2024-01-10 DIAGNOSIS — M1712 Unilateral primary osteoarthritis, left knee: Secondary | ICD-10-CM | POA: Insufficient documentation

## 2024-01-10 HISTORY — DX: Myoneural disorder, unspecified: G70.9

## 2024-01-10 HISTORY — DX: Malignant (primary) neoplasm, unspecified: C80.1

## 2024-01-10 HISTORY — DX: Unspecified osteoarthritis, unspecified site: M19.90

## 2024-01-10 HISTORY — DX: Essential (primary) hypertension: I10

## 2024-01-10 LAB — BASIC METABOLIC PANEL WITH GFR
Anion gap: 9 (ref 5–15)
BUN: 17 mg/dL (ref 8–23)
CO2: 26 mmol/L (ref 22–32)
Calcium: 9.4 mg/dL (ref 8.9–10.3)
Chloride: 103 mmol/L (ref 98–111)
Creatinine, Ser: 0.63 mg/dL (ref 0.44–1.00)
GFR, Estimated: >60 mL/min (ref >60)
Glucose, Bld: 91 mg/dL (ref 70–99)
Potassium: 4.2 mmol/L (ref 3.5–5.1)
Sodium: 138 mmol/L (ref 135–145)

## 2024-01-10 LAB — CBC
HCT: 41.2 % (ref 36.0–46.0)
Hemoglobin: 13.2 g/dL (ref 12.0–15.0)
MCH: 30.3 pg (ref 26.0–34.0)
MCHC: 32 g/dL (ref 30.0–36.0)
MCV: 94.5 fL (ref 80.0–100.0)
Platelets: 222 10*3/uL (ref 150–400)
RBC: 4.36 MIL/uL (ref 3.87–5.11)
RDW: 13.2 % (ref 11.5–15.5)
WBC: 7.9 10*3/uL (ref 4.0–10.5)
nRBC: 0 % (ref 0.0–0.2)

## 2024-01-10 LAB — SURGICAL PCR SCREEN
MRSA, PCR: NEGATIVE
Staphylococcus aureus: NEGATIVE

## 2024-01-16 ENCOUNTER — Other Ambulatory Visit: Payer: Self-pay | Admitting: *Deleted

## 2024-01-16 ENCOUNTER — Telehealth: Payer: Self-pay | Admitting: *Deleted

## 2024-01-16 DIAGNOSIS — M1712 Unilateral primary osteoarthritis, left knee: Secondary | ICD-10-CM

## 2024-01-16 NOTE — Telephone Encounter (Signed)
 [facility] pre-op call completed.

## 2024-01-16 NOTE — H&P (Signed)
 TOTAL KNEE ADMISSION H&P  Patient is being admitted for left total knee arthroplasty.  Subjective:  Chief Complaint:left knee pain.  HPI: Sara Nicholson, 68 y.o. female, has a history of pain and functional disability in the left knee due to arthritis and has failed non-surgical conservative treatments for greater than 12 weeks to includeNSAID's and/or analgesics, weight reduction as appropriate, and activity modification.  Onset of symptoms was gradual, starting several years ago with gradually worsening course since that time. The patient noted no past surgery on the left knee(s).  Patient currently rates pain in the left knee(s) at 10 out of 10 with activity. Patient has night pain, worsening of pain with activity and weight bearing, pain that interferes with activities of daily living, pain with passive range of motion, crepitus, and joint swelling.  Patient has evidence of subchondral sclerosis, periarticular osteophytes, and joint space narrowing by imaging studies. There is no active infection.  Patient Active Problem List   Diagnosis Date Noted   Unilateral primary osteoarthritis, left knee 10/30/2023   Hyperlipidemia 11/08/2022   Class 2 severe obesity due to excess calories with serious comorbidity and body mass index (BMI) of 37.0 to 37.9 in adult [facility]) 11/08/2022   Parkinson's disease (HCC) 01/16/2021   Past Medical History:  Diagnosis Date   Arthritis    Cancer (HCC)    squamous lesion right shoulder   Depression    Hypertension    Neuromuscular disorder (HCC)    Parkinson disease (HCC)    Pneumonia 1988    Past Surgical History:  Procedure Laterality Date   APPENDECTOMY     BREAST EXCISIONAL BIOPSY Left    benign cyst   CESAREAN SECTION     x1   COLONOSCOPY      No current facility-administered medications for this encounter.   Current Outpatient Medications  Medication Sig Dispense Refill Last Dose/Taking   acetaminophen (TYLENOL) 500 MG tablet Take 1,000 mg by  mouth every 6 (six) hours as needed for moderate pain (pain score 4-6).   Taking As Needed   carbidopa-levodopa (SINEMET IR) 25-100 MG tablet TAKE ONE TABLET BY MOUTH THREE TIMES A DAY ( 7 IN THE MORNING , 11 IN THE MORNING AND 4 P.M. ) 270 tablet 2 Taking   escitalopram (LEXAPRO) 20 MG tablet Take 1 tablet (20 mg total) by mouth daily. 90 tablet 2 Taking   ibuprofen (ADVIL) 200 MG tablet Take 400 mg by mouth daily as needed for moderate pain (pain score 4-6).   Taking As Needed   pramipexole (MIRAPEX) 0.5 MG tablet TAKE ONE TABLET BY MOUTH THREE TIMES A DAY (8:00AM, 1:00PM, AND 6:00PM) 270 tablet 2 Taking   Allergies  Allergen Reactions   Tape Rash    RASH Pt prefers fabric band-aids    Terbinafine Hcl Hives    Social History   Tobacco Use   Smoking status: Never   Smokeless tobacco: Never  Substance Use Topics   Alcohol use: No    Family History  Problem Relation Age of Onset   Heart attack Father    Heart attack Brother    Cancer Brother        Mass on L5   Diabetes Maternal Grandmother    Heart attack Maternal Grandfather    Heart attack Paternal Grandfather    Cancer Brother    Lung cancer Brother    Cancer - Lung Brother        mets to esophagus and brain   Neurologic Disorder  Sister 55       mild cognitive disorder   Neuromuscular disorder Mother 35       unknown diagnosis   Colon cancer Neg Hx    Colon polyps Neg Hx    Stomach cancer Neg Hx    Rectal cancer Neg Hx      Review of Systems  Objective:  Physical Exam Vitals reviewed.  Constitutional:      Appearance: Normal appearance. She is obese.  HENT:     Head: Normocephalic and atraumatic.  Eyes:     Extraocular Movements: Extraocular movements intact.     Pupils: Pupils are equal, round, and reactive to light.  Cardiovascular:     Rate and Rhythm: Normal rate and regular rhythm.  Pulmonary:     Effort: Pulmonary effort is normal.     Breath sounds: Normal breath sounds.  Abdominal:      Palpations: Abdomen is soft.  Musculoskeletal:     Cervical back: Normal range of motion and neck supple.     Left knee: Effusion, bony tenderness and crepitus present. Decreased range of motion. Tenderness present over the medial joint line and lateral joint line. Abnormal alignment and abnormal meniscus.  Neurological:     Mental Status: She is alert and oriented to person, place, and time.  Psychiatric:        Behavior: Behavior normal.     Vital signs in last 24 hours:    Labs:   Estimated body mass index is 37.76 kg/m as calculated from the following:   Height as of 01/10/24: 5\' 4"  (1.626 m).   Weight as of 01/10/24: 99.8 kg.   Imaging Review Plain radiographs and especially a MRI demonstrate severe degenerative joint disease of the left knee(s). The overall alignment ismild varus. The bone quality appears to be good for age and reported activity level.      Assessment/Plan:  End stage arthritis, left knee   The patient history, physical examination, clinical judgment of the provider and imaging studies are consistent with end stage degenerative joint disease of the left knee(s) and total knee arthroplasty is deemed medically necessary. The treatment options including medical management, injection therapy arthroscopy and arthroplasty were discussed at length. The risks and benefits of total knee arthroplasty were presented and reviewed. The risks due to aseptic loosening, infection, stiffness, patella tracking problems, thromboembolic complications and other imponderables were discussed. The patient acknowledged the explanation, agreed to proceed with the plan and consent was signed. Patient is being admitted for inpatient treatment for surgery, pain control, PT, OT, prophylactic antibiotics, VTE prophylaxis, progressive ambulation and ADL's and discharge planning. The patient is planning to be discharged home with home health services

## 2024-01-16 NOTE — Care Plan (Signed)
 [facility] RNCM call to patient to discuss her upcoming Left total knee arthroplasty with Dr. Magnus Ivan on 01/17/24 at [facility]. She is agreeable to case management. She lives with her spouse, who will be able to assist after discharge. Plan to return home. Anticipate HHPT will be needed after a short hospital stay. Referral made to [facility] after choice provided. Will need a RW. Referral made to [facility] prior to surgery. Reviewed all post op care instructions and questions answered. Will continue to follow for needs.

## 2024-01-17 ENCOUNTER — Observation Stay (HOSPITAL_COMMUNITY)
Admission: RE | Admit: 2024-01-17 | Discharge: 2024-01-18 | Disposition: A | Discharge status: Home / Self Care | Source: Ambulatory Visit | Attending: Orthopaedic Surgery | Admitting: Orthopaedic Surgery

## 2024-01-17 ENCOUNTER — Encounter (HOSPITAL_COMMUNITY)
Admission: RE | Disposition: A | Discharge status: Home / Self Care | Payer: Self-pay | Source: Ambulatory Visit | Attending: Orthopaedic Surgery

## 2024-01-17 ENCOUNTER — Ambulatory Visit (HOSPITAL_COMMUNITY): Admitting: Physician Assistant

## 2024-01-17 ENCOUNTER — Other Ambulatory Visit: Payer: Self-pay

## 2024-01-17 ENCOUNTER — Ambulatory Visit (HOSPITAL_COMMUNITY): Admitting: Anesthesiology

## 2024-01-17 ENCOUNTER — Observation Stay (HOSPITAL_COMMUNITY)

## 2024-01-17 ENCOUNTER — Encounter (HOSPITAL_COMMUNITY): Payer: Self-pay | Admitting: Orthopaedic Surgery

## 2024-01-17 DIAGNOSIS — Z79899 Other long term (current) drug therapy: Secondary | ICD-10-CM | POA: Insufficient documentation

## 2024-01-17 DIAGNOSIS — Z471 Aftercare following joint replacement surgery: Secondary | ICD-10-CM | POA: Diagnosis not present

## 2024-01-17 DIAGNOSIS — Z96652 Presence of left artificial knee joint: Secondary | ICD-10-CM

## 2024-01-17 DIAGNOSIS — J189 Pneumonia, unspecified organism: Secondary | ICD-10-CM | POA: Diagnosis not present

## 2024-01-17 DIAGNOSIS — I10 Essential (primary) hypertension: Secondary | ICD-10-CM | POA: Diagnosis not present

## 2024-01-17 DIAGNOSIS — G8918 Other acute postprocedural pain: Secondary | ICD-10-CM | POA: Diagnosis not present

## 2024-01-17 DIAGNOSIS — M1712 Unilateral primary osteoarthritis, left knee: Secondary | ICD-10-CM

## 2024-01-17 DIAGNOSIS — Z85828 Personal history of other malignant neoplasm of skin: Secondary | ICD-10-CM | POA: Diagnosis not present

## 2024-01-17 DIAGNOSIS — G20C Parkinsonism, unspecified: Secondary | ICD-10-CM | POA: Insufficient documentation

## 2024-01-17 HISTORY — PX: TOTAL KNEE ARTHROPLASTY: SHX125

## 2024-01-17 SURGERY — ARTHROPLASTY, KNEE, TOTAL
Anesthesia: Spinal | Site: Knee | Laterality: Left

## 2024-01-17 MED ORDER — METOCLOPRAMIDE HCL 5 MG PO TABS: 5.0000 mg | ORAL_TABLET | Freq: Three times a day (TID) | ORAL | Status: DC | PRN

## 2024-01-17 MED ORDER — ASPIRIN 81 MG PO CHEW
81.0000 mg | CHEWABLE_TABLET | Freq: Two times a day (BID) | ORAL | Status: DC
  Administered 2024-01-17 – 2024-01-18 (×2): 81 mg via ORAL
  Filled 2024-01-17 (×2): qty 1

## 2024-01-17 MED ORDER — HYDROMORPHONE HCL 1 MG/ML IJ SOLN: 0.5000 mg | INTRAMUSCULAR | Status: DC | PRN

## 2024-01-17 MED ORDER — SODIUM CHLORIDE 0.9 % IR SOLN
Status: DC | PRN
  Administered 2024-01-17: 1000 mL

## 2024-01-17 MED ORDER — DROPERIDOL 2.5 MG/ML IJ SOLN: 0.6250 mg | Freq: Once | INTRAMUSCULAR | Status: DC | PRN

## 2024-01-17 MED ORDER — ALUM & MAG HYDROXIDE-SIMETH 200-200-20 MG/5ML PO SUSP: 30.0000 mL | ORAL | Status: DC | PRN

## 2024-01-17 MED ORDER — PRAMIPEXOLE DIHYDROCHLORIDE 0.25 MG PO TABS
0.5000 mg | ORAL_TABLET | Freq: Three times a day (TID) | ORAL | Status: DC
  Administered 2024-01-17 – 2024-01-18 (×3): 0.5 mg via ORAL
  Filled 2024-01-17 (×3): qty 2

## 2024-01-17 MED ORDER — DEXAMETHASONE SODIUM PHOSPHATE 10 MG/ML IJ SOLN
INTRAMUSCULAR | Status: AC
  Filled 2024-01-17: qty 1

## 2024-01-17 MED ORDER — SODIUM CHLORIDE 0.9 % IV SOLN: INTRAVENOUS | Status: DC

## 2024-01-17 MED ORDER — PROPOFOL 10 MG/ML IV BOLUS
INTRAVENOUS | Status: AC
  Filled 2024-01-17: qty 20

## 2024-01-17 MED ORDER — PANTOPRAZOLE SODIUM 40 MG PO TBEC
40.0000 mg | DELAYED_RELEASE_TABLET | Freq: Every day | ORAL | Status: DC
  Administered 2024-01-17 – 2024-01-18 (×2): 40 mg via ORAL
  Filled 2024-01-17 (×2): qty 1

## 2024-01-17 MED ORDER — PROPOFOL 500 MG/50ML IV EMUL
INTRAVENOUS | Status: DC | PRN
  Administered 2024-01-17: 75 ug/kg/min via INTRAVENOUS
  Administered 2024-01-17: 50 ug/kg/min via INTRAVENOUS

## 2024-01-17 MED ORDER — ONDANSETRON HCL 4 MG/2ML IJ SOLN
4.0000 mg | Freq: Four times a day (QID) | INTRAMUSCULAR | Status: DC | PRN
  Filled 2024-01-17: qty 2

## 2024-01-17 MED ORDER — SENNA 8.6 MG PO TABS
1.0000 | ORAL_TABLET | Freq: Two times a day (BID) | ORAL | Status: DC
  Administered 2024-01-17 – 2024-01-18 (×3): 8.6 mg via ORAL
  Filled 2024-01-17 (×3): qty 1

## 2024-01-17 MED ORDER — PROPOFOL 10 MG/ML IV BOLUS
INTRAVENOUS | Status: DC | PRN
  Administered 2024-01-17: 30 mg via INTRAVENOUS
  Administered 2024-01-17: 20 mg via INTRAVENOUS

## 2024-01-17 MED ORDER — CARBIDOPA-LEVODOPA 25-100 MG PO TABS
1.0000 | ORAL_TABLET | Freq: Three times a day (TID) | ORAL | Status: DC
  Administered 2024-01-17 – 2024-01-18 (×3): 1 via ORAL
  Filled 2024-01-17 (×3): qty 1

## 2024-01-17 MED ORDER — CLONIDINE HCL (ANALGESIA) 100 MCG/ML EP SOLN
EPIDURAL | Status: DC | PRN
  Administered 2024-01-17: 70 ug

## 2024-01-17 MED ORDER — METOCLOPRAMIDE HCL 5 MG/ML IJ SOLN: 5.0000 mg | Freq: Three times a day (TID) | INTRAMUSCULAR | Status: DC | PRN

## 2024-01-17 MED ORDER — CHLORHEXIDINE GLUCONATE 0.12 % MT SOLN
15.0000 mL | Freq: Once | OROMUCOSAL | Status: AC
  Administered 2024-01-17: 15 mL via OROMUCOSAL

## 2024-01-17 MED ORDER — ONDANSETRON HCL 4 MG/2ML IJ SOLN
INTRAMUSCULAR | Status: AC
  Filled 2024-01-17: qty 2

## 2024-01-17 MED ORDER — ACETAMINOPHEN 325 MG PO TABS: 325.0000 mg | ORAL_TABLET | Freq: Four times a day (QID) | ORAL | Status: DC | PRN

## 2024-01-17 MED ORDER — ROPIVACAINE HCL 7.5 MG/ML IJ SOLN
INTRAMUSCULAR | Status: DC | PRN
Start: 2024-01-17 — End: 2024-01-17
  Administered 2024-01-17: 20 mL via PERINEURAL

## 2024-01-17 MED ORDER — TRANEXAMIC ACID-NACL 1000-0.7 MG/100ML-% IV SOLN
1000.0000 mg | INTRAVENOUS | Status: AC
  Administered 2024-01-17: 1000 mg via INTRAVENOUS
  Filled 2024-01-17: qty 100

## 2024-01-17 MED ORDER — ONDANSETRON HCL 4 MG/2ML IJ SOLN
INTRAMUSCULAR | Status: DC | PRN
  Administered 2024-01-17: 4 mg via INTRAVENOUS

## 2024-01-17 MED ORDER — TIZANIDINE HCL 4 MG PO TABS
4.0000 mg | ORAL_TABLET | Freq: Four times a day (QID) | ORAL | Status: DC | PRN
  Administered 2024-01-17 – 2024-01-18 (×3): 4 mg via ORAL
  Filled 2024-01-17 (×3): qty 1

## 2024-01-17 MED ORDER — BUPIVACAINE-EPINEPHRINE (PF) 0.25% -1:200000 IJ SOLN
INTRAMUSCULAR | Status: AC
  Filled 2024-01-17: qty 30

## 2024-01-17 MED ORDER — ACETAMINOPHEN 500 MG PO TABS
1000.0000 mg | ORAL_TABLET | Freq: Once | ORAL | Status: AC
  Administered 2024-01-17: 1000 mg via ORAL
  Filled 2024-01-17: qty 2

## 2024-01-17 MED ORDER — POVIDONE-IODINE 10 % EX SWAB
2.0000 | Freq: Once | CUTANEOUS | Status: DC
Start: 2024-01-17 — End: 2024-01-17

## 2024-01-17 MED ORDER — CEFAZOLIN SODIUM-DEXTROSE 2-4 GM/100ML-% IV SOLN
2.0000 g | INTRAVENOUS | Status: AC
  Administered 2024-01-17: 2 g via INTRAVENOUS
  Filled 2024-01-17: qty 100

## 2024-01-17 MED ORDER — OXYCODONE HCL 5 MG PO TABS
10.0000 mg | ORAL_TABLET | ORAL | Status: DC | PRN
Start: 2024-01-17 — End: 2024-01-18

## 2024-01-17 MED ORDER — MIDAZOLAM HCL 2 MG/2ML IJ SOLN
2.0000 mg | INTRAMUSCULAR | Status: AC
Start: 2024-01-17 — End: 2024-01-17
  Administered 2024-01-17: 2 mg via INTRAVENOUS
  Filled 2024-01-17: qty 2

## 2024-01-17 MED ORDER — HYDROMORPHONE HCL 1 MG/ML IJ SOLN: 0.2500 mg | INTRAMUSCULAR | Status: DC | PRN

## 2024-01-17 MED ORDER — DEXAMETHASONE SODIUM PHOSPHATE 4 MG/ML IJ SOLN
INTRAMUSCULAR | Status: DC | PRN
  Administered 2024-01-17: 5 mg via PERINEURAL

## 2024-01-17 MED ORDER — FENTANYL CITRATE PF 50 MCG/ML IJ SOSY
100.0000 ug | PREFILLED_SYRINGE | INTRAMUSCULAR | Status: AC
  Administered 2024-01-17: 50 ug via INTRAVENOUS
  Filled 2024-01-17: qty 2

## 2024-01-17 MED ORDER — DEXAMETHASONE SODIUM PHOSPHATE 10 MG/ML IJ SOLN
INTRAMUSCULAR | Status: DC | PRN
  Administered 2024-01-17: 10 mg via INTRAVENOUS

## 2024-01-17 MED ORDER — OXYCODONE HCL 5 MG PO TABS
5.0000 mg | ORAL_TABLET | ORAL | Status: DC | PRN
  Administered 2024-01-17 – 2024-01-18 (×2): 10 mg via ORAL
  Filled 2024-01-17 (×2): qty 2

## 2024-01-17 MED ORDER — ESCITALOPRAM OXALATE 20 MG PO TABS
20.0000 mg | ORAL_TABLET | Freq: Every day | ORAL | Status: DC
  Administered 2024-01-18: 20 mg via ORAL
  Filled 2024-01-17: qty 1

## 2024-01-17 MED ORDER — BUPIVACAINE-EPINEPHRINE 0.25% -1:200000 IJ SOLN
INTRAMUSCULAR | Status: DC | PRN
  Administered 2024-01-17: 30 mL

## 2024-01-17 MED ORDER — ORAL CARE MOUTH RINSE: 15.0000 mL | Freq: Once | OROMUCOSAL | Status: AC

## 2024-01-17 MED ORDER — PHENYLEPHRINE HCL-NACL 20-0.9 MG/250ML-% IV SOLN
INTRAVENOUS | Status: DC | PRN
Start: 2024-01-17 — End: 2024-01-17
  Administered 2024-01-17: 50 ug/min via INTRAVENOUS

## 2024-01-17 MED ORDER — CELECOXIB 200 MG PO CAPS
200.0000 mg | ORAL_CAPSULE | Freq: Once | ORAL | Status: AC
  Administered 2024-01-17: 200 mg via ORAL
  Filled 2024-01-17: qty 1

## 2024-01-17 MED ORDER — 0.9 % SODIUM CHLORIDE (POUR BTL) OPTIME
TOPICAL | Status: DC | PRN
Start: 2024-01-17 — End: 2024-01-17
  Administered 2024-01-17: 1000 mL

## 2024-01-17 MED ORDER — BUPIVACAINE IN DEXTROSE 0.75-8.25 % IT SOLN
INTRATHECAL | Status: DC | PRN
  Administered 2024-01-17: 1.6 mL via INTRATHECAL

## 2024-01-17 MED ORDER — ONDANSETRON HCL 4 MG PO TABS: 4.0000 mg | ORAL_TABLET | Freq: Four times a day (QID) | ORAL | Status: DC | PRN

## 2024-01-17 MED ORDER — LACTATED RINGERS IV SOLN: INTRAVENOUS | Status: DC

## 2024-01-17 SURGICAL SUPPLY — 57 items
BAG COUNTER SPONGE SURGICOUNT (BAG) IMPLANT
BAG ZIPLOCK 12X15 (MISCELLANEOUS) ×2 IMPLANT
BENZOIN TINCTURE PRP APPL 2/3 (GAUZE/BANDAGES/DRESSINGS) IMPLANT
BLADE SAG 13.0X1.37X90 (BLADE) IMPLANT
BLADE SAG 18X100X1.27 (BLADE) ×2 IMPLANT
BLADE SURG SZ10 CARB STEEL (BLADE) IMPLANT
BNDG ELASTIC 6INX 5YD STR LF (GAUZE/BANDAGES/DRESSINGS) ×4 IMPLANT
BOWL SMART MIX CTS (DISPOSABLE) IMPLANT
CEMENT BONE R 1X40 (Cement) IMPLANT
COMP FEM CMT PS STD 5 LT (Joint) ×1 IMPLANT
COMP TIB PS KNEE D 0D LT (Joint) ×1 IMPLANT
COMPONENT FEM CMT PS STD 5 LT (Joint) IMPLANT
COMPONET TIB PS KNEE D 0D LT (Joint) IMPLANT
COOLER ICEMAN CLASSIC (MISCELLANEOUS) ×2 IMPLANT
COVER SURGICAL LIGHT HANDLE (MISCELLANEOUS) ×2 IMPLANT
CUFF TRNQT CYL 34X4.125X (TOURNIQUET CUFF) ×2 IMPLANT
DRAPE INCISE IOBAN 66X45 STRL (DRAPES) ×2 IMPLANT
DRAPE U-SHAPE 47X51 STRL (DRAPES) ×2 IMPLANT
DURAPREP 26ML APPLICATOR (WOUND CARE) ×2 IMPLANT
ELECT BLADE TIP CTD 4 INCH (ELECTRODE) ×2 IMPLANT
ELECT PENCIL ROCKER SW 15FT (MISCELLANEOUS) ×2 IMPLANT
ELECT REM PT RETURN 15FT ADLT (MISCELLANEOUS) ×2 IMPLANT
GAUZE PAD ABD 8X10 STRL (GAUZE/BANDAGES/DRESSINGS) ×4 IMPLANT
GAUZE SPONGE 4X4 12PLY STRL (GAUZE/BANDAGES/DRESSINGS) ×2 IMPLANT
GAUZE XEROFORM 1X8 LF (GAUZE/BANDAGES/DRESSINGS) IMPLANT
GLOVE BIO SURGEON STRL SZ7.5 (GLOVE) ×2 IMPLANT
GLOVE BIOGEL PI IND STRL 8 (GLOVE) ×4 IMPLANT
GLOVE ECLIPSE 8.0 STRL XLNG CF (GLOVE) ×2 IMPLANT
GOWN STRL REUS W/ TWL XL LVL3 (GOWN DISPOSABLE) ×4 IMPLANT
HOLDER FOLEY CATH W/STRAP (MISCELLANEOUS) IMPLANT
IMMOBILIZER KNEE 20 (SOFTGOODS) ×1 IMPLANT
IMMOBILIZER KNEE 20 THIGH 36 (SOFTGOODS) ×2 IMPLANT
INSERT COMP PS 16 4-5/CD LT (Insert) IMPLANT
KIT TURNOVER KIT A (KITS) IMPLANT
MANIFOLD NEPTUNE II (INSTRUMENTS) ×2 IMPLANT
NS IRRIG 1000ML POUR BTL (IV SOLUTION) ×2 IMPLANT
PACK TOTAL KNEE CUSTOM (KITS) ×2 IMPLANT
PAD COLD SHLDR WRAP-ON (PAD) ×2 IMPLANT
PADDING CAST COTTON 6X4 STRL (CAST SUPPLIES) ×4 IMPLANT
PIN DRILL HDLS TROCAR 75 4PK (PIN) IMPLANT
PROTECTOR NERVE ULNAR (MISCELLANEOUS) ×2 IMPLANT
SCREW FEMALE HEX FIX 25X2.5 (ORTHOPEDIC DISPOSABLE SUPPLIES) IMPLANT
SET HNDPC FAN SPRY TIP SCT (DISPOSABLE) ×2 IMPLANT
SET PAD KNEE POSITIONER (MISCELLANEOUS) ×2 IMPLANT
SPIKE FLUID TRANSFER (MISCELLANEOUS) IMPLANT
STAPLER SKIN PROX WIDE 3.9 (STAPLE) IMPLANT
STEM POLY PAT PLY 29M KNEE (Knees) IMPLANT
STRIP CLOSURE SKIN 1/2X4 (GAUZE/BANDAGES/DRESSINGS) IMPLANT
SUT MNCRL AB 4-0 PS2 18 (SUTURE) IMPLANT
SUT VIC AB 0 CT1 36 (SUTURE) ×4 IMPLANT
SUT VIC AB 1 CT1 36 (SUTURE) ×4 IMPLANT
SUT VIC AB 2-0 CT1 TAPERPNT 27 (SUTURE) ×4 IMPLANT
TOWEL GREEN STERILE FF (TOWEL DISPOSABLE) ×2 IMPLANT
TRAY FOLEY MTR SLVR 14FR STAT (SET/KITS/TRAYS/PACK) IMPLANT
TRAY FOLEY MTR SLVR 16FR STAT (SET/KITS/TRAYS/PACK) IMPLANT
WATER STERILE IRR 1000ML POUR (IV SOLUTION) ×4 IMPLANT
YANKAUER SUCT BULB TIP NO VENT (SUCTIONS) ×2 IMPLANT

## 2024-01-17 NOTE — Transfer of Care (Signed)
 Immediate Anesthesia Transfer of Care Note  Patient: Sara Nicholson  Procedure(s) Performed: ARTHROPLASTY, KNEE, TOTAL (Left: Knee)  Patient Location: PACU  Anesthesia Type:Spinal  Level of Consciousness: awake, alert , and oriented  Airway & Oxygen Therapy: Patient Spontanous Breathing and Patient connected to face mask oxygen  Post-op Assessment: Report given to RN and Post -op Vital signs reviewed and stable  Post vital signs: Reviewed and stable  Last Vitals:  Vitals Value Taken Time  BP 143/68 01/17/24 1208  Temp    Pulse 83 01/17/24 1209  Resp 20 01/17/24 1209  SpO2 98 % 01/17/24 1209  Vitals shown include unfiled device data.  Last Pain:  Vitals:   01/17/24 0749  TempSrc: Oral         Complications: No notable events documented.

## 2024-01-17 NOTE — Plan of Care (Signed)
 Problem: Education: Goal: Knowledge of General Education information will improve Description: Including pain rating scale, medication(s)/side effects and non-pharmacologic comfort measures Outcome: Progressing   Problem: Clinical Measurements: Goal: Ability to maintain clinical measurements within normal limits will improve Outcome: Progressing   Problem: Activity: Goal: Risk for activity intolerance will decrease Outcome: Progressing   Problem: Coping: Goal: Level of anxiety will decrease Outcome: Progressing   Problem: Pain Managment: Goal: General experience of comfort will improve and/or be controlled Outcome: Progressing   Problem: Safety: Goal: Ability to remain free from injury will improve Outcome: Progressing   Haydee Salter, RN 01/17/24 5:01 PM

## 2024-01-17 NOTE — Op Note (Signed)
 Operative Note  Date of operation: 01/17/2024 Preoperative diagnosis: Left knee primary osteoarthritis Postoperative diagnosis: Same  Procedure: Left cemented total knee arthroplasty  Implants: Biomet/Zimmer persona cemented knee system Implant Name Type Inv. Item Serial No. Manufacturer Lot No. LRB No. Used Action  CEMENT BONE R 1X40 - [id number] Cement CEMENT BONE R 1X40  ZIMMER RECON(ORTH,TRAU,BIO,SG) [id number] Left 2 Implanted  COMP FEM CMT PS STD 5 LT - [id number] Joint COMP FEM CMT PS STD 5 LT  ZIMMER RECON(ORTH,TRAU,BIO,SG) [id number] Left 1 Implanted  STEM POLY PAT PLY 41M KNEE - [id number] Knees STEM POLY PAT PLY 41M KNEE  ZIMMER RECON(ORTH,TRAU,BIO,SG) [id number] Left 1 Implanted  INSERT COMP PS 16 4-5/CD LT - [id number] Insert INSERT COMP PS 16 4-5/CD LT  ZIMMER RECON(ORTH,TRAU,BIO,SG) [id number] Left 1 Implanted  COMP TIB PS KNEE D 0D LT - [id number] Joint COMP TIB PS KNEE D 0D LT  ZIMMER RECON(ORTH,TRAU,BIO,SG) [id number] Left 1 Implanted   Surgeon: Vanita Panda. Magnus Ivan, MD Assistant: Rexene Edison, PA-C  Anesthesia: #1 left lower extremity adductor canal block, #2 spinal, #3 local Tourniquet time: Under 1 hour EBL: Less than 50 cc Antibiotics: IV Ancef Complications: None  Indications: The patient is a very active 68 year old female with well-documented significant arthritis involving her right knee.  This is seen mainly on MRI studies.  It shows subchondral edema in the medial compartment of her knee as well as what areas of full-thickness cartilage loss in the weightbearing surface.  There is also arthritic changes of the patellofemoral joint.  She has tried and failed all forms conservative treatment.  At this point her left knee pain is daily and it is detrimentally affecting her mobility, her quality of life and her activities of daily living to the point she does wish to proceed with a knee replacement and we reviewed this as well.  She understands there is a heightened risk  of acute blood loss anemia, nerve or vessel injury, fracture, infection, DVT, implant failure and wound healing issues.  She understands that our goals are hopefully decreased pain, improved mobility and improved quality of life.  Procedure description: After informed consent was obtained the appropriate left knee was marked, anesthesia obtained a left lower extremity adductor canal block in the holding room.  The patient was then brought to the operating room and set up on the operative table where spinal anesthesia was obtained.  She was then laid in supine position on the operating table and a Foley catheter was placed.  A nonsterile tractors placed around her upper left thigh and her left thigh, knee, leg and ankle were prepped and draped with DuraPrep and sterile drapes including a sterile stockinette.  A timeout was called and she was identified as the correct patient the correct left knee.  An Esmarch was then used to wrap the leg and the tourniquet inflated to 300 mm pressure.  With the knee extended a direct midline incision was made over the patella and carried proximally distally.  Dissection was carried down to the knee joint and a medial parapatellar arthrotomy was made finding a moderate joint effusion.  The knee was then flexed and we did find significant full-thickness cartilage loss over wide area of the medial femoral condyle, medial tibial plateau and trochlear groove.  We removed remnants of the ACL as well as medial lateral meniscus and removed osteophytes from all 3 compartments.  We then used extramedullary cutting guide for making her proximal tibia cut correction for varus and valgus and a 7 degree  slope.  We made this cut to take 2 mm off the low side.  We then used an intramedullary based cutting guide to the notch of the femur to make our distal femur cut setting this for a left knee at 5 degrees externally rotated and a distal femoral cut of 10 mm.  We then brought the knee back down  to full extension and she actually hyperextended with a 10 mm block but her natural range of motion of the knee shows some slight hyperextension of both knees.  We then went back to the femur and put a femoral sizing guide based off the epicondylar axis.  She is a very small bone person so ended up measuring for a size 5 femur.  We put a 4-in-1 cutting block for a size 5 femur and made our anterior and posterior cuts followed our chamfer cuts.  We then back to the tibia and chose a size D left tibial tray for coverage over the tibial plateau setting the rotation off the tibial tubercle and the femur.  We made our drill hole and keel punch over this.  We then trialed our size D left tibia followed by our size 5 left CR standard femur.  We ended up trialing up to a 16 mm thickness medial congruent polythene insert for a left knee we are pleased with range of motion and stability without answer.  We then made our patella cut and drilled 3 holes for size 29 patella button.  Again with all trial instrumentation of the knee we are pleased with range of motion and stability.  We then removed all transportation from the knee and irrigated the knee with normal saline solution.  We then mixed our cement and dried the knee roll well.  With the knee in a flexed position we cemented our Biomet/Zimmer tibial tray for a left knee size D followed by cementing our size 5 left CR standard femur.  We placed our 16 mm thickness left medial congruent polythene insert and cemented our size 29 patella button.  Excess cement debris was removed from the knee and that we held the knee fully extended and compressed while the knee cement hardened.  Once that occurred we let the tourniquet down and hemostasis was obtained with electrocautery.  The arthrotomy was then closed with interrupted #1 Vicryl suture followed by 0 Vicryl goes deep tissue and 2-0 Vicryl cut subcutaneous tissue.  The skin was closed with staples.  Well-padded sterile  dressings applied.  The patient was taken the recovery room.  Rexene Edison, PA-C did assist in interrogation beginning and his assistance was crucial and medically necessary for soft tissue management and retraction, helping guide implant placement and a layered closure of the wound.

## 2024-01-17 NOTE — Anesthesia Procedure Notes (Signed)
 Date/Time: 01/17/2024 10:05 AM  Performed by: Florene Route, CRNAOxygen Delivery Method: Simple face mask

## 2024-01-17 NOTE — TOC Transition Note (Signed)
 Transition of Care [facility]) - Discharge Note   Patient Details  Name: Sara Nicholson MRN: [id number] Date of Birth: 11/24/1955  [facility] CM/SW Contact:  Amada Jupiter, LCSW Phone Number: 01/17/2024, 2:52 PM   Clinical Narrative:     Met with pt and confirming she has received RW to room via [employer].  HHPT prearranged with [facility] via ortho MD office prior to surgery.  No further TOC needs.  Final next level of care: Home w Home Health Services Barriers to Discharge: No Barriers Identified   Patient Goals and CMS Choice Patient states their goals for this hospitalization and ongoing recovery are:: return home          Discharge Placement                       Discharge Plan and Services Additional resources added to the After Visit Summary for                  DME Arranged: Walker rolling DME Agency: [employer]       HH Arranged: PT HH Agency: [facility]        Social Drivers of Health (SDOH) Interventions SDOH Screenings   Food Insecurity: No Food Insecurity (01/17/2024)  Housing: Low Risk  (01/17/2024)  Transportation Needs: No Transportation Needs (01/17/2024)  Utilities: Not At Risk (01/17/2024)  Alcohol Screen: Low Risk  (09/16/2023)  Depression (PHQ2-9): Low Risk  (09/23/2023)  Financial Resource Strain: Low Risk  (09/16/2023)  Physical Activity: Sufficiently Active (09/16/2023)  Social Connections: Socially Integrated (01/17/2024)  Stress: No Stress Concern Present (09/16/2023)  Tobacco Use: Low Risk  (01/17/2024)  Health Literacy: Adequate Health Literacy (09/23/2023)     Readmission Risk Interventions     No data to display

## 2024-01-17 NOTE — Progress Notes (Signed)
 Patient stood at bedside w/ NT. Upon standing, weight of ice man pad pulled dressing down and most proximal staple began mildly bleeding. Repositioned patient in bed for comfort. ABD applied to exposed end of incision and ace wrap adjusted to cover. Ice man in place.  Noted patient's arms and legs moving continuously. Patient states it is a result of her Parkinson's disease and the delay in receiving her medications d/t surgery. Patient requested her bedtime dose of Carb/Lev and Mirapex. Discussed w/ patient about schedule and need to contact provider if additional doses needed. Patient verbalized understanding.

## 2024-01-17 NOTE — Anesthesia Preprocedure Evaluation (Addendum)
 Anesthesia Evaluation  Patient identified by MRN, date of birth, ID band Patient awake    Reviewed: Allergy & Precautions, NPO status , Patient's Chart, lab work & pertinent test results  Airway Mallampati: III  TM Distance: >3 FB Neck ROM: Full    Dental no notable dental hx. (+) Dental Advisory Given, Teeth Intact   Pulmonary pneumonia   Pulmonary exam normal breath sounds clear to auscultation       Cardiovascular hypertension, Normal cardiovascular exam Rhythm:Regular Rate:Normal     Neuro/Psych  PSYCHIATRIC DISORDERS  Depression    negative neurological ROS     GI/Hepatic negative GI ROS, Neg liver ROS,,,  Endo/Other  negative endocrine ROS    Renal/GU negative Renal ROS     Musculoskeletal  (+) Arthritis ,    Abdominal  (+) + obese  Peds  Hematology negative hematology ROS (+)   Anesthesia Other Findings   Reproductive/Obstetrics                             Anesthesia Physical Anesthesia Plan  ASA: 3  Anesthesia Plan: Spinal   Post-op Pain Management: Tylenol PO (pre-op)*, Celebrex PO (pre-op)* and Regional block*   Induction:   PONV Risk Score and Plan: 3 and Ondansetron, Dexamethasone, Treatment may vary due to age or medical condition, Propofol infusion, TIVA and Midazolam  Airway Management Planned: Natural Airway  Additional Equipment:   Intra-op Plan:   Post-operative Plan:   Informed Consent: I have reviewed the patients History and Physical, chart, labs and discussed the procedure including the risks, benefits and alternatives for the proposed anesthesia with the patient or authorized representative who has indicated his/her understanding and acceptance.     Dental advisory given  Plan Discussed with: CRNA  Anesthesia Plan Comments: (Risks of anesthesia explained at length. This includes, but is not limited to, sore throat, damage to teeth, lips gums, tongue  and vocal cords, nausea and vomiting, reactions to medications, stroke, heart attack, and death. All patient questions were answered and the patient wishes to proceed. Risks of peripheral nerve block and spinal explained at length. This includes, but is not limited to, bleeding, infection, reactions to the medications, seizures, damage to surrounding structures, damage to nerves, permanent weakness, numbness, tingling and pain. All patient questions were answered and patient wishes to proceed with nerve block and spinal. )        Anesthesia Quick Evaluation

## 2024-01-17 NOTE — Anesthesia Procedure Notes (Signed)
 Spinal  Patient location during procedure: OR Start time: 01/17/2024 10:06 AM End time: 01/17/2024 10:11 AM Reason for block: surgical anesthesia Staffing Performed: resident/CRNA  Resident/CRNA: Florene Route, CRNA Performed by: Florene Route, CRNA Authorized by: [name], MD   Preanesthetic Checklist Completed: patient identified, IV checked, site marked, risks and benefits discussed, surgical consent, monitors and equipment checked, pre-op evaluation and timeout performed Spinal Block Patient position: sitting Prep: DuraPrep and site prepped and draped Patient monitoring: heart rate, cardiac monitor, continuous pulse ox and blood pressure Approach: midline Location: L3-4 Injection technique: single-shot Needle Needle type: Pencan  Needle gauge: 24 G Needle length: 10 cm Assessment Sensory level: T6 Events: CSF return Additional Notes Kit expiration date 04/06/2025 and lot #[id number] Clear free flow CSF, negative heme, negative paresthesia Tolerated well and returned to supine position

## 2024-01-17 NOTE — Anesthesia Postprocedure Evaluation (Signed)
 Anesthesia Post Note  Patient: Sara Nicholson  Procedure(s) Performed: ARTHROPLASTY, KNEE, TOTAL (Left: Knee)     Patient location during evaluation: PACU Anesthesia Type: Spinal Level of consciousness: sedated and patient cooperative Pain management: pain level controlled Vital Signs Assessment: post-procedure vital signs reviewed and stable Respiratory status: spontaneous breathing Cardiovascular status: stable Anesthetic complications: no   No notable events documented.  Last Vitals:  Vitals:   01/17/24 1415 01/17/24 1722  BP:  136/70  Pulse: 63 78  Resp: 15 17  Temp: 36.7 C 36.7 C  SpO2: 95% 95%    Last Pain:  Vitals:   01/17/24 1722  TempSrc: Oral  PainSc:                  [name]

## 2024-01-17 NOTE — Anesthesia Procedure Notes (Signed)
 Anesthesia Regional Block: Adductor canal block   Pre-Anesthetic Checklist: , timeout performed,  Correct Patient, Correct Site, Correct Laterality,  Correct Procedure, Correct Position, site marked,  Risks and benefits discussed,  Surgical consent,  Pre-op evaluation,  At surgeon's request and post-op pain management  Laterality: Lower and Left  Prep: chloraprep       Needles:  Injection technique: Single-shot  Needle Type: Stimiplex     Needle Length: 9cm  Needle Gauge: 21     Additional Needles:   Procedures:,,,, ultrasound used (permanent image in chart),,    Narrative:  Start time: 01/17/2024 9:32 AM End time: 01/17/2024 9:52 AM Injection made incrementally with aspirations every 5 mL.  Performed by: Personally  Anesthesiologist: [name], MD  Additional Notes: BP cuff, EKG monitors applied. Sedation begun. Artery and nerve location verified with ultrasound. Anesthetic injected incrementally (5ml), slowly, and after negative aspirations under direct u/s guidance. Good fascial/perineural spread. Tolerated well.

## 2024-01-17 NOTE — Interval H&P Note (Signed)
 History and Physical Interval Note: Patient understands that she is here today for a left total knee replacement to treat her significant left knee pain and arthritis.  There has been no acute or interval change in her medical status.  The risks and benefits of surgery have been discussed in detail and informed consent has been obtained.  The left operative knee has been marked.  01/17/2024 8:56 AM  Sara Nicholson  has presented today for surgery, with the diagnosis of osteoarthritis left knee.  The various methods of treatment have been discussed with the patient and family. After consideration of risks, benefits and other options for treatment, the patient has consented to  Procedure(s): ARTHROPLASTY, KNEE, TOTAL (Left) as a surgical intervention.  The patient's history has been reviewed, patient examined, no change in status, stable for surgery.  I have reviewed the patient's chart and labs.  Questions were answered to the patient's satisfaction.     Kathryne Hitch

## 2024-01-18 DIAGNOSIS — G20C Parkinsonism, unspecified: Secondary | ICD-10-CM | POA: Diagnosis not present

## 2024-01-18 DIAGNOSIS — I10 Essential (primary) hypertension: Secondary | ICD-10-CM | POA: Diagnosis not present

## 2024-01-18 DIAGNOSIS — Z85828 Personal history of other malignant neoplasm of skin: Secondary | ICD-10-CM | POA: Diagnosis not present

## 2024-01-18 DIAGNOSIS — Z79899 Other long term (current) drug therapy: Secondary | ICD-10-CM | POA: Diagnosis not present

## 2024-01-18 DIAGNOSIS — M1712 Unilateral primary osteoarthritis, left knee: Secondary | ICD-10-CM | POA: Diagnosis not present

## 2024-01-18 LAB — CBC
HCT: 35.1 % — ABNORMAL LOW (ref 36.0–46.0)
Hemoglobin: 11.1 g/dL — ABNORMAL LOW (ref 12.0–15.0)
MCH: 30.7 pg (ref 26.0–34.0)
MCHC: 31.6 g/dL (ref 30.0–36.0)
MCV: 97.2 fL (ref 80.0–100.0)
Platelets: 189 10*3/uL (ref 150–400)
RBC: 3.61 MIL/uL — ABNORMAL LOW (ref 3.87–5.11)
RDW: 13.2 % (ref 11.5–15.5)
WBC: 12.8 10*3/uL — ABNORMAL HIGH (ref 4.0–10.5)
nRBC: 0 % (ref 0.0–0.2)

## 2024-01-18 LAB — BASIC METABOLIC PANEL WITH GFR
Anion gap: 5 (ref 5–15)
BUN: 15 mg/dL (ref 8–23)
CO2: 25 mmol/L (ref 22–32)
Calcium: 8.2 mg/dL — ABNORMAL LOW (ref 8.9–10.3)
Chloride: 107 mmol/L (ref 98–111)
Creatinine, Ser: 0.61 mg/dL (ref 0.44–1.00)
GFR, Estimated: >60 mL/min (ref >60)
Glucose, Bld: 130 mg/dL — ABNORMAL HIGH (ref 70–99)
Potassium: 4.6 mmol/L (ref 3.5–5.1)
Sodium: 137 mmol/L (ref 135–145)

## 2024-01-18 MED ORDER — ASPIRIN 81 MG PO CHEW: 81.0000 mg | CHEWABLE_TABLET | Freq: Two times a day (BID) | ORAL | 0 refills | Status: DC

## 2024-01-18 MED ORDER — TIZANIDINE HCL 4 MG PO TABS: 4.0000 mg | ORAL_TABLET | Freq: Four times a day (QID) | ORAL | 0 refills | Status: DC | PRN

## 2024-01-18 MED ORDER — OXYCODONE HCL 5 MG PO TABS: 5.0000 mg | ORAL_TABLET | Freq: Four times a day (QID) | ORAL | 0 refills | Status: DC | PRN

## 2024-01-18 NOTE — Progress Notes (Signed)
 Physical Therapy Treatment Patient Details Name: Sara Nicholson MRN: [id number] DOB: 03-23-1956 Today's Date: 01/18/2024   History of Present Illness 68 yo female s/p L TKA on 01/17/24. PMH: depression, HTN, Parkinson's dz, pna    PT Comments  Pt progressing well this session. Mobility reviewed as below. No dizziness this pm; pt is ready to d/c from PT standpoint with spouse assisting as needed     If plan is discharge home, recommend the following: A little help with bathing/dressing/bathroom;Assist for transportation   Can travel by private vehicle        Equipment Recommendations  None recommended by PT    Recommendations for Other Services       Precautions / Restrictions Precautions Precautions: Fall;Knee Recall of Precautions/Restrictions: Intact Precaution/Restrictions Comments: ind SLRs, Ki not utilized; reviewed ice and elevation at home Knee Immobilizer - Left: Discontinue once straight leg raise with < 10 degree lag Restrictions Weight Bearing Restrictions Per Provider Order: No LLE Weight Bearing Per Provider Order: Weight bearing as tolerated     Mobility  Bed Mobility               General bed mobility comments: in recliner    Transfers Overall transfer level: Needs assistance Equipment used: Rolling walker (2 wheels) Transfers: Sit to/from Stand Sit to Stand: Supervision, Contact guard assist           General transfer comment: cues for hand placement and LLE position    Ambulation/Gait Ambulation/Gait assistance: Contact guard assist Gait Distance (Feet): 75 Feet Assistive device: Rolling walker (2 wheels) Gait Pattern/deviations: Step-to pattern, Step-through pattern, Decreased stance time - left       General Gait Details: cues for sequence and  RW position. progression to step through pattern, able to begin flexion in swing phase and heel strike on L initial contace. no incr pain   Stairs Stairs: Yes Stairs assistance: Min  assist Stair Management: Step to pattern, Forwards, Backwards, No rails, One rail Right, With walker Number of Stairs: 2 (x2) General stair comments: cues for technique; reviewed up/down stairs forwards and backwards; reviewed up/down one step with RW to demo/simulate single step into bed at home. good stability, no LOB or knee buckling; handouts provided   Wheelchair Mobility     Tilt Bed    Modified Rankin (Stroke Patients Only)       Balance                                            Communication Communication Communication: No apparent difficulties  Cognition Arousal: Alert Behavior During Therapy: WFL for tasks assessed/performed   PT - Cognitive impairments: No apparent impairments                         Following commands: Intact      Cueing Cueing Techniques: Verbal cues  Exercises Total Joint Exercises Ankle Circles/Pumps: AROM, Both, 5 reps Quad Sets: AROM, Both Heel Slides: Left, AAROM, 10 reps Straight Leg Raises: AROM, Left, 10 reps    General Comments        Pertinent Vitals/Pain Pain Assessment Pain Assessment: 0-10 Pain Score: 4  Pain Location: L knee Pain Descriptors / Indicators: Aching, Sore Pain Intervention(s): Limited activity within patient's tolerance, Monitored during session, Repositioned, Patient requesting pain meds-RN notified    Home Living  Prior Function            PT Goals (current goals can now be found in the care plan section) Acute Rehab PT Goals PT Goal Formulation: With patient Time For Goal Achievement: 02/01/24 Potential to Achieve Goals: Good Progress towards PT goals: Progressing toward goals    Frequency    7X/week      PT Plan      Co-evaluation              AM-PAC PT "6 Clicks" Mobility   Outcome Measure  Help needed turning from your back to your side while in a flat bed without using bedrails?: A Little Help needed  moving from lying on your back to sitting on the side of a flat bed without using bedrails?: A Little Help needed moving to and from a bed to a chair (including a wheelchair)?: None Help needed standing up from a chair using your arms (e.g., wheelchair or bedside chair)?: A Little Help needed to walk in hospital room?: A Little Help needed climbing 3-5 steps with a railing? : A Little 6 Click Score: 19    End of Session Equipment Utilized During Treatment: Gait belt Activity Tolerance: Patient tolerated treatment well Patient left: with call bell/phone within reach;with chair alarm set;in chair   PT Visit Diagnosis: Other abnormalities of gait and mobility (R26.89)     Time: 5621-3086 PT Time Calculation (min) (ACUTE ONLY): 30 min  Charges:    $Gait Training: 23-37 mins PT General Charges $$ ACUTE PT VISIT: 1 Visit                     Zoee Heeney, PT  Acute Rehab Dept [facility]) [phone]  01/18/2024    [facility] 01/18/2024, 2:35 PM

## 2024-01-18 NOTE — Care Management Obs Status (Signed)
 MEDICARE OBSERVATION STATUS NOTIFICATION   Patient Details  Name: Sara Nicholson MRN: [id number] Date of Birth: 1956/01/27   Medicare Observation Status Notification Given:  Birda Buffy, LCSW 01/18/2024, 12:50 PM

## 2024-01-18 NOTE — Progress Notes (Signed)
 Subjective: 1 Day Post-Op Procedure(s) (LRB): ARTHROPLASTY, KNEE, TOTAL (Left) Patient reports pain as moderate.    Objective: Vital signs in last 24 hours: Temp:  [97.7 F (36.5 C)-98.9 F (37.2 C)] 98.3 F (36.8 C) (04/12 0934) Pulse Rate:  [49-84] 80 (04/12 0934) Resp:  [14-18] 16 (04/12 0934) BP: (115-143)/(53-70) 122/56 (04/12 0934) SpO2:  [93 %-100 %] 95 % (04/12 0934)  Intake/Output from previous day: 04/11 0701 - 04/12 0700 In: 3042.1 [P.O.:840; I.V.:2002.1; IV Piggyback:200] Out: 2225 [Urine:2175; Blood:50] Intake/Output this shift: Total I/O In: 120 [P.O.:120] Out: -   Recent Labs    01/18/24 0331  HGB 11.1*   Recent Labs    01/18/24 0331  WBC 12.8*  RBC 3.61*  HCT 35.1*  PLT 189   Recent Labs    01/18/24 0331  NA 137  K 4.6  CL 107  CO2 25  BUN 15  CREATININE 0.61  GLUCOSE 130*  CALCIUM 8.2*   No results for input(s): "LABPT", "INR" in the last 72 hours.  Sensation intact distally Intact pulses distally Dorsiflexion/Plantar flexion intact Incision: dressing C/D/I Compartment soft   Assessment/Plan: 1 Day Post-Op Procedure(s) (LRB): ARTHROPLASTY, KNEE, TOTAL (Left) Up with therapy Discharge home with home health this afternoon.      Arnie Lao 01/18/2024, 10:51 AM

## 2024-01-18 NOTE — Discharge Instructions (Signed)
 Per [facility] clinic policy, our goal is ensure optimal postoperative pain control with a multimodal pain management strategy. For all [facility] patients, our goal is to wean post-operative narcotic medications by 6 weeks post-operatively. If this is not possible due to utilization of pain medication prior to surgery, your [facility] doctor will support your acute post-operative pain control for the first 6 weeks postoperatively, with a plan to transition you back to your primary pain team following that. Sara Nicholson will work to ensure a Therapist, occupational.  INSTRUCTIONS AFTER JOINT REPLACEMENT   Remove items at home which could result in a fall. This includes throw rugs or furniture in walking pathways ICE to the affected joint every three hours while awake for 30 minutes at a time, for at least the first 3-5 days, and then as needed for pain and swelling.  Continue to use ice for pain and swelling. You may notice swelling that will progress down to the foot and ankle.  This is normal after surgery.  Elevate your leg when you are not up walking on it.   Continue to use the breathing machine you got in the hospital (incentive spirometer) which will help keep your temperature down.  It is common for your temperature to cycle up and down following surgery, especially at night when you are not up moving around and exerting yourself.  The breathing machine keeps your lungs expanded and your temperature down.   DIET:  As you were doing prior to hospitalization, we recommend a well-balanced diet.  DRESSING / WOUND CARE / SHOWERING  Keep the surgical dressing until follow up.  The dressing is water proof, so you can shower without any extra covering.  IF THE DRESSING FALLS OFF or the wound gets wet inside, change the dressing with sterile gauze.  Please use good hand washing techniques before changing the dressing.  Do not use any lotions or creams on the incision until instructed by your surgeon.     ACTIVITY  Increase activity slowly as tolerated, but follow the weight bearing instructions below.   No driving for 6 weeks or until further direction given by your physician.  You cannot drive while taking narcotics.  No lifting or carrying greater than 10 lbs. until further directed by your surgeon. Avoid periods of inactivity such as sitting longer than an hour when not asleep. This helps prevent blood clots.  You may return to work once you are authorized by your doctor.     WEIGHT BEARING   Weight bearing as tolerated with assist device (walker, cane, etc) as directed, use it as long as suggested by your surgeon or therapist, typically at least 4-6 weeks.   EXERCISES  Results after joint replacement surgery are often greatly improved when you follow the exercise, range of motion and muscle strengthening exercises prescribed by your doctor. Safety measures are also important to protect the joint from further injury. Any time any of these exercises cause you to have increased pain or swelling, decrease what you are doing until you are comfortable again and then slowly increase them. If you have problems or questions, call your caregiver or physical therapist for advice.   Rehabilitation is important following a joint replacement. After just a few days of immobilization, the muscles of the leg can become weakened and shrink (atrophy).  These exercises are designed to build up the tone and strength of the thigh and leg muscles and to improve motion. Often times heat used for twenty to thirty minutes before  working out will loosen up your tissues and help with improving the range of motion but do not use heat for the first two weeks following surgery (sometimes heat can increase post-operative swelling).   These exercises can be done on a training (exercise) mat, on the floor, on a table or on a bed. Use whatever works the best and is most comfortable for you.    Use music or television  while you are exercising so that the exercises are a pleasant break in your day. This will make your life better with the exercises acting as a break in your routine that you can look forward to.   Perform all exercises about fifteen times, three times per day or as directed.  You should exercise both the operative leg and the other leg as well.  Exercises include:   Quad Sets - Tighten up the muscle on the front of the thigh (Quad) and hold for 5-10 seconds.   Straight Leg Raises - With your knee straight (if you were given a brace, keep it on), lift the leg to 60 degrees, hold for 3 seconds, and slowly lower the leg.  Perform this exercise against resistance later as your leg gets stronger.  Leg Slides: Lying on your back, slowly slide your foot toward your buttocks, bending your knee up off the floor (only go as far as is comfortable). Then slowly slide your foot back down until your leg is flat on the floor again.  Angel Wings: Lying on your back spread your legs to the side as far apart as you can without causing discomfort.  Hamstring Strength:  Lying on your back, push your heel against the floor with your leg straight by tightening up the muscles of your buttocks.  Repeat, but this time bend your knee to a comfortable angle, and push your heel against the floor.  You may put a pillow under the heel to make it more comfortable if necessary.   A rehabilitation program following joint replacement surgery can speed recovery and prevent re-injury in the future due to weakened muscles. Contact your doctor or a physical therapist for more information on knee rehabilitation.    CONSTIPATION  Constipation is defined medically as fewer than three stools per week and severe constipation as less than one stool per week.  Even if you have a regular bowel pattern at home, your normal regimen is likely to be disrupted due to multiple reasons following surgery.  Combination of anesthesia, postoperative  narcotics, change in appetite and fluid intake all can affect your bowels.   YOU MUST use at least one of the following options; they are listed in order of increasing strength to get the job done.  They are all available over the counter, and you may need to use some, POSSIBLY even all of these options:    Drink plenty of fluids (prune juice may be helpful) and high fiber foods Colace 100 mg by mouth twice a day  Senokot for constipation as directed and as needed Dulcolax (bisacodyl), take with full glass of water  Miralax (polyethylene glycol) once or twice a day as needed.  If you have tried all these things and are unable to have a bowel movement in the first 3-4 days after surgery call either your surgeon or your primary doctor.    If you experience loose stools or diarrhea, hold the medications until you stool forms back up.  If your symptoms do not get better within 1 week  or if they get worse, check with your doctor.  If you experience "the worst abdominal pain ever" or develop nausea or vomiting, please contact the office immediately for further recommendations for treatment.   ITCHING:  If you experience itching with your medications, try taking only a single pain pill, or even half a pain pill at a time.  You can also use Benadryl over the counter for itching or also to help with sleep.   TED HOSE STOCKINGS:  Use stockings on both legs until for at least 2 weeks or as directed by physician office. They may be removed at night for sleeping.  MEDICATIONS:  See your medication summary on the "After Visit Summary" that nursing will review with you.  You may have some home medications which will be placed on hold until you complete the course of blood thinner medication.  It is important for you to complete the blood thinner medication as prescribed.  PRECAUTIONS:  If you experience chest pain or shortness of breath - call 911 immediately for transfer to the hospital emergency department.    If you develop a fever greater that 101 F, purulent drainage from wound, increased redness or drainage from wound, foul odor from the wound/dressing, or calf pain - CONTACT YOUR SURGEON.                                                   FOLLOW-UP APPOINTMENTS:  If you do not already have a post-op appointment, please call the office for an appointment to be seen by your surgeon.  Guidelines for how soon to be seen are listed in your "After Visit Summary", but are typically between 1-4 weeks after surgery.  OTHER INSTRUCTIONS:   Knee Replacement:  Do not place pillow under knee, focus on keeping the knee straight while resting. CPM instructions: 0-90 degrees, 2 hours in the morning, 2 hours in the afternoon, and 2 hours in the evening. Place foam block, curve side up under heel at all times except when in CPM or when walking.  DO NOT modify, tear, cut, or change the foam block in any way.  POST-OPERATIVE OPIOID TAPER INSTRUCTIONS: It is important to wean off of your opioid medication as soon as possible. If you do not need pain medication after your surgery it is ok to stop day one. Opioids include: Codeine, Hydrocodone(Norco, Vicodin), Oxycodone(Percocet, oxycontin) and hydromorphone amongst others.  Long term and even short term use of opiods can cause: Increased pain response Dependence Constipation Depression Respiratory depression And more.  Withdrawal symptoms can include Flu like symptoms Nausea, vomiting And more Techniques to manage these symptoms Hydrate well Eat regular healthy meals Stay active Use relaxation techniques(deep breathing, meditating, yoga) Do Not substitute Alcohol to help with tapering If you have been on opioids for less than two weeks and do not have pain than it is ok to stop all together.  Plan to wean off of opioids This plan should start within one week post op of your joint replacement. Maintain the same interval or time between taking each dose  and first decrease the dose.  Cut the total daily intake of opioids by one tablet each day Next start to increase the time between doses. The last dose that should be eliminated is the evening dose.   MAKE SURE YOU:  Understand these instructions.  Get help right away if you are not doing well or get worse.    Thank you for letting us be a part of your medical care team.  It is a privilege we respect greatly.  We hope these instructions will help you stay on track for a fast and full recovery!      Dental Antibiotics:  In most cases prophylactic antibiotics for Dental procdeures after total joint surgery are not necessary.  Exceptions are as follows:  1. History of prior total joint infection  2. Severely immunocompromised (Organ Transplant, cancer chemotherapy, Rheumatoid biologic meds such as Humera)  3. Poorly controlled diabetes (A1C &gt; 8.0, blood glucose over 200)  If you have one of these conditions, contact your surgeon for an antibiotic prescription, prior to your dental procedure.

## 2024-01-18 NOTE — Evaluation (Signed)
 Physical Therapy Evaluation Patient Details Name: Sara Nicholson MRN: [id number] DOB: 04-Jun-1956 Today's Date: 01/18/2024  History of Present Illness  68 yo female s/p L TKA on 01/17/24. PMH: depression, HTN, Parkinson's dz, pna  Clinical Impression  Pt is s/p TKA resulting in the deficits listed below (see PT Problem List).  Pt mobilizing very well, pain controlled however c/o dizziness after amb 15'. BP soft, 119/56, will see again in pm. Pt motivated to d/c later today if VSS/dizziness improved  Pt will benefit from acute skilled PT to increase their independence and safety with mobility to allow discharge.          If plan is discharge home, recommend the following: A little help with bathing/dressing/bathroom;Assist for transportation   Can travel by private vehicle        Equipment Recommendations None recommended by PT  Recommendations for Other Services       Functional Status Assessment Patient has had a recent decline in their functional status and demonstrates the ability to make significant improvements in function in a reasonable and predictable amount of time.     Precautions / Restrictions Precautions Precautions: Fall;Knee Required Braces or Orthoses: Knee Immobilizer - Left Knee Immobilizer - Left: Discontinue once straight leg raise with < 10 degree lag Restrictions Weight Bearing Restrictions Per Provider Order: No LLE Weight Bearing Per Provider Order: Weight bearing as tolerated      Mobility  Bed Mobility               General bed mobility comments: pt OOB with nursing staff    Transfers Overall transfer level: Needs assistance Equipment used: Rolling walker (2 wheels) Transfers: Sit to/from Stand Sit to Stand: Contact guard assist, Supervision           General transfer comment: cues for hand placement and LLE position    Ambulation/Gait Ambulation/Gait assistance: Contact guard assist Gait Distance (Feet): 15 Feet Assistive  device: Rolling walker (2 wheels) Gait Pattern/deviations: Step-to pattern       General Gait Details: cues for sequence and  RW position. pt dizzy after 15'. BP 119/56  Stairs            Wheelchair Mobility     Tilt Bed    Modified Rankin (Stroke Patients Only)       Balance                                             Pertinent Vitals/Pain Pain Assessment Pain Assessment: 0-10 Pain Score: 5  Pain Location: L knee Pain Descriptors / Indicators: Aching, Sore Pain Intervention(s): Limited activity within patient's tolerance, Monitored during session, Premedicated before session    Home Living Family/patient expects to be discharged to:: Private residence Living Arrangements: Spouse/significant other;Children Available Help at Discharge: Family;Available 24 hours/day   Home Access: Stairs to enter   Entrance Stairs-Number of Steps: 2   Home Layout: One level Home Equipment: Agricultural consultant (2 wheels)      Prior Function Prior Level of Function : Independent/Modified Independent                     Extremity/Trunk Assessment   Upper Extremity Assessment Upper Extremity Assessment: Overall WFL for tasks assessed    Lower Extremity Assessment Lower Extremity Assessment: LLE deficits/detail LLE Deficits / Details: ankle WFL, knee extension and hip flexion grossly  3/5, ltd by post op deficits       Communication   Communication Communication: No apparent difficulties    Cognition Arousal: Alert Behavior During Therapy: WFL for tasks assessed/performed   PT - Cognitive impairments: No apparent impairments                         Following commands: Intact       Cueing Cueing Techniques: Verbal cues     General Comments      Exercises Total Joint Exercises Ankle Circles/Pumps: AROM, Both Quad Sets: AROM, Both Heel Slides: Left, AAROM, 10 reps Straight Leg Raises: AROM, Left, 10 reps   Assessment/Plan     PT Assessment Patient needs continued PT services  PT Problem List Decreased strength;Decreased range of motion;Decreased activity tolerance;Decreased balance;Decreased mobility;Pain;Decreased knowledge of use of DME       PT Treatment Interventions DME instruction;Gait training;Therapeutic activities;Functional mobility training;Therapeutic exercise;Patient/family education;Stair training    PT Goals (Current goals can be found in the Care Plan section)  Acute Rehab PT Goals PT Goal Formulation: With patient Time For Goal Achievement: 02/01/24 Potential to Achieve Goals: Good    Frequency       Co-evaluation               AM-PAC PT "6 Clicks" Mobility  Outcome Measure Help needed turning from your back to your side while in a flat bed without using bedrails?: A Little Help needed moving from lying on your back to sitting on the side of a flat bed without using bedrails?: A Little Help needed moving to and from a bed to a chair (including a wheelchair)?: A Little Help needed standing up from a chair using your arms (e.g., wheelchair or bedside chair)?: A Little Help needed to walk in hospital room?: A Little Help needed climbing 3-5 steps with a railing? : A Little 6 Click Score: 18    End of Session Equipment Utilized During Treatment: Gait belt Activity Tolerance: Patient tolerated treatment well Patient left: with call bell/phone within reach;with chair alarm set   PT Visit Diagnosis: Other abnormalities of gait and mobility (R26.89)    Time: 4782-9562 PT Time Calculation (min) (ACUTE ONLY): 20 min   Charges:   PT Evaluation $PT Eval Low Complexity: 1 Low   PT General Charges $$ ACUTE PT VISIT: 1 Visit         Anistyn Graddy, PT  Acute Rehab Dept [facility]) [phone]  01/18/2024   [facility] 01/18/2024, 11:01 AM

## 2024-01-19 DIAGNOSIS — Z96652 Presence of left artificial knee joint: Secondary | ICD-10-CM | POA: Diagnosis not present

## 2024-01-19 DIAGNOSIS — G20A1 Parkinson's disease without dyskinesia, without mention of fluctuations: Secondary | ICD-10-CM | POA: Diagnosis not present

## 2024-01-19 DIAGNOSIS — K5900 Constipation, unspecified: Secondary | ICD-10-CM | POA: Diagnosis not present

## 2024-01-19 DIAGNOSIS — Z9049 Acquired absence of other specified parts of digestive tract: Secondary | ICD-10-CM | POA: Diagnosis not present

## 2024-01-19 DIAGNOSIS — M1990 Unspecified osteoarthritis, unspecified site: Secondary | ICD-10-CM | POA: Diagnosis not present

## 2024-01-19 DIAGNOSIS — Z471 Aftercare following joint replacement surgery: Secondary | ICD-10-CM | POA: Diagnosis not present

## 2024-01-19 DIAGNOSIS — Z9181 History of falling: Secondary | ICD-10-CM | POA: Diagnosis not present

## 2024-01-19 DIAGNOSIS — I10 Essential (primary) hypertension: Secondary | ICD-10-CM | POA: Diagnosis not present

## 2024-01-19 DIAGNOSIS — E785 Hyperlipidemia, unspecified: Secondary | ICD-10-CM | POA: Diagnosis not present

## 2024-01-19 DIAGNOSIS — Z85828 Personal history of other malignant neoplasm of skin: Secondary | ICD-10-CM | POA: Diagnosis not present

## 2024-01-19 NOTE — Discharge Summary (Signed)
 Patient ID: Sara Nicholson MRN: [id number] DOB/AGE: 68/31/1957 68 y.o.  Admit date: 01/17/2024 Discharge date: 01/18/2024  Admission Diagnoses:  Principal Problem:   Unilateral primary osteoarthritis, left knee Active Problems:   Status post total left knee replacement   Discharge Diagnoses:  Same  Past Medical History:  Diagnosis Date   Arthritis    Cancer (HCC)    squamous lesion right shoulder   Depression    Hypertension    Neuromuscular disorder (HCC)    Parkinson disease (HCC)    Pneumonia 1988    Surgeries: Procedure(s): ARTHROPLASTY, KNEE, TOTAL on 01/17/2024   Consultants:   Discharged Condition: Improved  Hospital Course: KIMERLY ROWAND is an 68 y.o. female who was admitted 01/17/2024 for operative treatment ofUnilateral primary osteoarthritis, left knee. Patient has severe unremitting pain that affects sleep, daily activities, and work/hobbies. After pre-op clearance the patient was taken to the operating room on 01/17/2024 and underwent  Procedure(s): ARTHROPLASTY, KNEE, TOTAL.    Patient was given perioperative antibiotics:  Anti-infectives (From admission, onward)    Start     Dose/Rate Route Frequency Ordered Stop   01/17/24 0800  ceFAZolin (ANCEF) IVPB 2g/100 mL premix        2 g 200 mL/hr over 30 Minutes Intravenous On call to O.R. 01/17/24 0748 01/17/24 1025        Patient was given sequential compression devices, early ambulation, and chemoprophylaxis to prevent DVT.  Patient benefited maximally from hospital stay and there were no complications.    Recent vital signs: No data found.   Recent laboratory studies:  Recent Labs    01/18/24 0331  WBC 12.8*  HGB 11.1*  HCT 35.1*  PLT 189  NA 137  K 4.6  CL 107  CO2 25  BUN 15  CREATININE 0.61  GLUCOSE 130*  CALCIUM 8.2*     Discharge Medications:   Allergies as of 01/18/2024       Reactions   Chlorhexidine Rash   Tape Rash   RASH Pt prefers fabric band-aids    Terbinafine Hcl  Hives        Medication List     TAKE these medications    acetaminophen 500 MG tablet Commonly known as: TYLENOL Take 1,000 mg by mouth every 6 (six) hours as needed for moderate pain (pain score 4-6).   aspirin 81 MG chewable tablet Chew 1 tablet (81 mg total) by mouth 2 (two) times daily.   carbidopa-levodopa 25-100 MG tablet Commonly known as: SINEMET IR TAKE ONE TABLET BY MOUTH THREE TIMES A DAY ( 7 IN THE MORNING , 11 IN THE MORNING AND 4 P.M. )   escitalopram 20 MG tablet Commonly known as: LEXAPRO Take 1 tablet (20 mg total) by mouth daily.   ibuprofen 200 MG tablet Commonly known as: ADVIL Take 400 mg by mouth daily as needed for moderate pain (pain score 4-6).   oxyCODONE 5 MG immediate release tablet Commonly known as: Oxy IR/ROXICODONE Take 1-2 tablets (5-10 mg total) by mouth every 6 (six) hours as needed for moderate pain (pain score 4-6) (pain score 4-6).   pramipexole 0.5 MG tablet Commonly known as: MIRAPEX TAKE ONE TABLET BY MOUTH THREE TIMES A DAY (8:00AM, 1:00PM, AND 6:00PM)   tiZANidine 4 MG tablet Commonly known as: ZANAFLEX Take 1 tablet (4 mg total) by mouth every 6 (six) hours as needed for muscle spasms.        Diagnostic Studies: DG Knee Left Port Result Date: 01/17/2024 CLINICAL  DATA:  Status post total left knee arthroplasty. Postoperative. EXAM: PORTABLE LEFT KNEE - 1-2 VIEW COMPARISON:  Left knee radiographs 04/30/2023, MRI left knee 11/13/2023 FINDINGS: Interval total left knee arthroplasty. No perihardware lucency is seen to indicate hardware failure or loosening. Expected postoperative changes including intra-articular and subcutaneous air. Smalljoint effusion. Anterior surgical skin staples. No acute fracture or dislocation. IMPRESSION: Interval total left knee arthroplasty without evidence of hardware failure. Electronically Signed   By: Bertina Broccoli M.D.   On: 01/17/2024 15:50   MM 3D SCREENING MAMMOGRAM BILATERAL BREAST Result  Date: 01/01/2024 CLINICAL DATA:  Screening. EXAM: DIGITAL SCREENING BILATERAL MAMMOGRAM WITH TOMOSYNTHESIS AND CAD TECHNIQUE: Bilateral screening digital craniocaudal and mediolateral oblique mammograms were obtained. Bilateral screening digital breast tomosynthesis was performed. The images were evaluated with computer-aided detection. COMPARISON:  Previous exam(s). ACR Breast Density Category c: The breasts are heterogeneously dense, which may obscure small masses. FINDINGS: There are no findings suspicious for malignancy. IMPRESSION: No mammographic evidence of malignancy. A result letter of this screening mammogram will be mailed directly to the patient. RECOMMENDATION: Screening mammogram in one year. (Code:[id number]-01Y) BI-RADS CATEGORY  1: Negative. Electronically Signed   By: Anna Barnes M.D.   On: 01/01/2024 15:24    Disposition: Discharge disposition: 01-Home or Self Care          Follow-up Information     [facility] Follow up.   Specialty: Home Health Services Why: to provide home physical therapy visits Contact information: [address] [place] [address] [phone]         Arnie Lao, MD Follow up in 2 week(s).   Specialty: Orthopedic Surgery Contact information: [address] [place]  [place] [address] [phone]                  Signed: Arnie Lao 01/19/2024, 6:55 PM

## 2024-01-20 ENCOUNTER — Encounter (HOSPITAL_COMMUNITY): Payer: Self-pay | Admitting: Orthopaedic Surgery

## 2024-01-21 DIAGNOSIS — Z9049 Acquired absence of other specified parts of digestive tract: Secondary | ICD-10-CM | POA: Diagnosis not present

## 2024-01-21 DIAGNOSIS — E785 Hyperlipidemia, unspecified: Secondary | ICD-10-CM | POA: Diagnosis not present

## 2024-01-21 DIAGNOSIS — Z96652 Presence of left artificial knee joint: Secondary | ICD-10-CM | POA: Diagnosis not present

## 2024-01-21 DIAGNOSIS — K5900 Constipation, unspecified: Secondary | ICD-10-CM | POA: Diagnosis not present

## 2024-01-21 DIAGNOSIS — M1990 Unspecified osteoarthritis, unspecified site: Secondary | ICD-10-CM | POA: Diagnosis not present

## 2024-01-21 DIAGNOSIS — I10 Essential (primary) hypertension: Secondary | ICD-10-CM | POA: Diagnosis not present

## 2024-01-21 DIAGNOSIS — Z85828 Personal history of other malignant neoplasm of skin: Secondary | ICD-10-CM | POA: Diagnosis not present

## 2024-01-21 DIAGNOSIS — Z9181 History of falling: Secondary | ICD-10-CM | POA: Diagnosis not present

## 2024-01-21 DIAGNOSIS — G20A1 Parkinson's disease without dyskinesia, without mention of fluctuations: Secondary | ICD-10-CM | POA: Diagnosis not present

## 2024-01-21 DIAGNOSIS — Z471 Aftercare following joint replacement surgery: Secondary | ICD-10-CM | POA: Diagnosis not present

## 2024-01-24 DIAGNOSIS — K5900 Constipation, unspecified: Secondary | ICD-10-CM | POA: Diagnosis not present

## 2024-01-24 DIAGNOSIS — Z96652 Presence of left artificial knee joint: Secondary | ICD-10-CM | POA: Diagnosis not present

## 2024-01-24 DIAGNOSIS — Z85828 Personal history of other malignant neoplasm of skin: Secondary | ICD-10-CM | POA: Diagnosis not present

## 2024-01-24 DIAGNOSIS — Z9049 Acquired absence of other specified parts of digestive tract: Secondary | ICD-10-CM | POA: Diagnosis not present

## 2024-01-24 DIAGNOSIS — I10 Essential (primary) hypertension: Secondary | ICD-10-CM | POA: Diagnosis not present

## 2024-01-24 DIAGNOSIS — Z9181 History of falling: Secondary | ICD-10-CM | POA: Diagnosis not present

## 2024-01-24 DIAGNOSIS — M1990 Unspecified osteoarthritis, unspecified site: Secondary | ICD-10-CM | POA: Diagnosis not present

## 2024-01-24 DIAGNOSIS — Z471 Aftercare following joint replacement surgery: Secondary | ICD-10-CM | POA: Diagnosis not present

## 2024-01-24 DIAGNOSIS — E785 Hyperlipidemia, unspecified: Secondary | ICD-10-CM | POA: Diagnosis not present

## 2024-01-24 DIAGNOSIS — G20A1 Parkinson's disease without dyskinesia, without mention of fluctuations: Secondary | ICD-10-CM | POA: Diagnosis not present

## 2024-01-27 DIAGNOSIS — I10 Essential (primary) hypertension: Secondary | ICD-10-CM | POA: Diagnosis not present

## 2024-01-27 DIAGNOSIS — Z85828 Personal history of other malignant neoplasm of skin: Secondary | ICD-10-CM | POA: Diagnosis not present

## 2024-01-27 DIAGNOSIS — Z471 Aftercare following joint replacement surgery: Secondary | ICD-10-CM | POA: Diagnosis not present

## 2024-01-27 DIAGNOSIS — K5900 Constipation, unspecified: Secondary | ICD-10-CM | POA: Diagnosis not present

## 2024-01-27 DIAGNOSIS — Z9049 Acquired absence of other specified parts of digestive tract: Secondary | ICD-10-CM | POA: Diagnosis not present

## 2024-01-27 DIAGNOSIS — M1990 Unspecified osteoarthritis, unspecified site: Secondary | ICD-10-CM | POA: Diagnosis not present

## 2024-01-27 DIAGNOSIS — E785 Hyperlipidemia, unspecified: Secondary | ICD-10-CM | POA: Diagnosis not present

## 2024-01-27 DIAGNOSIS — Z96652 Presence of left artificial knee joint: Secondary | ICD-10-CM | POA: Diagnosis not present

## 2024-01-27 DIAGNOSIS — G20A1 Parkinson's disease without dyskinesia, without mention of fluctuations: Secondary | ICD-10-CM | POA: Diagnosis not present

## 2024-01-27 DIAGNOSIS — Z9181 History of falling: Secondary | ICD-10-CM | POA: Diagnosis not present

## 2024-01-28 DIAGNOSIS — M1990 Unspecified osteoarthritis, unspecified site: Secondary | ICD-10-CM | POA: Diagnosis not present

## 2024-01-28 DIAGNOSIS — K5900 Constipation, unspecified: Secondary | ICD-10-CM | POA: Diagnosis not present

## 2024-01-28 DIAGNOSIS — E785 Hyperlipidemia, unspecified: Secondary | ICD-10-CM | POA: Diagnosis not present

## 2024-01-28 DIAGNOSIS — I10 Essential (primary) hypertension: Secondary | ICD-10-CM | POA: Diagnosis not present

## 2024-01-28 DIAGNOSIS — Z96652 Presence of left artificial knee joint: Secondary | ICD-10-CM | POA: Diagnosis not present

## 2024-01-28 DIAGNOSIS — Z9181 History of falling: Secondary | ICD-10-CM | POA: Diagnosis not present

## 2024-01-28 DIAGNOSIS — Z85828 Personal history of other malignant neoplasm of skin: Secondary | ICD-10-CM | POA: Diagnosis not present

## 2024-01-28 DIAGNOSIS — G20A1 Parkinson's disease without dyskinesia, without mention of fluctuations: Secondary | ICD-10-CM | POA: Diagnosis not present

## 2024-01-28 DIAGNOSIS — Z9049 Acquired absence of other specified parts of digestive tract: Secondary | ICD-10-CM | POA: Diagnosis not present

## 2024-01-28 DIAGNOSIS — Z471 Aftercare following joint replacement surgery: Secondary | ICD-10-CM | POA: Diagnosis not present

## 2024-01-30 ENCOUNTER — Ambulatory Visit: Admitting: Orthopaedic Surgery

## 2024-01-30 ENCOUNTER — Encounter: Payer: Self-pay | Admitting: Orthopaedic Surgery

## 2024-01-30 DIAGNOSIS — Z0000 Encounter for general adult medical examination without abnormal findings: Secondary | ICD-10-CM | POA: Diagnosis not present

## 2024-01-30 DIAGNOSIS — Z96652 Presence of left artificial knee joint: Secondary | ICD-10-CM

## 2024-01-30 NOTE — Progress Notes (Signed)
 The patient is following up for first visit status post a left total knee replacement done 2 weeks ago tomorrow.  She is doing well.  She stands with a cane and not taking much pain medication.  She has been compliant with a baby aspirin  twice daily.  She reports that she has been able to flex her knee to 95 degrees.  My exam today her extension is almost full and her flexion is to 90 degrees and beyond.  Her calf is soft.  Her incision looks good.  I remove the staples and place Steri-Strips.  She can stop her baby aspirin  twice daily.  She will start outpatient physical therapy and can increase her activities as comfort allows.  She can also drop from my standpoint.  We will see her back in a month

## 2024-02-03 ENCOUNTER — Ambulatory Visit: Attending: Orthopaedic Surgery | Admitting: Physical Therapy

## 2024-02-03 ENCOUNTER — Encounter: Payer: Self-pay | Admitting: Physical Therapy

## 2024-02-03 DIAGNOSIS — M25662 Stiffness of left knee, not elsewhere classified: Secondary | ICD-10-CM | POA: Diagnosis not present

## 2024-02-03 DIAGNOSIS — R600 Localized edema: Secondary | ICD-10-CM | POA: Diagnosis not present

## 2024-02-03 DIAGNOSIS — R262 Difficulty in walking, not elsewhere classified: Secondary | ICD-10-CM | POA: Diagnosis not present

## 2024-02-03 DIAGNOSIS — M1712 Unilateral primary osteoarthritis, left knee: Secondary | ICD-10-CM | POA: Insufficient documentation

## 2024-02-03 DIAGNOSIS — M25562 Pain in left knee: Secondary | ICD-10-CM | POA: Insufficient documentation

## 2024-02-03 NOTE — Therapy (Addendum)
 OUTPATIENT PHYSICAL THERAPY LOWER EXTREMITY EVALUATION   Patient Name: Sara Nicholson MRN: [id number] DOB:04-Feb-1956, 68 y.o., female Today's Date: 02/03/2024  END OF SESSION:  PT End of Session - 02/03/24 0804     Visit Number 1    Number of Visits 13    Date for PT Re-Evaluation 05/04/24    Authorization Type [employer] Medicare    PT Start Time 0800    PT Stop Time 0845    PT Time Calculation (min) 45 min    Activity Tolerance Patient tolerated treatment well    Behavior During Therapy WFL for tasks assessed/performed             Past Medical History:  Diagnosis Date   Arthritis    Cancer (HCC)    squamous lesion right shoulder   Depression    Hypertension    Neuromuscular disorder (HCC)    Parkinson disease (HCC)    Pneumonia 1988   Past Surgical History:  Procedure Laterality Date   APPENDECTOMY     BREAST EXCISIONAL BIOPSY Left    benign cyst   CESAREAN SECTION     x1   COLONOSCOPY     TOTAL KNEE ARTHROPLASTY Left 01/17/2024   Procedure: ARTHROPLASTY, KNEE, TOTAL;  Surgeon: Arnie Lao, MD;  Location: [facility];  Service: Orthopedics;  Laterality: Left;   Patient Active Problem List   Diagnosis Date Noted   Status post total left knee replacement 01/17/2024   Unilateral primary osteoarthritis, left knee 10/30/2023   Hyperlipidemia 11/08/2022   Class 2 severe obesity due to excess calories with serious comorbidity and body mass index (BMI) of 37.0 to 37.9 in adult [facility]) 11/08/2022   Parkinson's disease (HCC) 01/16/2021    PCP: Pauletta Boroughs, MD  REFERRING PROVIDER: Lucienne Ryder, MD  REFERRING DIAG: S/P left TKA  THERAPY DIAG:  Acute pain of left knee  Stiffness of left knee, not elsewhere classified  Localized edema  Difficulty in walking, not elsewhere classified  Rationale for Evaluation and Treatment: Rehabilitation  ONSET DATE: 01/17/24  SUBJECTIVE:   SUBJECTIVE STATEMENT: Reports had knee pain for a bout a year, underwent a left TKA on  01/17/24.  Had home PT reports that she is doing well.  PERTINENT HISTORY: PD PAIN:  Are you having pain? Yes: NPRS scale: 0/10 Pain location: left knee  Pain description: ache sore Aggravating factors: walking, bending pain up 4/10 Relieving factors: mm relaxer and ice help can be 0/10  PRECAUTIONS: None  RED FLAGS: None   WEIGHT BEARING RESTRICTIONS: No  FALLS:  Has patient fallen in last 6 months? No  LIVING ENVIRONMENT: Lives with: lives with their family Lives in: House/apartment Stairs: Yes: External: 5 steps; can reach both Has following equipment at home: Single point cane, Walker - 2 wheeled, Marine scientist  OCCUPATION: retired  PLOF: Independent and goes to the gym at least 3x/week  PATIENT GOALS: get back to the gym  NEXT MD VISIT: Feb 27, 2024  OBJECTIVE:  Note: Objective measures were completed at Evaluation unless otherwise noted.  DIAGNOSTIC FINDINGS: n/a  PATIENT SURVEYS:  LEFS 40%  COGNITION: Overall cognitive status: Within functional limits for tasks assessed     SENSATION: WFL  EDEMA:  Circumferential: left mid patella 55cm, right 50 cm  MUSCLE LENGTH:  PALPATION: Swollen, mild warmth , tender to palpation medial and lateral knee  LOWER EXTREMITY ROM:  Active ROM AROM left eval PROM Left eval  Hip flexion    Hip extension  Hip abduction    Hip adduction    Hip internal rotation    Hip external rotation    Knee flexion 90 100  Knee extension 10 5  Ankle dorsiflexion    Ankle plantarflexion    Ankle inversion    Ankle eversion     (Blank rows = not tested)  LOWER EXTREMITY MMT:  MMT Right eval Left eval  Hip flexion    Hip extension    Hip abduction    Hip adduction    Hip internal rotation    Hip external rotation    Knee flexion  4  Knee extension  4  Ankle dorsiflexion    Ankle plantarflexion    Ankle inversion    Ankle eversion     (Blank rows = not tested)  LOWER EXTREMITY SPECIAL TESTS:  Knee  special tests: Patella tap test (ballotable patella): positive  and mild quad lag  FUNCTIONAL TESTS:  5 times sit to stand: 29 seconds with need to use hands Timed up and go (TUG): 21 seconds with SPC  GAIT: Distance walked: 60 feet Assistive device utilized: Single point cane Level of assistance: Modified independence Comments: slow, antalgic on the left                                                                                                                                TREATMENT DATE:  02/03/24 Evaluation  Vaso, left knee medium pressure 34 degrees   PATIENT EDUCATION:  Education details: HEP/POC Person educated: Patient Education method: Programmer, multimedia, Demonstration, Tactile cues, and Verbal cues Education comprehension: verbalized understanding  HOME EXERCISE PROGRAM: Gave her the seated knee flexion and then scoot forward for increased knee flexion  ASSESSMENT:  CLINICAL IMPRESSION: Patient is a 68 y.o. female who was seen today for physical therapy evaluation and treatment for s/p left TKA on 01/17/24.  She is doing very well, minimal c/o pain, just stiff and weak, really struggles to get up from sitting.  She does have some swelling of 5 cm bigger than the right, gait is slow and stiff.   OBJECTIVE IMPAIRMENTS: Abnormal gait, cardiopulmonary status limiting activity, decreased activity tolerance, decreased balance, decreased endurance, decreased mobility, difficulty walking, decreased ROM, decreased strength, increased edema, increased fascial restrictions, impaired perceived functional ability, increased muscle spasms, impaired flexibility, improper body mechanics, postural dysfunction, and pain.   REHAB POTENTIAL: Good  CLINICAL DECISION MAKING: Evolving/moderate complexity  EVALUATION COMPLEXITY: Moderate   GOALS: Goals reviewed with patient? Yes  SHORT TERM GOALS: Target date: 02/19/24 Independent with initial HEP Baseline: Goal status: INITIAL  LONG  TERM GOALS: Target date: 05/04/24  Independent with RICE Baseline:  Goal status: INITIAL  2.  Independent with advanced HEP Baseline:  Goal status: INITIAL  3.  Decrease pain 50% Baseline:  Goal status: INITIAL  4.  Increase AROM to 0-120 degrees flexion Baseline:  Goal status: INITIAL  5.  Walk without device Baseline:  Goal  status: INITIAL  6.  Return to the gym Baseline:  Goal status: INITIAL   PLAN:  PT FREQUENCY: 2x/week  PT DURATION: 12 weeks  PLANNED INTERVENTIONS: 97164- PT Re-evaluation, 97110-Therapeutic exercises, 97530- Therapeutic activity, 97112- Neuromuscular re-education, 97535- Self Care, 11914- Manual therapy, [phone]- Gait training, [phone]- Electrical stimulation (unattended), 97016- Vasopneumatic device, Patient/Family education, Balance training, Stair training, Taping, Joint mobilization, Scar mobilization, and Cryotherapy  PLAN FOR NEXT SESSION: work on strength, ROM and function   Takumi Din W, PT 02/03/2024, 8:05 AM

## 2024-02-04 ENCOUNTER — Other Ambulatory Visit: Payer: Self-pay

## 2024-02-04 MED ORDER — TIZANIDINE HCL 4 MG PO TABS: 4.0000 mg | ORAL_TABLET | Freq: Four times a day (QID) | ORAL | 0 refills | Status: DC | PRN

## 2024-02-06 ENCOUNTER — Ambulatory Visit: Attending: Orthopaedic Surgery | Admitting: Physical Therapy

## 2024-02-06 ENCOUNTER — Encounter: Payer: Self-pay | Admitting: Physical Therapy

## 2024-02-06 DIAGNOSIS — R600 Localized edema: Secondary | ICD-10-CM | POA: Diagnosis not present

## 2024-02-06 DIAGNOSIS — M25662 Stiffness of left knee, not elsewhere classified: Secondary | ICD-10-CM

## 2024-02-06 DIAGNOSIS — R262 Difficulty in walking, not elsewhere classified: Secondary | ICD-10-CM | POA: Diagnosis not present

## 2024-02-06 DIAGNOSIS — M25562 Pain in left knee: Secondary | ICD-10-CM | POA: Diagnosis not present

## 2024-02-06 NOTE — Therapy (Signed)
 OUTPATIENT PHYSICAL THERAPY LOWER EXTREMITY EVALUATION   Patient Name: CHANTELLE PILLADO MRN: [id number] DOB:[birth year], 68 y.o., female Today's Date: 02/06/2024  END OF SESSION:  PT End of Session - 02/06/24 1145     Visit Number 2    Number of Visits 13    Date for PT Re-Evaluation 05/04/24    Authorization Type [employer] Medicare    PT Start Time 1145    PT Stop Time 1230    PT Time Calculation (min) 45 min             Past Medical History:  Diagnosis Date   Arthritis    Cancer (HCC)    squamous lesion right shoulder   Depression    Hypertension    Neuromuscular disorder (HCC)    Parkinson disease (HCC)    Pneumonia 1988   Past Surgical History:  Procedure Laterality Date   APPENDECTOMY     BREAST EXCISIONAL BIOPSY Left    benign cyst   CESAREAN SECTION     x1   COLONOSCOPY     TOTAL KNEE ARTHROPLASTY Left 01/17/2024   Procedure: ARTHROPLASTY, KNEE, TOTAL;  Surgeon: Arnie Lao, MD;  Location: [facility];  Service: Orthopedics;  Laterality: Left;   Patient Active Problem List   Diagnosis Date Noted   Status post total left knee replacement 01/17/2024   Unilateral primary osteoarthritis, left knee 10/30/2023   Hyperlipidemia 11/08/2022   Class 2 severe obesity due to excess calories with serious comorbidity and body mass index (BMI) of 37.0 to 37.9 in adult [facility]) 11/08/2022   Parkinson's disease (HCC) 01/16/2021    PCP: Pauletta Boroughs, MD  REFERRING PROVIDER: Lucienne Ryder, MD  REFERRING DIAG: S/P left TKA  THERAPY DIAG:  Acute pain of left knee  Stiffness of left knee, not elsewhere classified  Localized edema  Difficulty in walking, not elsewhere classified  Rationale for Evaluation and Treatment: Rehabilitation  ONSET DATE: 01/17/24  SUBJECTIVE:   SUBJECTIVE STATEMENT: She has been doing 10 minutes on the bike at the gym. Has been doing well with HEP. Sometimes has weird pulling sensation around L knee.   PERTINENT HISTORY: PD PAIN:  Are you  having pain? Yes: NPRS scale: 0/10 Pain location: left knee  Pain description: ache sore Aggravating factors: walking, bending pain up 4/10 Relieving factors: mm relaxer and ice help can be 0/10  PRECAUTIONS: None  RED FLAGS: None   WEIGHT BEARING RESTRICTIONS: No  FALLS:  Has patient fallen in last 6 months? No  LIVING ENVIRONMENT: Lives with: lives with their family Lives in: House/apartment Stairs: Yes: External: 5 steps; can reach both Has following equipment at home: Single point cane, Walker - 2 wheeled, Marine scientist  OCCUPATION: retired  PLOF: Independent and goes to the gym at least 3x/week  PATIENT GOALS: get back to the gym  NEXT MD VISIT: Feb 27, 2024  OBJECTIVE:  Note: Objective measures were completed at Evaluation unless otherwise noted.  DIAGNOSTIC FINDINGS: n/a  PATIENT SURVEYS:  LEFS 40%  COGNITION: Overall cognitive status: Within functional limits for tasks assessed     SENSATION: WFL  EDEMA:  Circumferential: left mid patella 55cm, right 50 cm  MUSCLE LENGTH:  PALPATION: Swollen, mild warmth , tender to palpation medial and lateral knee  LOWER EXTREMITY ROM:  Active ROM AROM left eval PROM Left eval  Hip flexion    Hip extension    Hip abduction    Hip adduction    Hip internal rotation  Hip external rotation    Knee flexion 90 100  Knee extension 10 5  Ankle dorsiflexion    Ankle plantarflexion    Ankle inversion    Ankle eversion     (Blank rows = not tested)  LOWER EXTREMITY MMT:  MMT Right eval Left eval  Hip flexion    Hip extension    Hip abduction    Hip adduction    Hip internal rotation    Hip external rotation    Knee flexion  4  Knee extension  4  Ankle dorsiflexion    Ankle plantarflexion    Ankle inversion    Ankle eversion     (Blank rows = not tested)  LOWER EXTREMITY SPECIAL TESTS:  Knee special tests: Patella tap test (ballotable patella): positive  and mild quad lag  FUNCTIONAL  TESTS:  5 times sit to stand: 29 seconds with need to use hands Timed up and go (TUG): 21 seconds with SPC  GAIT: Distance walked: 60 feet Assistive device utilized: Single point cane Level of assistance: Modified independence Comments: slow, antalgic on the left                                                                                                                                TREATMENT DATE:  02/06/24 Patellar mobs LLE L knee PROM Nustep L3 LAQ 2x10 Red band HS curl 2x10 Sit to stand from elevated  2x10 Walking 1106ft without cane x2  4in step ups 2x10   02/03/24 Evaluation  Vaso, left knee medium pressure 34 degrees   PATIENT EDUCATION:  Education details: HEP/POC Person educated: Patient Education method: Programmer, multimedia, Demonstration, Tactile cues, and Verbal cues Education comprehension: verbalized understanding  HOME EXERCISE PROGRAM: Gave her the seated knee flexion and then scoot forward for increased knee flexion  ASSESSMENT:  CLINICAL IMPRESSION: Pt. Arrived doing well ambulating with a cane. She almost has full L knee ext, but is still limited in knee flexion. She demonstrated good quad and HS strength in her LLE. She was able to ambulate without her cane with minimal gait deviations. She did however had some lateral trunk bending, but this was most likely due to decreased confident on he LLE. Educated her on stretches at home to improve L knee ROM. She would benefit from skilled PT to further improve her LLE ROM and strength to be able to perform functional tasks.   OBJECTIVE IMPAIRMENTS: Abnormal gait, cardiopulmonary status limiting activity, decreased activity tolerance, decreased balance, decreased endurance, decreased mobility, difficulty walking, decreased ROM, decreased strength, increased edema, increased fascial restrictions, impaired perceived functional ability, increased muscle spasms, impaired flexibility, improper body mechanics, postural  dysfunction, and pain.   REHAB POTENTIAL: Good  CLINICAL DECISION MAKING: Evolving/moderate complexity  EVALUATION COMPLEXITY: Moderate   GOALS: Goals reviewed with patient? Yes  SHORT TERM GOALS: Target date: 02/19/24 Independent with initial HEP Baseline: Goal status: INITIAL  LONG TERM GOALS: Target date:  05/04/24  Independent with RICE Baseline:  Goal status: INITIAL  2.  Independent with advanced HEP Baseline:  Goal status: INITIAL  3.  Decrease pain 50% Baseline:  Goal status: INITIAL  4.  Increase AROM to 0-120 degrees flexion Baseline:  Goal status: INITIAL  5.  Walk without device Baseline:  Goal status: INITIAL  6.  Return to the gym Baseline:  Goal status: INITIAL   PLAN:  PT FREQUENCY: 2x/week  PT DURATION: 12 weeks  PLANNED INTERVENTIONS: 97164- PT Re-evaluation, 97110-Therapeutic exercises, 97530- Therapeutic activity, 97112- Neuromuscular re-education, 97535- Self Care, 16109- Manual therapy, [phone]- Gait training, [phone]- Electrical stimulation (unattended), 97016- Vasopneumatic device, Patient/Family education, Balance training, Stair training, Taping, Joint mobilization, Scar mobilization, and Cryotherapy  PLAN FOR NEXT SESSION: work on strength, ROM and function   Laurelyn Ponder 02/06/2024, 11:46 AM

## 2024-02-07 NOTE — Therapy (Signed)
 OUTPATIENT PHYSICAL THERAPY LOWER EXTREMITY TREATMENT   Patient Name: Sara Nicholson MRN: [id number] DOB:[birth year], 68 y.o., female Today's Date: 02/10/2024  END OF SESSION:  PT End of Session - 02/10/24 0845     Visit Number 3    Number of Visits 13    Date for PT Re-Evaluation 05/04/24    Authorization Type [employer] Medicare    PT Start Time 0845    PT Stop Time 0930    PT Time Calculation (min) 45 min              Past Medical History:  Diagnosis Date   Arthritis    Cancer (HCC)    squamous lesion right shoulder   Depression    Hypertension    Neuromuscular disorder (HCC)    Parkinson disease (HCC)    Pneumonia 1988   Past Surgical History:  Procedure Laterality Date   APPENDECTOMY     BREAST EXCISIONAL BIOPSY Left    benign cyst   CESAREAN SECTION     x1   COLONOSCOPY     TOTAL KNEE ARTHROPLASTY Left 01/17/2024   Procedure: ARTHROPLASTY, KNEE, TOTAL;  Surgeon: Arnie Lao, MD;  Location: [facility];  Service: Orthopedics;  Laterality: Left;   Patient Active Problem List   Diagnosis Date Noted   Status post total left knee replacement 01/17/2024   Unilateral primary osteoarthritis, left knee 10/30/2023   Hyperlipidemia 11/08/2022   Class 2 severe obesity due to excess calories with serious comorbidity and body mass index (BMI) of 37.0 to 37.9 in adult [facility]) 11/08/2022   Parkinson's disease (HCC) 01/16/2021    PCP: Pauletta Boroughs, MD  REFERRING PROVIDER: Lucienne Ryder, MD  REFERRING DIAG: S/P left TKA  THERAPY DIAG:  Acute pain of left knee  Stiffness of left knee, not elsewhere classified  Localized edema  Difficulty in walking, not elsewhere classified  Rationale for Evaluation and Treatment: Rehabilitation  ONSET DATE: 01/17/24  SUBJECTIVE:   SUBJECTIVE STATEMENT: Just tight and sore this morning.   PERTINENT HISTORY: PD PAIN:  Are you having pain? Yes: NPRS scale: 0/10 Pain location: left knee  Pain description: ache sore Aggravating  factors: walking, bending pain up 4/10 Relieving factors: mm relaxer and ice help can be 0/10  PRECAUTIONS: None  RED FLAGS: None   WEIGHT BEARING RESTRICTIONS: No  FALLS:  Has patient fallen in last 6 months? No  LIVING ENVIRONMENT: Lives with: lives with their family Lives in: House/apartment Stairs: Yes: External: 5 steps; can reach both Has following equipment at home: Single point cane, Walker - 2 wheeled, Marine scientist  OCCUPATION: retired  PLOF: Independent and goes to the gym at least 3x/week  PATIENT GOALS: get back to the gym  NEXT MD VISIT: Feb 27, 2024  OBJECTIVE:  Note: Objective measures were completed at Evaluation unless otherwise noted.  DIAGNOSTIC FINDINGS: n/a  PATIENT SURVEYS:  LEFS 40%  COGNITION: Overall cognitive status: Within functional limits for tasks assessed     SENSATION: WFL  EDEMA:  Circumferential: left mid patella 55cm, right 50 cm  MUSCLE LENGTH:  PALPATION: Swollen, mild warmth , tender to palpation medial and lateral knee  LOWER EXTREMITY ROM:  Active ROM AROM left eval PROM Left eval  Hip flexion    Hip extension    Hip abduction    Hip adduction    Hip internal rotation    Hip external rotation    Knee flexion 90 100  Knee extension 10 5  Ankle dorsiflexion  Ankle plantarflexion    Ankle inversion    Ankle eversion     (Blank rows = not tested)  LOWER EXTREMITY MMT:  MMT Right eval Left eval  Hip flexion    Hip extension    Hip abduction    Hip adduction    Hip internal rotation    Hip external rotation    Knee flexion  4  Knee extension  4  Ankle dorsiflexion    Ankle plantarflexion    Ankle inversion    Ankle eversion     (Blank rows = not tested)  LOWER EXTREMITY SPECIAL TESTS:  Knee special tests: Patella tap test (ballotable patella): positive  and mild quad lag  FUNCTIONAL TESTS:  5 times sit to stand: 29 seconds with need to use hands Timed up and go (TUG): 21 seconds with  SPC  GAIT: Distance walked: 60 feet Assistive device utilized: Single point cane Level of assistance: Modified independence Comments: slow, antalgic on the left                                                                                                                                TREATMENT DATE: 02/10/24 PROM to L knee flex/ext, patellar mobs, distraction LAQ 2# 2x10 HS curls green 2x10 Calf stretch 15s x3 NuStep L5 x63mins Bike L1 x75mins  Step ups 4" STS 2x10   02/06/24 Patellar mobs LLE L knee PROM Nustep L3 LAQ 2x10 Red band HS curl 2x10 Sit to stand from elevated  2x10 Walking 164ft without cane x2  4in step ups 2x10   02/03/24 Evaluation  Vaso, left knee medium pressure 34 degrees   PATIENT EDUCATION:  Education details: HEP/POC Person educated: Patient Education method: Programmer, multimedia, Demonstration, Tactile cues, and Verbal cues Education comprehension: verbalized understanding  HOME EXERCISE PROGRAM: Gave her the seated knee flexion and then scoot forward for increased knee flexion  ASSESSMENT:  CLINICAL IMPRESSION: Pt. Arrived doing well ambulating without and AD. Her L knee flexion is still limited. She has tightness in her calf as well. She is very motivated to get back in the gym and do more exercises, was advised to do but not over do. Pt has some difficulty on the bike, will continue to work on improving L knee ROM. Does well on 4" step today, will keep practicing as she does have 2 8' steps to get into the house. She would benefit from skilled PT to further improve her LLE ROM and strength to be able to perform functional tasks.   OBJECTIVE IMPAIRMENTS: Abnormal gait, cardiopulmonary status limiting activity, decreased activity tolerance, decreased balance, decreased endurance, decreased mobility, difficulty walking, decreased ROM, decreased strength, increased edema, increased fascial restrictions, impaired perceived functional ability, increased  muscle spasms, impaired flexibility, improper body mechanics, postural dysfunction, and pain.   REHAB POTENTIAL: Good  CLINICAL DECISION MAKING: Evolving/moderate complexity  EVALUATION COMPLEXITY: Moderate   GOALS: Goals reviewed with patient? Yes  SHORT  TERM GOALS: Target date: 02/19/24 Independent with initial HEP Baseline: Goal status: MET 02/10/24  LONG TERM GOALS: Target date: 05/04/24  Independent with RICE Baseline:  Goal status: INITIAL  2.  Independent with advanced HEP Baseline:  Goal status: INITIAL  3.  Decrease pain 50% Baseline:  Goal status: INITIAL  4.  Increase AROM to 0-120 degrees flexion Baseline:  Goal status: INITIAL  5.  Walk without device Baseline:  Goal status: INITIAL  6.  Return to the gym Baseline:  Goal status: INITIAL   PLAN:  PT FREQUENCY: 2x/week  PT DURATION: 12 weeks  PLANNED INTERVENTIONS: 97164- PT Re-evaluation, 97110-Therapeutic exercises, 97530- Therapeutic activity, 97112- Neuromuscular re-education, 97535- Self Care, 09811- Manual therapy, [phone]- Gait training, [phone]- Electrical stimulation (unattended), 97016- Vasopneumatic device, Patient/Family education, Balance training, Stair training, Taping, Joint mobilization, Scar mobilization, and Cryotherapy  PLAN FOR NEXT SESSION: work on strength, ROM and function   [employer], PT 02/10/2024, 9:34 AM

## 2024-02-10 ENCOUNTER — Ambulatory Visit

## 2024-02-10 DIAGNOSIS — R262 Difficulty in walking, not elsewhere classified: Secondary | ICD-10-CM

## 2024-02-10 DIAGNOSIS — R600 Localized edema: Secondary | ICD-10-CM

## 2024-02-10 DIAGNOSIS — M25662 Stiffness of left knee, not elsewhere classified: Secondary | ICD-10-CM

## 2024-02-10 DIAGNOSIS — M25562 Pain in left knee: Secondary | ICD-10-CM

## 2024-02-13 ENCOUNTER — Encounter: Payer: Self-pay | Admitting: Physical Therapy

## 2024-02-13 ENCOUNTER — Ambulatory Visit: Admitting: Physical Therapy

## 2024-02-13 DIAGNOSIS — R262 Difficulty in walking, not elsewhere classified: Secondary | ICD-10-CM | POA: Diagnosis not present

## 2024-02-13 DIAGNOSIS — R600 Localized edema: Secondary | ICD-10-CM | POA: Diagnosis not present

## 2024-02-13 DIAGNOSIS — M25562 Pain in left knee: Secondary | ICD-10-CM | POA: Diagnosis not present

## 2024-02-13 DIAGNOSIS — M25662 Stiffness of left knee, not elsewhere classified: Secondary | ICD-10-CM

## 2024-02-13 NOTE — Therapy (Signed)
 OUTPATIENT PHYSICAL THERAPY LOWER EXTREMITY TREATMENT   Patient Name: Sara Nicholson MRN: [id number] DOB:1955/12/19, 68 y.o., female Today's Date: 02/13/2024  END OF SESSION:  PT End of Session - 02/13/24 0849     Visit Number 4    Number of Visits 13    Date for PT Re-Evaluation 05/04/24    Authorization Type [employer]    PT Start Time 0845    PT Stop Time 0944    PT Time Calculation (min) 59 min    Activity Tolerance Patient tolerated treatment well    Behavior During Therapy WFL for tasks assessed/performed              Past Medical History:  Diagnosis Date   Arthritis    Cancer (HCC)    squamous lesion right shoulder   Depression    Hypertension    Neuromuscular disorder (HCC)    Parkinson disease (HCC)    Pneumonia 1988   Past Surgical History:  Procedure Laterality Date   APPENDECTOMY     BREAST EXCISIONAL BIOPSY Left    benign cyst   CESAREAN SECTION     x1   COLONOSCOPY     TOTAL KNEE ARTHROPLASTY Left 01/17/2024   Procedure: ARTHROPLASTY, KNEE, TOTAL;  Surgeon: Arnie Lao, MD;  Location: [facility];  Service: Orthopedics;  Laterality: Left;   Patient Active Problem List   Diagnosis Date Noted   Status post total left knee replacement 01/17/2024   Unilateral primary osteoarthritis, left knee 10/30/2023   Hyperlipidemia 11/08/2022   Class 2 severe obesity due to excess calories with serious comorbidity and body mass index (BMI) of 37.0 to 37.9 in adult [facility]) 11/08/2022   Parkinson's disease (HCC) 01/16/2021    PCP: Pauletta Boroughs, MD  REFERRING PROVIDER: Lucienne Ryder, MD  REFERRING DIAG: S/P left TKA  THERAPY DIAG:  Acute pain of left knee  Stiffness of left knee, not elsewhere classified  Localized edema  Difficulty in walking, not elsewhere classified  Rationale for Evaluation and Treatment: Rehabilitation  ONSET DATE: 01/17/24  SUBJECTIVE:   SUBJECTIVE STATEMENT: Patient come in limping today, she feels that it is tight and sore,  she is limping more today, reports practicing stairs and getting up and down from sitting, rates pain a 2-3/10  PERTINENT HISTORY: PD PAIN:  Are you having pain? Yes: NPRS scale: 0/10 Pain location: left knee  Pain description: ache sore Aggravating factors: walking, bending pain up 4/10 Relieving factors: mm relaxer and ice help can be 0/10  PRECAUTIONS: None  RED FLAGS: None   WEIGHT BEARING RESTRICTIONS: No  FALLS:  Has patient fallen in last 6 months? No  LIVING ENVIRONMENT: Lives with: lives with their family Lives in: House/apartment Stairs: Yes: External: 5 steps; can reach both Has following equipment at home: Single point cane, Walker - 2 wheeled, Marine scientist  OCCUPATION: retired  PLOF: Independent and goes to the gym at least 3x/week  PATIENT GOALS: get back to the gym  NEXT MD VISIT: Feb 27, 2024  OBJECTIVE:  Note: Objective measures were completed at Evaluation unless otherwise noted.  DIAGNOSTIC FINDINGS: n/a  PATIENT SURVEYS:  LEFS 40%  COGNITION: Overall cognitive status: Within functional limits for tasks assessed     SENSATION: WFL  EDEMA:  Circumferential: left mid patella 55cm, right 50 cm  MUSCLE LENGTH:  PALPATION: Swollen, mild warmth , tender to palpation medial and lateral knee  LOWER EXTREMITY ROM:  Active ROM AROM left eval PROM Left eval AROM Left  02/13/24  Hip flexion     Hip extension     Hip abduction     Hip adduction     Hip internal rotation     Hip external rotation     Knee flexion 90 100 99  Knee extension 10 5 7   Ankle dorsiflexion     Ankle plantarflexion     Ankle inversion     Ankle eversion      (Blank rows = not tested)  LOWER EXTREMITY MMT:  MMT Right eval Left eval  Hip flexion    Hip extension    Hip abduction    Hip adduction    Hip internal rotation    Hip external rotation    Knee flexion  4  Knee extension  4  Ankle dorsiflexion    Ankle plantarflexion    Ankle inversion     Ankle eversion     (Blank rows = not tested)  LOWER EXTREMITY SPECIAL TESTS:  Knee special tests: Patella tap test (ballotable patella): positive  and mild quad lag  FUNCTIONAL TESTS:  5 times sit to stand: 29 seconds with need to use hands Timed up and go (TUG): 21 seconds with SPC  GAIT: Distance walked: 60 feet Assistive device utilized: Single point cane Level of assistance: Modified independence Comments: slow, antalgic on the left                                                                                                                                TREATMENT DATE: 02/12/24 Nustep level 4 x 5 minutes Knee is a little more swollen today  Bike level 1 x 5 minutes partial revolutions to start then progressed to full Leg press no weight working on ROM both legs and then left only Feet on ball K2C, small bridge, isometric abs SAQ 3# 2x10 Passive stretch left LE measured ROM as above  02/10/24 PROM to L knee flex/ext, patellar mobs, distraction LAQ 2# 2x10 HS curls green 2x10 Calf stretch 15s x3 NuStep L5 x15mins Bike L1 x80mins  Step ups 4" STS 2x10   02/06/24 Patellar mobs LLE L knee PROM Nustep L3 LAQ 2x10 Red band HS curl 2x10 Sit to stand from elevated  2x10 Walking 148ft without cane x2  4in step ups 2x10   02/03/24 Evaluation  Vaso, left knee medium pressure 34 degrees   PATIENT EDUCATION:  Education details: HEP/POC Person educated: Patient Education method: Programmer, multimedia, Demonstration, Tactile cues, and Verbal cues Education comprehension: verbalized understanding  HOME EXERCISE PROGRAM: Gave her the seated knee flexion and then scoot forward for increased knee flexion  ASSESSMENT:  CLINICAL IMPRESSION: Patient with no device today, she is limping Pt. Arrived doing well ambulating without and AD. Her L knee flexion is still limited. She reports that she did a lot of exercise yesterday and some shopping so is reporting pain, reports  difficulty getting up from sitting without arm rests.  She  has improved ROM as noted aboveShe would benefit from skilled PT to further improve her LLE ROM and strength to be able to perform functional tasks.   OBJECTIVE IMPAIRMENTS: Abnormal gait, cardiopulmonary status limiting activity, decreased activity tolerance, decreased balance, decreased endurance, decreased mobility, difficulty walking, decreased ROM, decreased strength, increased edema, increased fascial restrictions, impaired perceived functional ability, increased muscle spasms, impaired flexibility, improper body mechanics, postural dysfunction, and pain.   REHAB POTENTIAL: Good  CLINICAL DECISION MAKING: Evolving/moderate complexity  EVALUATION COMPLEXITY: Moderate   GOALS: Goals reviewed with patient? Yes  SHORT TERM GOALS: Target date: 02/19/24 Independent with initial HEP Baseline: Goal status: MET 02/10/24  LONG TERM GOALS: Target date: 05/04/24  Independent with RICE Baseline:  Goal status: met 02/13/24  2.  Independent with advanced HEP Baseline:  Goal status: INITIAL  3.  Decrease pain 50% Baseline:  Goal status: INITIAL  4.  Increase AROM to 0-120 degrees flexion Baseline:  Goal status: ongoing 02/13/24  5.  Walk without device Baseline:  Goal status: INITIAL  6.  Return to the gym Baseline:  Goal status: INITIAL   PLAN:  PT FREQUENCY: 2x/week  PT DURATION: 12 weeks  PLANNED INTERVENTIONS: 97164- PT Re-evaluation, 97110-Therapeutic exercises, 97530- Therapeutic activity, 97112- Neuromuscular re-education, 97535- Self Care, 01093- Manual therapy, [phone]- Gait training, [phone]- Electrical stimulation (unattended), 97016- Vasopneumatic device, Patient/Family education, Balance training, Stair training, Taping, Joint mobilization, Scar mobilization, and Cryotherapy  PLAN FOR NEXT SESSION: work on strength, ROM and function   Himani Corona W, PT 02/13/2024, 8:50 AM

## 2024-02-17 ENCOUNTER — Ambulatory Visit: Admitting: Physical Therapy

## 2024-02-17 ENCOUNTER — Encounter: Payer: Self-pay | Admitting: Physical Therapy

## 2024-02-17 DIAGNOSIS — M25562 Pain in left knee: Secondary | ICD-10-CM

## 2024-02-17 DIAGNOSIS — M25662 Stiffness of left knee, not elsewhere classified: Secondary | ICD-10-CM | POA: Diagnosis not present

## 2024-02-17 DIAGNOSIS — R262 Difficulty in walking, not elsewhere classified: Secondary | ICD-10-CM

## 2024-02-17 DIAGNOSIS — R600 Localized edema: Secondary | ICD-10-CM | POA: Diagnosis not present

## 2024-02-17 NOTE — Therapy (Signed)
 OUTPATIENT PHYSICAL THERAPY LOWER EXTREMITY TREATMENT   Patient Name: MARGORIE OTWELL MRN: [id number] DOB:1956-09-04, 68 y.o., female Today's Date: 02/17/2024  END OF SESSION:  PT End of Session - 02/17/24 0805     Visit Number 5    Date for PT Re-Evaluation 05/04/24    PT Start Time 0804    PT Stop Time 0930    PT Time Calculation (min) 86 min    Activity Tolerance Patient tolerated treatment well    Behavior During Therapy WFL for tasks assessed/performed              Past Medical History:  Diagnosis Date   Arthritis    Cancer (HCC)    squamous lesion right shoulder   Depression    Hypertension    Neuromuscular disorder (HCC)    Parkinson disease (HCC)    Pneumonia 1988   Past Surgical History:  Procedure Laterality Date   APPENDECTOMY     BREAST EXCISIONAL BIOPSY Left    benign cyst   CESAREAN SECTION     x1   COLONOSCOPY     TOTAL KNEE ARTHROPLASTY Left 01/17/2024   Procedure: ARTHROPLASTY, KNEE, TOTAL;  Surgeon: Arnie Lao, MD;  Location: [facility];  Service: Orthopedics;  Laterality: Left;   Patient Active Problem List   Diagnosis Date Noted   Status post total left knee replacement 01/17/2024   Unilateral primary osteoarthritis, left knee 10/30/2023   Hyperlipidemia 11/08/2022   Class 2 severe obesity due to excess calories with serious comorbidity and body mass index (BMI) of 37.0 to 37.9 in adult [facility]) 11/08/2022   Parkinson's disease (HCC) 01/16/2021    PCP: Pauletta Boroughs, MD  REFERRING PROVIDER: Lucienne Ryder, MD  REFERRING DIAG: S/P left TKA  THERAPY DIAG:  Acute pain of left knee  Stiffness of left knee, not elsewhere classified  Localized edema  Difficulty in walking, not elsewhere classified  Rationale for Evaluation and Treatment: Rehabilitation  ONSET DATE: 01/17/24  SUBJECTIVE:   SUBJECTIVE STATEMENT: "I feel good, I got up out of the chair by myself"  PERTINENT HISTORY: PD PAIN:  Are you having pain? Yes: NPRS scale:  .5/10 Pain location: left knee  Pain description: ache sore Aggravating factors: walking, bending pain up 4/10 Relieving factors: mm relaxer and ice help can be 0/10  PRECAUTIONS: None  RED FLAGS: None   WEIGHT BEARING RESTRICTIONS: No  FALLS:  Has patient fallen in last 6 months? No  LIVING ENVIRONMENT: Lives with: lives with their family Lives in: House/apartment Stairs: Yes: External: 5 steps; can reach both Has following equipment at home: Single point cane, Walker - 2 wheeled, Marine scientist  OCCUPATION: retired  PLOF: Independent and goes to the gym at least 3x/week  PATIENT GOALS: get back to the gym  NEXT MD VISIT: Feb 27, 2024  OBJECTIVE:  Note: Objective measures were completed at Evaluation unless otherwise noted.  DIAGNOSTIC FINDINGS: n/a  PATIENT SURVEYS:  LEFS 40%  COGNITION: Overall cognitive status: Within functional limits for tasks assessed     SENSATION: WFL  EDEMA:  Circumferential: left mid patella 55cm, right 50 cm  MUSCLE LENGTH:  PALPATION: Swollen, mild warmth , tender to palpation medial and lateral knee  LOWER EXTREMITY ROM:  Active ROM AROM left eval PROM Left eval AROM Left  02/13/24  Hip flexion     Hip extension     Hip abduction     Hip adduction     Hip internal rotation  Hip external rotation     Knee flexion 90 100 99  Knee extension 10 5 7   Ankle dorsiflexion     Ankle plantarflexion     Ankle inversion     Ankle eversion      (Blank rows = not tested)  LOWER EXTREMITY MMT:  MMT Right eval Left eval  Hip flexion    Hip extension    Hip abduction    Hip adduction    Hip internal rotation    Hip external rotation    Knee flexion  4  Knee extension  4  Ankle dorsiflexion    Ankle plantarflexion    Ankle inversion    Ankle eversion     (Blank rows = not tested)  LOWER EXTREMITY SPECIAL TESTS:  Knee special tests: Patella tap test (ballotable patella): positive  and mild quad  lag  FUNCTIONAL TESTS:  5 times sit to stand: 29 seconds with need to use hands Timed up and go (TUG): 21 seconds with SPC  GAIT: Distance walked: 60 feet Assistive device utilized: Single point cane Level of assistance: Modified independence Comments: slow, antalgic on the left                                                                                                                                TREATMENT DATE: 02/17/24 Nustep level 5 x 6 minutes L knee PROM with end range holds   L tibiofemoral and patellar mobs S2S 2x10 LAQ LLE 3lb 2x10 LLE HS curls green 2x10 4in step ups x10 each Heel raises black bar 2x10 Leg press 20lb 2x10  Lateral 4 in step ups x 10 each   02/12/24 Nustep level 4 x 5 minutes Knee is a little more swollen today  Bike level 1 x 5 minutes partial revolutions to start then progressed to full Leg press no weight working on ROM both legs and then left only Feet on ball K2C, small bridge, isometric abs SAQ 3# 2x10 Passive stretch left LE measured ROM as above  02/10/24 PROM to L knee flex/ext, patellar mobs, distraction LAQ 2# 2x10 HS curls green 2x10 Calf stretch 15s x3 NuStep L5 x29mins Bike L1 x33mins  Step ups 4" STS 2x10   02/06/24 Patellar mobs LLE L knee PROM Nustep L3 LAQ 2x10 Red band HS curl 2x10 Sit to stand from elevated  2x10 Walking 185ft without cane x2  4in step ups 2x10   02/03/24 Evaluation  Vaso, left knee medium pressure 34 degrees   PATIENT EDUCATION:  Education details: HEP/POC Person educated: Patient Education method: Programmer, multimedia, Demonstration, Tactile cues, and Verbal cues Education comprehension: verbalized understanding  HOME EXERCISE PROGRAM: Gave her the seated knee flexion and then scoot forward for increased knee flexion  ASSESSMENT:  CLINICAL IMPRESSION: Pt. arrived doing well ambulating without and AD. Her L knee flexion is still limited. Continues to progress with L knee strength and ROM.  Some compensation with  all functional interventions. Increase resistance tolerated with leg press. No pain during session. She would benefit from skilled PT to further improve her LLE ROM and strength to be able to perform functional tasks.   OBJECTIVE IMPAIRMENTS: Abnormal gait, cardiopulmonary status limiting activity, decreased activity tolerance, decreased balance, decreased endurance, decreased mobility, difficulty walking, decreased ROM, decreased strength, increased edema, increased fascial restrictions, impaired perceived functional ability, increased muscle spasms, impaired flexibility, improper body mechanics, postural dysfunction, and pain.   REHAB POTENTIAL: Good  CLINICAL DECISION MAKING: Evolving/moderate complexity  EVALUATION COMPLEXITY: Moderate   GOALS: Goals reviewed with patient? Yes  SHORT TERM GOALS: Target date: 02/19/24 Independent with initial HEP Baseline: Goal status: MET 02/10/24  LONG TERM GOALS: Target date: 05/04/24  Independent with RICE Baseline:  Goal status: met 02/13/24  2.  Independent with advanced HEP Baseline:  Goal status: INITIAL  3.  Decrease pain 50% Baseline:  Goal status: INITIAL  4.  Increase AROM to 0-120 degrees flexion Baseline:  Goal status: ongoing 02/13/24  5.  Walk without device Baseline:  Goal status: INITIAL  6.  Return to the gym Baseline:  Goal status: INITIAL   PLAN:  PT FREQUENCY: 2x/week  PT DURATION: 12 weeks  PLANNED INTERVENTIONS: 97164- PT Re-evaluation, 97110-Therapeutic exercises, 97530- Therapeutic activity, 97112- Neuromuscular re-education, 97535- Self Care, 86578- Manual therapy, [phone]- Gait training, [phone]- Electrical stimulation (unattended), 97016- Vasopneumatic device, Patient/Family education, Balance training, Stair training, Taping, Joint mobilization, Scar mobilization, and Cryotherapy  PLAN FOR NEXT SESSION: work on strength, ROM and function   Ollen Beverage, PTA 02/17/2024, 8:05 AM

## 2024-02-20 ENCOUNTER — Ambulatory Visit: Admitting: Physical Therapy

## 2024-02-20 ENCOUNTER — Encounter: Payer: Self-pay | Admitting: Physical Therapy

## 2024-02-20 DIAGNOSIS — R600 Localized edema: Secondary | ICD-10-CM

## 2024-02-20 DIAGNOSIS — M25562 Pain in left knee: Secondary | ICD-10-CM

## 2024-02-20 DIAGNOSIS — M25662 Stiffness of left knee, not elsewhere classified: Secondary | ICD-10-CM | POA: Diagnosis not present

## 2024-02-20 DIAGNOSIS — R262 Difficulty in walking, not elsewhere classified: Secondary | ICD-10-CM

## 2024-02-20 NOTE — Therapy (Signed)
 OUTPATIENT PHYSICAL THERAPY LOWER EXTREMITY TREATMENT   Patient Name: Sara Nicholson MRN: [id number] DOB:1956-07-06, 68 y.o., female Today's Date: 02/20/2024  END OF SESSION:  PT End of Session - 02/20/24 0801     Visit Number 6    Date for PT Re-Evaluation 05/04/24    PT Start Time 0800    PT Stop Time 0845    PT Time Calculation (min) 45 min    Activity Tolerance Patient tolerated treatment well    Behavior During Therapy [facility] for tasks assessed/performed              Past Medical History:  Diagnosis Date   Arthritis    Cancer (HCC)    squamous lesion right shoulder   Depression    Hypertension    Neuromuscular disorder (HCC)    Parkinson disease (HCC)    Pneumonia 1988   Past Surgical History:  Procedure Laterality Date   APPENDECTOMY     BREAST EXCISIONAL BIOPSY Left    benign cyst   CESAREAN SECTION     x1   COLONOSCOPY     TOTAL KNEE ARTHROPLASTY Left 01/17/2024   Procedure: ARTHROPLASTY, KNEE, TOTAL;  Surgeon: Arnie Lao, MD;  Location: [facility];  Service: Orthopedics;  Laterality: Left;   Patient Active Problem List   Diagnosis Date Noted   Status post total left knee replacement 01/17/2024   Unilateral primary osteoarthritis, left knee 10/30/2023   Hyperlipidemia 11/08/2022   Class 2 severe obesity due to excess calories with serious comorbidity and body mass index (BMI) of 37.0 to 37.9 in adult [facility]) 11/08/2022   Parkinson's disease (HCC) 01/16/2021    PCP: Pauletta Boroughs, MD  REFERRING PROVIDER: Lucienne Ryder, MD  REFERRING DIAG: S/P left TKA  THERAPY DIAG:  Acute pain of left knee  Stiffness of left knee, not elsewhere classified  Difficulty in walking, not elsewhere classified  Localized edema  Rationale for Evaluation and Treatment: Rehabilitation  ONSET DATE: 01/17/24  SUBJECTIVE:   SUBJECTIVE STATEMENT: "I feel like I had a break through" Less pain more mobility   PERTINENT HISTORY: PD PAIN:  Are you having pain? Yes: NPRS  scale: 0/10 Pain location: left knee  Pain description: ache sore Aggravating factors: walking, bending pain up 4/10 Relieving factors: mm relaxer and ice help can b0e 0/10  PRECAUTIONS: None  RED FLAGS: None   WEIGHT BEARING RESTRICTIONS: No  FALLS:  Has patient fallen in last 6 months? No  LIVING ENVIRONMENT: Lives with: lives with their family Lives in: House/apartment Stairs: Yes: External: 5 steps; can reach both Has following equipment at home: Single point cane, Walker - 2 wheeled, Marine scientist  OCCUPATION: retired  PLOF: Independent and goes to the gym at least 3x/week  PATIENT GOALS: get back to the gym  NEXT MD VISIT: Feb 27, 2024  OBJECTIVE:  Note: Objective measures were completed at Evaluation unless otherwise noted.  DIAGNOSTIC FINDINGS: n/a  PATIENT SURVEYS:  LEFS 40%  COGNITION: Overall cognitive status: Within functional limits for tasks assessed     SENSATION: WFL  EDEMA:  Circumferential: left mid patella 55cm, right 50 cm  MUSCLE LENGTH:  PALPATION: Swollen, mild warmth , tender to palpation medial and lateral knee  LOWER EXTREMITY ROM:  Active ROM AROM left eval PROM Left eval AROM Left  02/13/24 AROM Left 02/20/24  Hip flexion      Hip extension      Hip abduction      Hip adduction  Hip internal rotation      Hip external rotation      Knee flexion 90 100 99 108  Knee extension 10 5 7 4   Ankle dorsiflexion      Ankle plantarflexion      Ankle inversion      Ankle eversion       (Blank rows = not tested)  LOWER EXTREMITY MMT:  MMT Right eval Left eval  Hip flexion    Hip extension    Hip abduction    Hip adduction    Hip internal rotation    Hip external rotation    Knee flexion  4  Knee extension  4  Ankle dorsiflexion    Ankle plantarflexion    Ankle inversion    Ankle eversion     (Blank rows = not tested)  LOWER EXTREMITY SPECIAL TESTS:  Knee special tests: Patella tap test (ballotable  patella): positive  and mild quad lag  FUNCTIONAL TESTS:  5 times sit to stand: 29 seconds with need to use hands Timed up and go (TUG): 21 seconds with SPC  GAIT: Distance walked: 60 feet Assistive device utilized: Single point cane Level of assistance: Modified independence Comments: slow, antalgic on the left                                                                                                                                TREATMENT DATE: 02/20/24 Bike L 2 x 6 min L knee PROM with end range holds   L tibiofemoral and patellar mobs Sit to stand holding yellow ball 2x10 HS curls 25lb 2x10 Leg Ext 10lb 2x10 Leg press 40lb 2x10 6in step ups 2x10each LAQ  LLE 3lb 2x10 HS curls green 2x10  02/17/24 Nustep level 5 x 6 minutes L knee PROM with end range holds   L tibiofemoral and patellar mobs S2S 2x10 LAQ LLE 3lb 2x10 LLE HS curls green 2x10 4in step ups x10 each Heel raises black bar 2x10 Leg press 20lb 2x10  Lateral 4 in step ups x 10 each   02/12/24 Nustep level 4 x 5 minutes Knee is a little more swollen today  Bike level 1 x 5 minutes partial revolutions to start then progressed to full Leg press no weight working on ROM both legs and then left only Feet on ball K2C, small bridge, isometric abs SAQ 3# 2x10 Passive stretch left LE measured ROM as above  02/10/24 PROM to L knee flex/ext, patellar mobs, distraction LAQ 2# 2x10 HS curls green 2x10 Calf stretch 15s x3 NuStep L5 x32mins Bike L1 x36mins  Step ups 4" STS 2x10   02/06/24 Patellar mobs LLE L knee PROM Nustep L3 LAQ 2x10 Red band HS curl 2x10 Sit to stand from elevated  2x10 Walking 171ft without cane x2  4in step ups 2x10   02/03/24 Evaluation  Vaso, left knee medium pressure 34 degrees   PATIENT EDUCATION:  Education  details: HEP/POC Person educated: Patient Education method: Explanation, Demonstration, Tactile cues, and Verbal cues Education comprehension: verbalized  understanding  HOME EXERCISE PROGRAM: Gave her the seated knee flexion and then scoot forward for increased knee flexion  ASSESSMENT:  CLINICAL IMPRESSION: Pt. arrived doing well ambulating without and AD. She has progressed increasing her R knee AROM. Continues to progress with L knee strength and ROM. Some compensation with all functional interventions remians. Increase resistance tolerated with leg press. No issues was a prigressing to more machine level interventions.  No pain during session. She would benefit from skilled PT to further improve her LLE ROM and strength to be able to perform functional tasks.   OBJECTIVE IMPAIRMENTS: Abnormal gait, cardiopulmonary status limiting activity, decreased activity tolerance, decreased balance, decreased endurance, decreased mobility, difficulty walking, decreased ROM, decreased strength, increased edema, increased fascial restrictions, impaired perceived functional ability, increased muscle spasms, impaired flexibility, improper body mechanics, postural dysfunction, and pain.   REHAB POTENTIAL: Good  CLINICAL DECISION MAKING: Evolving/moderate complexity  EVALUATION COMPLEXITY: Moderate   GOALS: Goals reviewed with patient? Yes  SHORT TERM GOALS: Target date: 02/19/24 Independent with initial HEP Baseline: Goal status: MET 02/10/24  LONG TERM GOALS: Target date: 05/04/24  Independent with RICE Baseline:  Goal status: met 02/13/24  2.  Independent with advanced HEP Baseline:  Goal status: INITIAL  3.  Decrease pain 50% Baseline:  Goal status: INITIAL  4.  Increase AROM to 0-120 degrees flexion Baseline:  Goal status: ongoing 02/13/24, Progressing 02/20/24  5.  Walk without device Baseline:  Goal status: INITIAL  6.  Return to the gym Baseline:  Goal status: Progressing 02/20/24   PLAN:  PT FREQUENCY: 2x/week  PT DURATION: 12 weeks  PLANNED INTERVENTIONS: 97164- PT Re-evaluation, 97110-Therapeutic exercises, 97530-  Therapeutic activity, 97112- Neuromuscular re-education, 97535- Self Care, 96045- Manual therapy, [phone]- Gait training, [phone]- Electrical stimulation (unattended), 97016- Vasopneumatic device, Patient/Family education, Balance training, Stair training, Taping, Joint mobilization, Scar mobilization, and Cryotherapy  PLAN FOR NEXT SESSION: work on strength, ROM and function   Ollen Beverage, PTA 02/20/2024, 8:01 AM

## 2024-02-24 ENCOUNTER — Ambulatory Visit: Admitting: Physical Therapy

## 2024-02-24 ENCOUNTER — Encounter: Payer: Self-pay | Admitting: Physical Therapy

## 2024-02-24 DIAGNOSIS — M25562 Pain in left knee: Secondary | ICD-10-CM

## 2024-02-24 DIAGNOSIS — R262 Difficulty in walking, not elsewhere classified: Secondary | ICD-10-CM

## 2024-02-24 DIAGNOSIS — M25662 Stiffness of left knee, not elsewhere classified: Secondary | ICD-10-CM

## 2024-02-24 DIAGNOSIS — R600 Localized edema: Secondary | ICD-10-CM

## 2024-02-24 NOTE — Addendum Note (Signed)
 Addended by: Hollis Lurie on: 02/24/2024 01:01 PM   Modules accepted: Orders

## 2024-02-24 NOTE — Therapy (Signed)
 OUTPATIENT PHYSICAL THERAPY LOWER EXTREMITY TREATMENT   Patient Name: Sara Nicholson MRN: [id number] DOB:09-Nov-1955, 68 y.o., female Today's Date: 02/24/2024  END OF SESSION:  PT End of Session - 02/24/24 1145     Visit Number 7    Date for PT Re-Evaluation 05/04/24    PT Start Time 1145    PT Stop Time 1230    PT Time Calculation (min) 45 min    Activity Tolerance Patient tolerated treatment well    Behavior During Therapy WFL for tasks assessed/performed              Past Medical History:  Diagnosis Date   Arthritis    Cancer (HCC)    squamous lesion right shoulder   Depression    Hypertension    Neuromuscular disorder (HCC)    Parkinson disease (HCC)    Pneumonia 1988   Past Surgical History:  Procedure Laterality Date   APPENDECTOMY     BREAST EXCISIONAL BIOPSY Left    benign cyst   CESAREAN SECTION     x1   COLONOSCOPY     TOTAL KNEE ARTHROPLASTY Left 01/17/2024   Procedure: ARTHROPLASTY, KNEE, TOTAL;  Surgeon: Arnie Lao, MD;  Location: [facility];  Service: Orthopedics;  Laterality: Left;   Patient Active Problem List   Diagnosis Date Noted   Status post total left knee replacement 01/17/2024   Unilateral primary osteoarthritis, left knee 10/30/2023   Hyperlipidemia 11/08/2022   Class 2 severe obesity due to excess calories with serious comorbidity and body mass index (BMI) of 37.0 to 37.9 in adult [facility]) 11/08/2022   Parkinson's disease (HCC) 01/16/2021    PCP: Pauletta Boroughs, MD  REFERRING PROVIDER: Lucienne Ryder, MD  REFERRING DIAG: S/P left TKA  THERAPY DIAG:  Acute pain of left knee  Stiffness of left knee, not elsewhere classified  Difficulty in walking, not elsewhere classified  Localized edema  Rationale for Evaluation and Treatment: Rehabilitation  ONSET DATE: 01/17/24  SUBJECTIVE:   SUBJECTIVE STATEMENT: ""Feeling good, muscles are a little sore'  PERTINENT HISTORY: PD PAIN:  Are you having pain? Yes: NPRS scale: 0/10 Pain  location: left knee  Pain description: ache sore Aggravating factors: walking, bending pain up 4/10 Relieving factors: mm relaxer and ice help can b0e 0/10  PRECAUTIONS: None  RED FLAGS: None   WEIGHT BEARING RESTRICTIONS: No  FALLS:  Has patient fallen in last 6 months? No  LIVING ENVIRONMENT: Lives with: lives with their family Lives in: House/apartment Stairs: Yes: External: 5 steps; can reach both Has following equipment at home: Single point cane, Walker - 2 wheeled, Marine scientist  OCCUPATION: retired  PLOF: Independent and goes to the gym at least 3x/week  PATIENT GOALS: get back to the gym  NEXT MD VISIT: Feb 27, 2024  OBJECTIVE:  Note: Objective measures were completed at Evaluation unless otherwise noted.  DIAGNOSTIC FINDINGS: n/a  PATIENT SURVEYS:  LEFS 40%  COGNITION: Overall cognitive status: Within functional limits for tasks assessed     SENSATION: WFL  EDEMA:  Circumferential: left mid patella 55cm, right 50 cm  MUSCLE LENGTH:  PALPATION: Swollen, mild warmth , tender to palpation medial and lateral knee  LOWER EXTREMITY ROM:  Active ROM AROM left eval PROM Left eval AROM Left  02/13/24 AROM Left 02/20/24  Hip flexion      Hip extension      Hip abduction      Hip adduction      Hip internal rotation  Hip external rotation      Knee flexion 90 100 99 108  Knee extension 10 5 7 4   Ankle dorsiflexion      Ankle plantarflexion      Ankle inversion      Ankle eversion       (Blank rows = not tested)  LOWER EXTREMITY MMT:  MMT Right eval Left eval  Hip flexion    Hip extension    Hip abduction    Hip adduction    Hip internal rotation    Hip external rotation    Knee flexion  4  Knee extension  4  Ankle dorsiflexion    Ankle plantarflexion    Ankle inversion    Ankle eversion     (Blank rows = not tested)  LOWER EXTREMITY SPECIAL TESTS:  Knee special tests: Patella tap test (ballotable patella): positive  and  mild quad lag  FUNCTIONAL TESTS:  5 times sit to stand: 29 seconds with need to use hands Timed up and go (TUG): 21 seconds with SPC  GAIT: Distance walked: 60 feet Assistive device utilized: Single point cane Level of assistance: Modified independence Comments: slow, antalgic on the left                                                                                                                                TREATMENT DATE: 02/24/24 NuStep L 5 x 6 min 30lb Resisted gait 4 way x4 each 6 in step ups Lle x10, x5 Leg press 40lb 2x10, LLE 20lb 2x5 L knee PROM with end range holds   L tibiofemoral and patellar mobs LAQ  LLE 3lb 2x10  02/20/24 Bike L 2 x 6 min L knee PROM with end range holds   L tibiofemoral and patellar mobs Sit to stand holding yellow ball 2x10 HS curls 25lb 2x10 Leg Ext 10lb 2x10 Leg press 40lb 2x10 6in step ups 2x10each LAQ  LLE 3lb 2x10 HS curls green 2x10  02/17/24 Nustep level 5 x 6 minutes L knee PROM with end range holds   L tibiofemoral and patellar mobs S2S 2x10 LAQ LLE 3lb 2x10 LLE HS curls green 2x10 4in step ups x10 each Heel raises black bar 2x10 Leg press 20lb 2x10  Lateral 4 in step ups x 10 each   02/12/24 Nustep level 4 x 5 minutes Knee is a little more swollen today  Bike level 1 x 5 minutes partial revolutions to start then progressed to full Leg press no weight working on ROM both legs and then left only Feet on ball K2C, small bridge, isometric abs SAQ 3# 2x10 Passive stretch left LE measured ROM as above  02/10/24 PROM to L knee flex/ext, patellar mobs, distraction LAQ 2# 2x10 HS curls green 2x10 Calf stretch 15s x3 NuStep L5 x75mins Bike L1 x35mins  Step ups 4" STS 2x10   02/06/24 Patellar mobs LLE L knee PROM Nustep L3 LAQ 2x10  Red band HS curl 2x10 Sit to stand from elevated  2x10 Walking 132ft without cane x2  4in step ups 2x10   02/03/24 Evaluation  Vaso, left knee medium pressure 34  degrees   PATIENT EDUCATION:  Education details: HEP/POC Person educated: Patient Education method: Programmer, multimedia, Demonstration, Tactile cues, and Verbal cues Education comprehension: verbalized understanding  HOME EXERCISE PROGRAM: Gave her the seated knee flexion and then scoot forward for increased knee flexion  ASSESSMENT:  CLINICAL IMPRESSION:  arrived doing well ambulating without and AD. Continues to progress with L knee strength and ROM. Some compensation LLE step ups due to weakness. Progressed to SL on leg press without issue. No issues was a prigressing to more machine level interventions.  No pain during session. She would benefit from skilled PT to further improve her LLE ROM and strength to be able to perform functional tasks.   OBJECTIVE IMPAIRMENTS: Abnormal gait, cardiopulmonary status limiting activity, decreased activity tolerance, decreased balance, decreased endurance, decreased mobility, difficulty walking, decreased ROM, decreased strength, increased edema, increased fascial restrictions, impaired perceived functional ability, increased muscle spasms, impaired flexibility, improper body mechanics, postural dysfunction, and pain.   REHAB POTENTIAL: Good  CLINICAL DECISION MAKING: Evolving/moderate complexity  EVALUATION COMPLEXITY: Moderate   GOALS: Goals reviewed with patient? Yes  SHORT TERM GOALS: Target date: 02/19/24 Independent with initial HEP Baseline: Goal status: MET 02/10/24  LONG TERM GOALS: Target date: 05/04/24  Independent with RICE Baseline:  Goal status: met 02/13/24  2.  Independent with advanced HEP Baseline:  Goal status: INITIAL  3.  Decrease pain 50% Baseline:  Goal status: INITIAL  4.  Increase AROM to 0-120 degrees flexion Baseline:  Goal status: ongoing 02/13/24, Progressing 02/20/24  5.  Walk without device Baseline:  Goal status: INITIAL  6.  Return to the gym Baseline:  Goal status: Progressing 02/20/24   PLAN:  PT  FREQUENCY: 2x/week  PT DURATION: 12 weeks  PLANNED INTERVENTIONS: 97164- PT Re-evaluation, 97110-Therapeutic exercises, 97530- Therapeutic activity, 97112- Neuromuscular re-education, 97535- Self Care, 78295- Manual therapy, [phone]- Gait training, [phone]- Electrical stimulation (unattended), 97016- Vasopneumatic device, Patient/Family education, Balance training, Stair training, Taping, Joint mobilization, Scar mobilization, and Cryotherapy  PLAN FOR NEXT SESSION: work on strength, ROM and function   Ollen Beverage, PTA 02/24/2024, 11:47 AM

## 2024-02-25 ENCOUNTER — Other Ambulatory Visit: Payer: Self-pay | Admitting: Neurology

## 2024-02-27 ENCOUNTER — Ambulatory Visit: Admitting: Orthopaedic Surgery

## 2024-02-27 ENCOUNTER — Ambulatory Visit

## 2024-03-03 NOTE — Therapy (Signed)
 OUTPATIENT PHYSICAL THERAPY LOWER EXTREMITY TREATMENT   Patient Name: Sara Nicholson MRN: [id number] DOB:1955/11/02, 68 y.o., female Today's Date: 03/04/2024  END OF SESSION:  PT End of Session - 03/04/24 0759     Visit Number 8    Date for PT Re-Evaluation 05/04/24    PT Start Time 0800    PT Stop Time 0845    PT Time Calculation (min) 45 min    Activity Tolerance Patient tolerated treatment well    Behavior During Therapy [facility] for tasks assessed/performed               Past Medical History:  Diagnosis Date   Arthritis    Cancer (HCC)    squamous lesion right shoulder   Depression    Hypertension    Neuromuscular disorder (HCC)    Parkinson disease (HCC)    Pneumonia 1988   Past Surgical History:  Procedure Laterality Date   APPENDECTOMY     BREAST EXCISIONAL BIOPSY Left    benign cyst   CESAREAN SECTION     x1   COLONOSCOPY     TOTAL KNEE ARTHROPLASTY Left 01/17/2024   Procedure: ARTHROPLASTY, KNEE, TOTAL;  Surgeon: Arnie Lao, MD;  Location: [facility];  Service: Orthopedics;  Laterality: Left;   Patient Active Problem List   Diagnosis Date Noted   Status post total left knee replacement 01/17/2024   Unilateral primary osteoarthritis, left knee 10/30/2023   Hyperlipidemia 11/08/2022   Class 2 severe obesity due to excess calories with serious comorbidity and body mass index (BMI) of 37.0 to 37.9 in adult [facility]) 11/08/2022   Parkinson's disease (HCC) 01/16/2021    PCP: Pauletta Boroughs, MD  REFERRING PROVIDER: Lucienne Ryder, MD  REFERRING DIAG: S/P left TKA  THERAPY DIAG:  Acute pain of left knee  Stiffness of left knee, not elsewhere classified  Difficulty in walking, not elsewhere classified  Localized edema  Rationale for Evaluation and Treatment: Rehabilitation  ONSET DATE: 01/17/24  SUBJECTIVE:   SUBJECTIVE STATEMENT: "The right one is bothering me now, the left one feels fine"   PERTINENT HISTORY: PD PAIN:  Are you having pain? Yes:  NPRS scale: 0/10 Pain location: left knee  Pain description: ache sore Aggravating factors: walking, bending pain up 4/10 Relieving factors: mm relaxer and ice help can b0e 0/10  PRECAUTIONS: None  RED FLAGS: None   WEIGHT BEARING RESTRICTIONS: No  FALLS:  Has patient fallen in last 6 months? No  LIVING ENVIRONMENT: Lives with: lives with their family Lives in: House/apartment Stairs: Yes: External: 5 steps; can reach both Has following equipment at home: Single point cane, Walker - 2 wheeled, Marine scientist  OCCUPATION: retired  PLOF: Independent and goes to the gym at least 3x/week  PATIENT GOALS: get back to the gym  NEXT MD VISIT: Feb 27, 2024  OBJECTIVE:  Note: Objective measures were completed at Evaluation unless otherwise noted.  DIAGNOSTIC FINDINGS: n/a  PATIENT SURVEYS:  LEFS 40%  COGNITION: Overall cognitive status: Within functional limits for tasks assessed     SENSATION: WFL  EDEMA:  Circumferential: left mid patella 55cm, right 50 cm  MUSCLE LENGTH:  PALPATION: Swollen, mild warmth , tender to palpation medial and lateral knee  LOWER EXTREMITY ROM:  Active ROM AROM left eval PROM Left eval AROM Left  02/13/24 AROM Left 02/20/24  Hip flexion      Hip extension      Hip abduction      Hip adduction  Hip internal rotation      Hip external rotation      Knee flexion 90 100 99 108  Knee extension 10 5 7 4   Ankle dorsiflexion      Ankle plantarflexion      Ankle inversion      Ankle eversion       (Blank rows = not tested)  LOWER EXTREMITY MMT:  MMT Right eval Left eval  Hip flexion    Hip extension    Hip abduction    Hip adduction    Hip internal rotation    Hip external rotation    Knee flexion  4  Knee extension  4  Ankle dorsiflexion    Ankle plantarflexion    Ankle inversion    Ankle eversion     (Blank rows = not tested)  LOWER EXTREMITY SPECIAL TESTS:  Knee special tests: Patella tap test (ballotable  patella): positive  and mild quad lag  FUNCTIONAL TESTS:  5 times sit to stand: 29 seconds with need to use hands Timed up and go (TUG): 21 seconds with SPC  GAIT: Distance walked: 60 feet Assistive device utilized: Single point cane Level of assistance: Modified independence Comments: slow, antalgic on the left                                                                                                                                TREATMENT DATE: 03/04/24 Bike L2 x29mins  Calf stretch 30s x2 Step ups 6"  Floor transfer  HS curls 35lb 2x10, LLE 20# x10  Leg Ext 15lb 2x10, LLE 5# x10 Leg press 40# 2x10, LLE 20# x10 Chair squats x10   02/24/24 NuStep L 5 x 6 min 30lb Resisted gait 4 way x4 each 6 in step ups Lle x10, x5 Leg press 40lb 2x10, LLE 20lb 2x5 L knee PROM with end range holds   L tibiofemoral and patellar mobs LAQ  LLE 3lb 2x10  02/20/24 Bike L 2 x 6 min L knee PROM with end range holds   L tibiofemoral and patellar mobs Sit to stand holding yellow ball 2x10 HS curls 25lb 2x10 Leg Ext 10lb 2x10 Leg press 40lb 2x10 6in step ups 2x10each LAQ  LLE 3lb 2x10 HS curls green 2x10  02/17/24 Nustep level 5 x 6 minutes L knee PROM with end range holds   L tibiofemoral and patellar mobs S2S 2x10 LAQ LLE 3lb 2x10 LLE HS curls green 2x10 4in step ups x10 each Heel raises black bar 2x10 Leg press 20lb 2x10  Lateral 4 in step ups x 10 each   02/12/24 Nustep level 4 x 5 minutes Knee is a little more swollen today  Bike level 1 x 5 minutes partial revolutions to start then progressed to full Leg press no weight working on ROM both legs and then left only Feet on ball K2C, small bridge, isometric abs SAQ 3# 2x10 Passive stretch left LE  measured ROM as above  02/10/24 PROM to L knee flex/ext, patellar mobs, distraction LAQ 2# 2x10 HS curls green 2x10 Calf stretch 15s x3 NuStep L5 x8mins Bike L1 x71mins  Step ups 4" STS 2x10   02/06/24 Patellar mobs LLE L  knee PROM Nustep L3 LAQ 2x10 Red band HS curl 2x10 Sit to stand from elevated  2x10 Walking 161ft without cane x2  4in step ups 2x10   02/03/24 Evaluation  Vaso, left knee medium pressure 34 degrees   PATIENT EDUCATION:  Education details: HEP/POC Person educated: Patient Education method: Programmer, multimedia, Demonstration, Tactile cues, and Verbal cues Education comprehension: verbalized understanding  HOME EXERCISE PROGRAM: Gave her the seated knee flexion and then scoot forward for increased knee flexion  ASSESSMENT:  CLINICAL IMPRESSION: Pt arrived doing well ambulating without and AD. Continues to progress with L knee strength and ROM. She expressed interest in wanting to get back to the gym but that requires getting up and down from the floor. So we practice a transfer from the mat table. Step ups are still difficult especially pushing off with LLE. Worked on strengthening with more machine level interventions and isolated LLE for reps. No pain during session. She would benefit from skilled PT to further improve her LLE ROM and strength to be able to perform functional tasks.   OBJECTIVE IMPAIRMENTS: Abnormal gait, cardiopulmonary status limiting activity, decreased activity tolerance, decreased balance, decreased endurance, decreased mobility, difficulty walking, decreased ROM, decreased strength, increased edema, increased fascial restrictions, impaired perceived functional ability, increased muscle spasms, impaired flexibility, improper body mechanics, postural dysfunction, and pain.   REHAB POTENTIAL: Good  CLINICAL DECISION MAKING: Evolving/moderate complexity  EVALUATION COMPLEXITY: Moderate   GOALS: Goals reviewed with patient? Yes  SHORT TERM GOALS: Target date: 02/19/24 Independent with initial HEP Baseline: Goal status: MET 02/10/24  LONG TERM GOALS: Target date: 05/04/24  Independent with RICE Baseline:  Goal status: met 02/13/24  2.  Independent with  advanced HEP Baseline:  Goal status: INITIAL  3.  Decrease pain 50% Baseline:  Goal status: MET 03/04/24  4.  Increase AROM to 0-120 degrees flexion Baseline:  Goal status: ongoing 02/13/24, Progressing 02/20/24  5.  Walk without device Baseline:  Goal status: MET 03/04/24  6.  Return to the gym Baseline:  Goal status: Progressing 02/20/24   PLAN:  PT FREQUENCY: 2x/week  PT DURATION: 12 weeks  PLANNED INTERVENTIONS: 97164- PT Re-evaluation, 97110-Therapeutic exercises, 97530- Therapeutic activity, 97112- Neuromuscular re-education, 97535- Self Care, 16109- Manual therapy, [phone]- Gait training, [phone]- Electrical stimulation (unattended), 97016- Vasopneumatic device, Patient/Family education, Balance training, Stair training, Taping, Joint mobilization, Scar mobilization, and Cryotherapy  PLAN FOR NEXT SESSION: work on strength, ROM and function   [employer], PT 03/04/2024, 8:45 AM

## 2024-03-04 ENCOUNTER — Ambulatory Visit

## 2024-03-04 DIAGNOSIS — M25562 Pain in left knee: Secondary | ICD-10-CM | POA: Diagnosis not present

## 2024-03-04 DIAGNOSIS — R262 Difficulty in walking, not elsewhere classified: Secondary | ICD-10-CM | POA: Diagnosis not present

## 2024-03-04 DIAGNOSIS — M25662 Stiffness of left knee, not elsewhere classified: Secondary | ICD-10-CM

## 2024-03-04 DIAGNOSIS — R600 Localized edema: Secondary | ICD-10-CM

## 2024-03-05 NOTE — Therapy (Signed)
 OUTPATIENT PHYSICAL THERAPY LOWER EXTREMITY TREATMENT   Patient Name: Sara Nicholson MRN: [id number] DOB:1956/03/04, 68 y.o., female Today's Date: 03/06/2024  END OF SESSION:  PT End of Session - 03/06/24 0846     Visit Number 9    Date for PT Re-Evaluation 05/04/24    PT Start Time 0845    PT Stop Time 0930    PT Time Calculation (min) 45 min    Activity Tolerance Patient tolerated treatment well    Behavior During Therapy [facility] for tasks assessed/performed                Past Medical History:  Diagnosis Date   Arthritis    Cancer (HCC)    squamous lesion right shoulder   Depression    Hypertension    Neuromuscular disorder (HCC)    Parkinson disease (HCC)    Pneumonia 1988   Past Surgical History:  Procedure Laterality Date   APPENDECTOMY     BREAST EXCISIONAL BIOPSY Left    benign cyst   CESAREAN SECTION     x1   COLONOSCOPY     TOTAL KNEE ARTHROPLASTY Left 01/17/2024   Procedure: ARTHROPLASTY, KNEE, TOTAL;  Surgeon: Arnie Lao, MD;  Location: [facility];  Service: Orthopedics;  Laterality: Left;   Patient Active Problem List   Diagnosis Date Noted   Status post total left knee replacement 01/17/2024   Unilateral primary osteoarthritis, left knee 10/30/2023   Hyperlipidemia 11/08/2022   Class 2 severe obesity due to excess calories with serious comorbidity and body mass index (BMI) of 37.0 to 37.9 in adult [facility]) 11/08/2022   Parkinson's disease (HCC) 01/16/2021    PCP: Pauletta Boroughs, MD  REFERRING PROVIDER: Lucienne Ryder, MD  REFERRING DIAG: S/P left TKA  THERAPY DIAG:  Acute pain of left knee  Stiffness of left knee, not elsewhere classified  Difficulty in walking, not elsewhere classified  Localized edema  Rationale for Evaluation and Treatment: Rehabilitation  ONSET DATE: 01/17/24  SUBJECTIVE:   SUBJECTIVE STATEMENT: L knee feels tight. R one is grinding and popping but does not really hurt.   PERTINENT HISTORY: PD PAIN:  Are you  having pain? Yes: NPRS scale: 0/10 Pain location: left knee  Pain description: ache sore Aggravating factors: walking, bending pain up 4/10 Relieving factors: mm relaxer and ice help can b0e 0/10  PRECAUTIONS: None  RED FLAGS: None   WEIGHT BEARING RESTRICTIONS: No  FALLS:  Has patient fallen in last 6 months? No  LIVING ENVIRONMENT: Lives with: lives with their family Lives in: House/apartment Stairs: Yes: External: 5 steps; can reach both Has following equipment at home: Single point cane, Walker - 2 wheeled, Marine scientist  OCCUPATION: retired  PLOF: Independent and goes to the gym at least 3x/week  PATIENT GOALS: get back to the gym  NEXT MD VISIT: Feb 27, 2024  OBJECTIVE:  Note: Objective measures were completed at Evaluation unless otherwise noted.  DIAGNOSTIC FINDINGS: n/a  PATIENT SURVEYS:  LEFS 40%  COGNITION: Overall cognitive status: Within functional limits for tasks assessed     SENSATION: WFL  EDEMA:  Circumferential: left mid patella 55cm, right 50 cm  MUSCLE LENGTH:  PALPATION: Swollen, mild warmth , tender to palpation medial and lateral knee  LOWER EXTREMITY ROM:  Active ROM AROM left eval PROM Left eval AROM Left  02/13/24 AROM Left 02/20/24 AROM Left 03/06/24  Hip flexion       Hip extension       Hip abduction  Hip adduction       Hip internal rotation       Hip external rotation       Knee flexion 90 100 99 108 110  Knee extension 10 5 7 4 4   Ankle dorsiflexion       Ankle plantarflexion       Ankle inversion       Ankle eversion        (Blank rows = not tested)  LOWER EXTREMITY MMT:  MMT Right eval Left eval  Hip flexion    Hip extension    Hip abduction    Hip adduction    Hip internal rotation    Hip external rotation    Knee flexion  4  Knee extension  4  Ankle dorsiflexion    Ankle plantarflexion    Ankle inversion    Ankle eversion     (Blank rows = not tested)  LOWER EXTREMITY SPECIAL TESTS:   Knee special tests: Patella tap test (ballotable patella): positive  and mild quad lag  FUNCTIONAL TESTS:  5 times sit to stand: 29 seconds with need to use hands Timed up and go (TUG): 21 seconds with SPC  GAIT: Distance walked: 60 feet Assistive device utilized: Single point cane Level of assistance: Modified independence Comments: slow, antalgic on the left                                                                                                                                TREATMENT DATE: 03/06/24 Bike L3 xmins  Resisted gait 30# 4 way x4  2 way hip 5# 2x10 Calf stretch 15s x2   4" step ups  4" lateral step ups  PROM AROM-- 110d flexion, 4d extension  HS stretch  TKE green 2x10  03/04/24 Bike L2 x94mins  Calf stretch 30s x2 Step ups 6"  Floor transfer  HS curls 35lb 2x10, LLE 20# x10  Leg Ext 15lb 2x10, LLE 5# x10 Leg press 40# 2x10, LLE 20# x10 Chair squats x10   02/24/24 NuStep L 5 x 6 min 30lb Resisted gait 4 way x4 each 6 in step ups Lle x10, x5 Leg press 40lb 2x10, LLE 20lb 2x5 L knee PROM with end range holds   L tibiofemoral and patellar mobs LAQ  LLE 3lb 2x10  02/20/24 Bike L 2 x 6 min L knee PROM with end range holds   L tibiofemoral and patellar mobs Sit to stand holding yellow ball 2x10 HS curls 25lb 2x10 Leg Ext 10lb 2x10 Leg press 40lb 2x10 6in step ups 2x10each LAQ  LLE 3lb 2x10 HS curls green 2x10  02/17/24 Nustep level 5 x 6 minutes L knee PROM with end range holds   L tibiofemoral and patellar mobs S2S 2x10 LAQ LLE 3lb 2x10 LLE HS curls green 2x10 4in step ups x10 each Heel raises black bar 2x10 Leg press 20lb 2x10  Lateral 4 in step ups  x 10 each   02/12/24 Nustep level 4 x 5 minutes Knee is a little more swollen today  Bike level 1 x 5 minutes partial revolutions to start then progressed to full Leg press no weight working on ROM both legs and then left only Feet on ball K2C, small bridge, isometric abs SAQ 3#  2x10 Passive stretch left LE measured ROM as above  02/10/24 PROM to L knee flex/ext, patellar mobs, distraction LAQ 2# 2x10 HS curls green 2x10 Calf stretch 15s x3 NuStep L5 x39mins Bike L1 x63mins  Step ups 4" STS 2x10   02/06/24 Patellar mobs LLE L knee PROM Nustep L3 LAQ 2x10 Red band HS curl 2x10 Sit to stand from elevated  2x10 Walking 136ft without cane x2  4in step ups 2x10   02/03/24 Evaluation  Vaso, left knee medium pressure 34 degrees   PATIENT EDUCATION:  Education details: HEP/POC Person educated: Patient Education method: Programmer, multimedia, Demonstration, Tactile cues, and Verbal cues Education comprehension: verbalized understanding  HOME EXERCISE PROGRAM: Gave her the seated knee flexion and then scoot forward for increased knee flexion  ASSESSMENT:  CLINICAL IMPRESSION: Pt arrived doing well but walking with a limp. She reports the L knee feels tight. Continues to progress with L knee strength and ROM. Her L knee has an increased valgus stress. Worked on some hip abduction strengthening. Step ups to 4" were a lot smoother compared to 6". No pain during session. Continue to progress as tolerated, really push to get further AROM. She would benefit from skilled PT to further improve her LLE ROM and strength to be able to perform functional tasks.   OBJECTIVE IMPAIRMENTS: Abnormal gait, cardiopulmonary status limiting activity, decreased activity tolerance, decreased balance, decreased endurance, decreased mobility, difficulty walking, decreased ROM, decreased strength, increased edema, increased fascial restrictions, impaired perceived functional ability, increased muscle spasms, impaired flexibility, improper body mechanics, postural dysfunction, and pain.   REHAB POTENTIAL: Good  CLINICAL DECISION MAKING: Evolving/moderate complexity  EVALUATION COMPLEXITY: Moderate   GOALS: Goals reviewed with patient? Yes  SHORT TERM GOALS: Target date:  02/19/24 Independent with initial HEP Baseline: Goal status: MET 02/10/24  LONG TERM GOALS: Target date: 05/04/24  Independent with RICE Baseline:  Goal status: met 02/13/24  2.  Independent with advanced HEP Baseline:  Goal status: INITIAL  3.  Decrease pain 50% Baseline:  Goal status: MET 03/04/24  4.  Increase AROM to 0-120 degrees flexion Baseline:  Goal status: ongoing 02/13/24, Progressing 02/20/24, ongoing 03/06/24  5.  Walk without device Baseline:  Goal status: MET 03/04/24  6.  Return to the gym Baseline:  Goal status: Progressing 02/20/24   PLAN:  PT FREQUENCY: 2x/week  PT DURATION: 12 weeks  PLANNED INTERVENTIONS: 97164- PT Re-evaluation, 97110-Therapeutic exercises, 97530- Therapeutic activity, 97112- Neuromuscular re-education, 97535- Self Care, 42595- Manual therapy, [phone]- Gait training, [phone]- Electrical stimulation (unattended), 97016- Vasopneumatic device, Patient/Family education, Balance training, Stair training, Taping, Joint mobilization, Scar mobilization, and Cryotherapy  PLAN FOR NEXT SESSION: work on strength, ROM and function   [employer], PT 03/06/2024, 9:30 AM

## 2024-03-06 ENCOUNTER — Ambulatory Visit

## 2024-03-06 DIAGNOSIS — R600 Localized edema: Secondary | ICD-10-CM | POA: Diagnosis not present

## 2024-03-06 DIAGNOSIS — M25562 Pain in left knee: Secondary | ICD-10-CM | POA: Diagnosis not present

## 2024-03-06 DIAGNOSIS — M25662 Stiffness of left knee, not elsewhere classified: Secondary | ICD-10-CM

## 2024-03-06 DIAGNOSIS — R262 Difficulty in walking, not elsewhere classified: Secondary | ICD-10-CM

## 2024-03-07 DIAGNOSIS — M1712 Unilateral primary osteoarthritis, left knee: Secondary | ICD-10-CM | POA: Diagnosis not present

## 2024-03-07 DIAGNOSIS — G20A1 Parkinson's disease without dyskinesia, without mention of fluctuations: Secondary | ICD-10-CM | POA: Diagnosis not present

## 2024-03-09 ENCOUNTER — Encounter: Payer: Self-pay | Admitting: Sports Medicine

## 2024-03-10 NOTE — Telephone Encounter (Signed)
 Ran benefits for right knee in flexforward portal case ID [id number]

## 2024-03-11 NOTE — Therapy (Signed)
 OUTPATIENT PHYSICAL THERAPY LOWER EXTREMITY TREATMENT Progress Note Reporting Period 02/03/24 to 03/12/24  See note below for Objective Data and Assessment of Progress/Goals.      Patient Name: Sara Nicholson MRN: [id number] DOB:Feb 28, 1956, 68 y.o., female Today's Date: 03/12/2024  END OF SESSION:  PT End of Session - 03/12/24 1009     Visit Number 10    Date for PT Re-Evaluation 05/04/24    PT Start Time 1010    PT Stop Time 1055    PT Time Calculation (min) 45 min    Activity Tolerance Patient tolerated treatment well    Behavior During Therapy WFL for tasks assessed/performed                 Past Medical History:  Diagnosis Date   Arthritis    Cancer (HCC)    squamous lesion right shoulder   Depression    Hypertension    Neuromuscular disorder (HCC)    Parkinson disease (HCC)    Pneumonia 1988   Past Surgical History:  Procedure Laterality Date   APPENDECTOMY     BREAST EXCISIONAL BIOPSY Left    benign cyst   CESAREAN SECTION     x1   COLONOSCOPY     TOTAL KNEE ARTHROPLASTY Left 01/17/2024   Procedure: ARTHROPLASTY, KNEE, TOTAL;  Surgeon: Arnie Lao, MD;  Location: [facility];  Service: Orthopedics;  Laterality: Left;   Patient Active Problem List   Diagnosis Date Noted   Status post total left knee replacement 01/17/2024   Unilateral primary osteoarthritis, left knee 10/30/2023   Hyperlipidemia 11/08/2022   Class 2 severe obesity due to excess calories with serious comorbidity and body mass index (BMI) of 37.0 to 37.9 in adult [facility]) 11/08/2022   Parkinson's disease (HCC) 01/16/2021    PCP: Pauletta Boroughs, MD  REFERRING PROVIDER: Lucienne Ryder, MD  REFERRING DIAG: S/P left TKA  THERAPY DIAG:  Acute pain of left knee  Stiffness of left knee, not elsewhere classified  Difficulty in walking, not elsewhere classified  Localized edema  Rationale for Evaluation and Treatment: Rehabilitation  ONSET DATE: 01/17/24  SUBJECTIVE:   SUBJECTIVE  STATEMENT: The L knee is fine, the R one is killing me. It is popping and sliding and painful.   PERTINENT HISTORY: PD PAIN:  Are you having pain? Yes: NPRS scale: 0/10 Pain location: left knee  Pain description: ache sore Aggravating factors: walking, bending pain up 4/10 Relieving factors: mm relaxer and ice help can b0e 0/10  PRECAUTIONS: None  RED FLAGS: None   WEIGHT BEARING RESTRICTIONS: No  FALLS:  Has patient fallen in last 6 months? No  LIVING ENVIRONMENT: Lives with: lives with their family Lives in: House/apartment Stairs: Yes: External: 5 steps; can reach both Has following equipment at home: Single point cane, Walker - 2 wheeled, Marine scientist  OCCUPATION: retired  PLOF: Independent and goes to the gym at least 3x/week  PATIENT GOALS: get back to the gym  NEXT MD VISIT: 03/25/24  OBJECTIVE:  Note: Objective measures were completed at Evaluation unless otherwise noted.  DIAGNOSTIC FINDINGS: n/a  PATIENT SURVEYS:  LEFS 40%  COGNITION: Overall cognitive status: Within functional limits for tasks assessed     SENSATION: WFL  EDEMA:  Circumferential: left mid patella 55cm, right 50 cm  MUSCLE LENGTH:  PALPATION: Swollen, mild warmth , tender to palpation medial and lateral knee  LOWER EXTREMITY ROM:  Active ROM AROM left eval PROM Left eval AROM Left  02/13/24 AROM Left 02/20/24  AROM Left 03/06/24  Hip flexion       Hip extension       Hip abduction       Hip adduction       Hip internal rotation       Hip external rotation       Knee flexion 90 100 99 108 110  Knee extension 10 5 7 4 4   Ankle dorsiflexion       Ankle plantarflexion       Ankle inversion       Ankle eversion        (Blank rows = not tested)  LOWER EXTREMITY MMT:  MMT Right eval Left eval  Hip flexion    Hip extension    Hip abduction    Hip adduction    Hip internal rotation    Hip external rotation    Knee flexion  4  Knee extension  4  Ankle  dorsiflexion    Ankle plantarflexion    Ankle inversion    Ankle eversion     (Blank rows = not tested)  LOWER EXTREMITY SPECIAL TESTS:  Knee special tests: Patella tap test (ballotable patella): positive  and mild quad lag  FUNCTIONAL TESTS:  5 times sit to stand: 29 seconds with need to use hands Timed up and go (TUG): 21 seconds with SPC  GAIT: Distance walked: 60 feet Assistive device utilized: Single point cane Level of assistance: Modified independence Comments: slow, antalgic on the left                                                                                                                                TREATMENT DATE: 03/12/24 Bike L3 x11mins Leg Ext 15lb 2x10, LLE 5# x10 HS curls 35lb 2x10, LLE 20# x10  Step ups 6" AROM- 112d flex, 2d ext Kneeling on half moon bolster in bars and lunges x10 Chairs squats 2x10    03/06/24 Bike L3 x52mins  Resisted gait 30# 4 way x4  2 way hip 5# 2x10 Calf stretch 15s x2   4" step ups  4" lateral step ups  PROM AROM-- 110d flexion, 4d extension  HS stretch  TKE green 2x10  03/04/24 Bike L2 x44mins  Calf stretch 30s x2 Step ups 6"  Floor transfer  HS curls 35lb 2x10, LLE 20# x10  Leg Ext 15lb 2x10, LLE 5# x10 Leg press 40# 2x10, LLE 20# x10 Chair squats x10   02/24/24 NuStep L 5 x 6 min 30lb Resisted gait 4 way x4 each 6 in step ups Lle x10, x5 Leg press 40lb 2x10, LLE 20lb 2x5 L knee PROM with end range holds   L tibiofemoral and patellar mobs LAQ  LLE 3lb 2x10  02/20/24 Bike L 2 x 6 min L knee PROM with end range holds   L tibiofemoral and patellar mobs Sit to stand holding yellow ball 2x10 HS curls 25lb 2x10 Leg Ext 10lb  2x10 Leg press 40lb 2x10 6in step ups 2x10each LAQ  LLE 3lb 2x10 HS curls green 2x10  02/17/24 Nustep level 5 x 6 minutes L knee PROM with end range holds   L tibiofemoral and patellar mobs S2S 2x10 LAQ LLE 3lb 2x10 LLE HS curls green 2x10 4in step ups x10 each Heel raises  black bar 2x10 Leg press 20lb 2x10  Lateral 4 in step ups x 10 each   02/12/24 Nustep level 4 x 5 minutes Knee is a little more swollen today  Bike level 1 x 5 minutes partial revolutions to start then progressed to full Leg press no weight working on ROM both legs and then left only Feet on ball K2C, small bridge, isometric abs SAQ 3# 2x10 Passive stretch left LE measured ROM as above  02/10/24 PROM to L knee flex/ext, patellar mobs, distraction LAQ 2# 2x10 HS curls green 2x10 Calf stretch 15s x3 NuStep L5 x72mins Bike L1 x50mins  Step ups 4" STS 2x10   02/06/24 Patellar mobs LLE L knee PROM Nustep L3 LAQ 2x10 Red band HS curl 2x10 Sit to stand from elevated  2x10 Walking 150ft without cane x2  4in step ups 2x10   02/03/24 Evaluation  Vaso, left knee medium pressure 34 degrees   PATIENT EDUCATION:  Education details: HEP/POC Person educated: Patient Education method: Programmer, multimedia, Demonstration, Tactile cues, and Verbal cues Education comprehension: verbalized understanding  HOME EXERCISE PROGRAM: Gave her the seated knee flexion and then scoot forward for increased knee flexion  ASSESSMENT:  CLINICAL IMPRESSION: Pt arrived doing well with L knee. However, the R one is giving her trouble now. Continued to progress with L knee strength and ROM. She still has difficulty with steps, especially going up. And also reports getting up from low surfaces such as chairs and toilets are hard. No pain during session. Continue to progress as tolerated, really push to get further AROM. She would benefit from skilled PT to further improve her LLE ROM and strength to be able to perform functional tasks.   OBJECTIVE IMPAIRMENTS: Abnormal gait, cardiopulmonary status limiting activity, decreased activity tolerance, decreased balance, decreased endurance, decreased mobility, difficulty walking, decreased ROM, decreased strength, increased edema, increased fascial restrictions, impaired  perceived functional ability, increased muscle spasms, impaired flexibility, improper body mechanics, postural dysfunction, and pain.   REHAB POTENTIAL: Good  CLINICAL DECISION MAKING: Evolving/moderate complexity  EVALUATION COMPLEXITY: Moderate   GOALS: Goals reviewed with patient? Yes  SHORT TERM GOALS: Target date: 02/19/24 Independent with initial HEP Baseline: Goal status: MET 02/10/24  LONG TERM GOALS: Target date: 05/04/24  Independent with RICE Baseline:  Goal status: met 02/13/24  2.  Independent with advanced HEP Baseline:  Goal status: ongoing 03/12/24  3.  Decrease pain 50% Baseline:  Goal status: MET 03/04/24  4.  Increase AROM to 0-120 degrees flexion Baseline:  Goal status: ongoing 02/13/24, Progressing 02/20/24, ongoing 03/06/24, ongoing 03/12/24  5.  Walk without device Baseline:  Goal status: MET 03/04/24  6.  Return to the gym Baseline:  Goal status: Progressing 02/20/24, 03/12/24   PLAN:  PT FREQUENCY: 2x/week  PT DURATION: 12 weeks  PLANNED INTERVENTIONS: 97164- PT Re-evaluation, 97110-Therapeutic exercises, 97530- Therapeutic activity, 97112- Neuromuscular re-education, 97535- Self Care, 16109- Manual therapy, [phone]- Gait training, [phone]- Electrical stimulation (unattended), 97016- Vasopneumatic device, Patient/Family education, Balance training, Stair training, Taping, Joint mobilization, Scar mobilization, and Cryotherapy  PLAN FOR NEXT SESSION: work on strength, ROM and function   [employer], PT 03/12/2024, 10:55  AM

## 2024-03-12 ENCOUNTER — Ambulatory Visit: Attending: Orthopaedic Surgery

## 2024-03-12 ENCOUNTER — Telehealth: Payer: Self-pay

## 2024-03-12 ENCOUNTER — Other Ambulatory Visit: Payer: Self-pay | Admitting: Sports Medicine

## 2024-03-12 DIAGNOSIS — M25562 Pain in left knee: Secondary | ICD-10-CM | POA: Diagnosis not present

## 2024-03-12 DIAGNOSIS — R600 Localized edema: Secondary | ICD-10-CM | POA: Insufficient documentation

## 2024-03-12 DIAGNOSIS — M25662 Stiffness of left knee, not elsewhere classified: Secondary | ICD-10-CM | POA: Diagnosis not present

## 2024-03-12 DIAGNOSIS — R262 Difficulty in walking, not elsewhere classified: Secondary | ICD-10-CM | POA: Diagnosis not present

## 2024-03-12 MED ORDER — MELOXICAM 15 MG PO TABS: 15.0000 mg | ORAL_TABLET | Freq: Every day | ORAL | 0 refills | Status: DC

## 2024-03-12 NOTE — Telephone Encounter (Signed)
 Can you schedule patient when medication is in stock   [place]  authorized for right knee NO PA, MEDICAL NOTES, or REFERRALS needed Patient responsible for 20%  80% is covered by the payer at the contracted rate Copay $20 OOP MAX $3900 has met $786.52 Deductible does not apply Once the OOP has been met coverage goes to 100% and copay will no longer apply  Reference number [id number]

## 2024-03-12 NOTE — Telephone Encounter (Signed)
Meloxicam has been sent in

## 2024-03-13 ENCOUNTER — Other Ambulatory Visit: Payer: Self-pay | Admitting: Neurology

## 2024-03-13 ENCOUNTER — Ambulatory Visit: Admitting: Physical Therapy

## 2024-03-13 ENCOUNTER — Encounter: Payer: Self-pay | Admitting: Physical Therapy

## 2024-03-13 DIAGNOSIS — R600 Localized edema: Secondary | ICD-10-CM | POA: Diagnosis not present

## 2024-03-13 DIAGNOSIS — M25562 Pain in left knee: Secondary | ICD-10-CM

## 2024-03-13 DIAGNOSIS — M25662 Stiffness of left knee, not elsewhere classified: Secondary | ICD-10-CM

## 2024-03-13 DIAGNOSIS — R262 Difficulty in walking, not elsewhere classified: Secondary | ICD-10-CM | POA: Diagnosis not present

## 2024-03-13 DIAGNOSIS — G20A1 Parkinson's disease without dyskinesia, without mention of fluctuations: Secondary | ICD-10-CM

## 2024-03-13 NOTE — Therapy (Signed)
 OUTPATIENT PHYSICAL THERAPY LOWER EXTREMITY TREATMENT P    Patient Name: Sara Nicholson MRN: [id number] DOB:1955-12-20, 68 y.o., female Today's Date: 03/13/2024  END OF SESSION:  PT End of Session - 03/13/24 0849     Visit Number 11    Date for PT Re-Evaluation 05/04/24    PT Start Time 0848    PT Stop Time 0930    PT Time Calculation (min) 42 min    Activity Tolerance Patient tolerated treatment well    Behavior During Therapy WFL for tasks assessed/performed                 Past Medical History:  Diagnosis Date   Arthritis    Cancer (HCC)    squamous lesion right shoulder   Depression    Hypertension    Neuromuscular disorder (HCC)    Parkinson disease (HCC)    Pneumonia 1988   Past Surgical History:  Procedure Laterality Date   APPENDECTOMY     BREAST EXCISIONAL BIOPSY Left    benign cyst   CESAREAN SECTION     x1   COLONOSCOPY     TOTAL KNEE ARTHROPLASTY Left 01/17/2024   Procedure: ARTHROPLASTY, KNEE, TOTAL;  Surgeon: Arnie Lao, MD;  Location: [facility];  Service: Orthopedics;  Laterality: Left;   Patient Active Problem List   Diagnosis Date Noted   Status post total left knee replacement 01/17/2024   Unilateral primary osteoarthritis, left knee 10/30/2023   Hyperlipidemia 11/08/2022   Class 2 severe obesity due to excess calories with serious comorbidity and body mass index (BMI) of 37.0 to 37.9 in adult [facility]) 11/08/2022   Parkinson's disease (HCC) 01/16/2021    PCP: Pauletta Boroughs, MD  REFERRING PROVIDER: Lucienne Ryder, MD  REFERRING DIAG: S/P left TKA  THERAPY DIAG:  Acute pain of left knee  Stiffness of left knee, not elsewhere classified  Difficulty in walking, not elsewhere classified  Localized edema  Rationale for Evaluation and Treatment: Rehabilitation  ONSET DATE: 01/17/24  SUBJECTIVE:   SUBJECTIVE STATEMENT: Sore, My r knee hurts, its going bad  PERTINENT HISTORY: PD PAIN:  Are you having pain? Yes: NPRS scale:  2/10 Pain location: Right knee  Pain description: ache sore Aggravating factors: walking, bending pain up 4/10 Relieving factors: mm relaxer and ice help can b0e 0/10  PRECAUTIONS: None  RED FLAGS: None   WEIGHT BEARING RESTRICTIONS: No  FALLS:  Has patient fallen in last 6 months? No  LIVING ENVIRONMENT: Lives with: lives with their family Lives in: House/apartment Stairs: Yes: External: 5 steps; can reach both Has following equipment at home: Single point cane, Walker - 2 wheeled, Marine scientist  OCCUPATION: retired  PLOF: Independent and goes to the gym at least 3x/week  PATIENT GOALS: get back to the gym  NEXT MD VISIT: 03/25/24  OBJECTIVE:  Note: Objective measures were completed at Evaluation unless otherwise noted.  DIAGNOSTIC FINDINGS: n/a  PATIENT SURVEYS:  LEFS 40%  COGNITION: Overall cognitive status: Within functional limits for tasks assessed     SENSATION: WFL  EDEMA:  Circumferential: left mid patella 55cm, right 50 cm  MUSCLE LENGTH:  PALPATION: Swollen, mild warmth , tender to palpation medial and lateral knee  LOWER EXTREMITY ROM:  Active ROM AROM left eval PROM Left eval AROM Left  02/13/24 AROM Left 02/20/24 AROM Left 03/06/24 AROM Left 03/13/24  Hip flexion        Hip extension        Hip abduction  Hip adduction        Hip internal rotation        Hip external rotation        Knee flexion 90 100 99 108 110 112  Knee extension 10 5 7 4 4 2   Ankle dorsiflexion        Ankle plantarflexion        Ankle inversion        Ankle eversion         (Blank rows = not tested)  LOWER EXTREMITY MMT:  MMT Right eval Left eval  Hip flexion    Hip extension    Hip abduction    Hip adduction    Hip internal rotation    Hip external rotation    Knee flexion  4  Knee extension  4  Ankle dorsiflexion    Ankle plantarflexion    Ankle inversion    Ankle eversion     (Blank rows = not tested)  LOWER EXTREMITY SPECIAL TESTS:   Knee special tests: Patella tap test (ballotable patella): positive  and mild quad lag  FUNCTIONAL TESTS:  5 times sit to stand: 29 seconds with need to use hands Timed up and go (TUG): 21 seconds with SPC  GAIT: Distance walked: 60 feet Assistive device utilized: Single point cane Level of assistance: Modified independence Comments: slow, antalgic on the left                                                                                                                                TREATMENT DATE: 03/13/24 NuStep L 5 x 5 min LE only  L knee PROM with end range holds   L tibiofemoral and patellar mobs S2S 2x10 6in step ups  Leg Ext 15lb 2x10, LLE 5# x10 HS curls 35lb 2x10, LLE 20# x10  6in lateral step usp   03/12/24 Bike L3 x66mins Leg Ext 15lb 2x10, LLE 5# x10 HS curls 35lb 2x10, LLE 20# x10  Step ups 6" AROM- 112d flex, 2d ext Kneeling on half moon bolster in bars and lunges x10 Chairs squats 2x10    03/06/24 Bike L3 x58mins  Resisted gait 30# 4 way x4  2 way hip 5# 2x10 Calf stretch 15s x2   4" step ups  4" lateral step ups  PROM AROM-- 110d flexion, 4d extension  HS stretch  TKE green 2x10  03/04/24 Bike L2 x82mins  Calf stretch 30s x2 Step ups 6"  Floor transfer  HS curls 35lb 2x10, LLE 20# x10  Leg Ext 15lb 2x10, LLE 5# x10 Leg press 40# 2x10, LLE 20# x10 Chair squats x10   02/24/24 NuStep L 5 x 6 min 30lb Resisted gait 4 way x4 each 6 in step ups Lle x10, x5 Leg press 40lb 2x10, LLE 20lb 2x5 L knee PROM with end range holds   L tibiofemoral and patellar mobs LAQ  LLE 3lb 2x10  02/20/24 Bike L  2 x 6 min L knee PROM with end range holds   L tibiofemoral and patellar mobs Sit to stand holding yellow ball 2x10 HS curls 25lb 2x10 Leg Ext 10lb 2x10 Leg press 40lb 2x10 6in step ups 2x10each LAQ  LLE 3lb 2x10 HS curls green 2x10  02/17/24 Nustep level 5 x 6 minutes L knee PROM with end range holds   L tibiofemoral and patellar mobs S2S  2x10 LAQ LLE 3lb 2x10 LLE HS curls green 2x10 4in step ups x10 each Heel raises black bar 2x10 Leg press 20lb 2x10  Lateral 4 in step ups x 10 each   02/12/24 Nustep level 4 x 5 minutes Knee is a little more swollen today  Bike level 1 x 5 minutes partial revolutions to start then progressed to full Leg press no weight working on ROM both legs and then left only Feet on ball K2C, small bridge, isometric abs SAQ 3# 2x10 Passive stretch left LE measured ROM as above  02/10/24 PROM to L knee flex/ext, patellar mobs, distraction LAQ 2# 2x10 HS curls green 2x10 Calf stretch 15s x3 NuStep L5 x65mins Bike L1 x26mins  Step ups 4" STS 2x10   02/06/24 Patellar mobs LLE L knee PROM Nustep L3 LAQ 2x10 Red band HS curl 2x10 Sit to stand from elevated  2x10 Walking 174ft without cane x2  4in step ups 2x10   02/03/24 Evaluation  Vaso, left knee medium pressure 34 degrees   PATIENT EDUCATION:  Education details: HEP/POC Person educated: Patient Education method: Programmer, multimedia, Demonstration, Tactile cues, and Verbal cues Education comprehension: verbalized understanding  HOME EXERCISE PROGRAM: Gave her the seated knee flexion and then scoot forward for increased knee flexion  ASSESSMENT:  CLINICAL IMPRESSION: Pt arrived doing well with L knee, however, the R one is giving her trouble now in regards to pain. Continued to progress with L knee strength and ROM. She still has difficulty with steps displaying LLE weakness. She has progressed increasing her L knee AROM.  Aaron Aas No pain during session. Continue to progress as tolerated, really push to get further AROM. She would benefit from skilled PT to further improve her LLE ROM and strength to be able to perform functional tasks.   OBJECTIVE IMPAIRMENTS: Abnormal gait, cardiopulmonary status limiting activity, decreased activity tolerance, decreased balance, decreased endurance, decreased mobility, difficulty walking, decreased ROM,  decreased strength, increased edema, increased fascial restrictions, impaired perceived functional ability, increased muscle spasms, impaired flexibility, improper body mechanics, postural dysfunction, and pain.   REHAB POTENTIAL: Good  CLINICAL DECISION MAKING: Evolving/moderate complexity  EVALUATION COMPLEXITY: Moderate   GOALS: Goals reviewed with patient? Yes  SHORT TERM GOALS: Target date: 02/19/24 Independent with initial HEP Baseline: Goal status: MET 02/10/24  LONG TERM GOALS: Target date: 05/04/24  Independent with RICE Baseline:  Goal status: met 02/13/24  2.  Independent with advanced HEP Baseline:  Goal status: ongoing 03/12/24  3.  Decrease pain 50% Baseline:  Goal status: MET 03/04/24  4.  Increase AROM to 0-120 degrees flexion Baseline:  Goal status: ongoing 02/13/24, Progressing 02/20/24, ongoing 03/06/24, ongoing 03/12/24  5.  Walk without device Baseline:  Goal status: MET 03/04/24  6.  Return to the gym Baseline:  Goal status: Progressing 02/20/24, 03/12/24   PLAN:  PT FREQUENCY: 2x/week  PT DURATION: 12 weeks  PLANNED INTERVENTIONS: 97164- PT Re-evaluation, 97110-Therapeutic exercises, 97530- Therapeutic activity, 97112- Neuromuscular re-education, 97535- Self Care, 21308- Manual therapy, [id number]- Gait training, [phone]- Electrical stimulation (unattended), 97016- Vasopneumatic device, Patient/Family  education, Balance training, Stair training, Taping, Joint mobilization, Scar mobilization, and Cryotherapy  PLAN FOR NEXT SESSION: work on strength, ROM and function   Ollen Beverage, PTA 03/13/2024, 8:49 AM

## 2024-03-16 ENCOUNTER — Ambulatory Visit: Admitting: Physical Therapy

## 2024-03-16 ENCOUNTER — Encounter: Payer: Self-pay | Admitting: Physical Therapy

## 2024-03-16 DIAGNOSIS — M25662 Stiffness of left knee, not elsewhere classified: Secondary | ICD-10-CM

## 2024-03-16 DIAGNOSIS — M25562 Pain in left knee: Secondary | ICD-10-CM | POA: Diagnosis not present

## 2024-03-16 DIAGNOSIS — R262 Difficulty in walking, not elsewhere classified: Secondary | ICD-10-CM | POA: Diagnosis not present

## 2024-03-16 DIAGNOSIS — R600 Localized edema: Secondary | ICD-10-CM

## 2024-03-16 NOTE — Therapy (Signed)
 OUTPATIENT PHYSICAL THERAPY LOWER EXTREMITY TREATMENT P    Patient Name: Sara Nicholson MRN: [id number] DOB:1956/09/23, 68 y.o., female Today's Date: 03/16/2024  END OF SESSION:  PT End of Session - 03/16/24 1440     Visit Number 12    Date for PT Re-Evaluation 05/04/24    PT Start Time 1437    PT Stop Time 1515    PT Time Calculation (min) 38 min    Activity Tolerance Patient tolerated treatment well    Behavior During Therapy WFL for tasks assessed/performed                 Past Medical History:  Diagnosis Date   Arthritis    Cancer (HCC)    squamous lesion right shoulder   Depression    Hypertension    Neuromuscular disorder (HCC)    Parkinson disease (HCC)    Pneumonia 1988   Past Surgical History:  Procedure Laterality Date   APPENDECTOMY     BREAST EXCISIONAL BIOPSY Left    benign cyst   CESAREAN SECTION     x1   COLONOSCOPY     TOTAL KNEE ARTHROPLASTY Left 01/17/2024   Procedure: ARTHROPLASTY, KNEE, TOTAL;  Surgeon: Arnie Lao, MD;  Location: [facility];  Service: Orthopedics;  Laterality: Left;   Patient Active Problem List   Diagnosis Date Noted   Status post total left knee replacement 01/17/2024   Unilateral primary osteoarthritis, left knee 10/30/2023   Hyperlipidemia 11/08/2022   Class 2 severe obesity due to excess calories with serious comorbidity and body mass index (BMI) of 37.0 to 37.9 in adult [facility]) 11/08/2022   Parkinson's disease (HCC) 01/16/2021    PCP: Pauletta Boroughs, MD  REFERRING PROVIDER: Lucienne Ryder, MD  REFERRING DIAG: S/P left TKA  THERAPY DIAG:  Acute pain of left knee  Stiffness of left knee, not elsewhere classified  Difficulty in walking, not elsewhere classified  Localized edema  Rationale for Evaluation and Treatment: Rehabilitation  ONSET DATE: 01/17/24  SUBJECTIVE:   SUBJECTIVE STATEMENT: "Today has been rough" Non operative knee pain  PERTINENT HISTORY: PD PAIN:  Are you having pain? Yes: NPRS  scale: 5-6/10 Pain location: Right knee  Pain description: ache sore Aggravating factors: walking, bending pain up 4/10 Relieving factors: mm relaxer and ice help can b0e 0/10  PRECAUTIONS: None  RED FLAGS: None   WEIGHT BEARING RESTRICTIONS: No  FALLS:  Has patient fallen in last 6 months? No  LIVING ENVIRONMENT: Lives with: lives with their family Lives in: House/apartment Stairs: Yes: External: 5 steps; can reach both Has following equipment at home: Single point cane, Walker - 2 wheeled, Marine scientist  OCCUPATION: retired  PLOF: Independent and goes to the gym at least 3x/week  PATIENT GOALS: get back to the gym  NEXT MD VISIT: 03/25/24  OBJECTIVE:  Note: Objective measures were completed at Evaluation unless otherwise noted.  DIAGNOSTIC FINDINGS: n/a  PATIENT SURVEYS:  LEFS 40%  COGNITION: Overall cognitive status: Within functional limits for tasks assessed     SENSATION: WFL  EDEMA:  Circumferential: left mid patella 55cm, right 50 cm  MUSCLE LENGTH:  PALPATION: Swollen, mild warmth , tender to palpation medial and lateral knee  LOWER EXTREMITY ROM:  Active ROM AROM left eval PROM Left eval AROM Left  02/13/24 AROM Left 02/20/24 AROM Left 03/06/24 AROM Left 03/13/24  Hip flexion        Hip extension        Hip abduction  Hip adduction        Hip internal rotation        Hip external rotation        Knee flexion 90 100 99 108 110 112  Knee extension 10 5 7 4 4 2   Ankle dorsiflexion        Ankle plantarflexion        Ankle inversion        Ankle eversion         (Blank rows = not tested)  LOWER EXTREMITY MMT:  MMT Right eval Left eval  Hip flexion    Hip extension    Hip abduction    Hip adduction    Hip internal rotation    Hip external rotation    Knee flexion  4  Knee extension  4  Ankle dorsiflexion    Ankle plantarflexion    Ankle inversion    Ankle eversion     (Blank rows = not tested)  LOWER EXTREMITY  SPECIAL TESTS:  Knee special tests: Patella tap test (ballotable patella): positive  and mild quad lag  FUNCTIONAL TESTS:  5 times sit to stand: 29 seconds with need to use hands Timed up and go (TUG): 21 seconds with SPC  GAIT: Distance walked: 60 feet Assistive device utilized: Single point cane Level of assistance: Modified independence Comments: slow, antalgic on the left                                                                                                                                TREATMENT DATE: 03/16/24 NuStep L 5 x 6 min Resisted gait 30# 4 way x3 6in step ups  Leg Ext 15lb 2x10, LLE 5# x10 HS curls 35lb 2x10, LLE 20# x10  L knee PROM with end range holds   L tibiofemoral and patellar mobs Heel raises 2x10  03/13/24 NuStep L 5 x 5 min LE only  L knee PROM with end range holds   L tibiofemoral and patellar mobs S2S 2x10 6in step ups  Leg Ext 15lb 2x10, LLE 5# 2x10 HS curls 35lb 2x10, LLE 20# x10  6in lateral step usp   03/12/24 Bike L3 x36mins Leg Ext 15lb 2x10, LLE 5# x10 HS curls 35lb 2x10, LLE 20# x10  Step ups 6" AROM- 112d flex, 2d ext Kneeling on half moon bolster in bars and lunges x10 Chairs squats 2x10    03/06/24 Bike L3 x52mins  Resisted gait 30# 4 way x4  2 way hip 5# 2x10 Calf stretch 15s x2   4" step ups  4" lateral step ups  PROM AROM-- 110d flexion, 4d extension  HS stretch  TKE green 2x10  03/04/24 Bike L2 x60mins  Calf stretch 30s x2 Step ups 6"  Floor transfer  HS curls 35lb 2x10, LLE 20# x10  Leg Ext 15lb 2x10, LLE 5# x10 Leg press 40# 2x10, LLE 20# x10 Chair squats x10   02/24/24  NuStep L 5 x 6 min 30lb Resisted gait 4 way x4 each 6 in step ups Lle x10, x5 Leg press 40lb 2x10, LLE 20lb 2x5 L knee PROM with end range holds   L tibiofemoral and patellar mobs LAQ  LLE 3lb 2x10  02/20/24 Bike L 2 x 6 min L knee PROM with end range holds   L tibiofemoral and patellar mobs Sit to stand holding yellow ball  2x10 HS curls 25lb 2x10 Leg Ext 10lb 2x10 Leg press 40lb 2x10 6in step ups 2x10each LAQ  LLE 3lb 2x10 HS curls green 2x10  02/17/24 Nustep level 5 x 6 minutes L knee PROM with end range holds   L tibiofemoral and patellar mobs S2S 2x10 LAQ LLE 3lb 2x10 LLE HS curls green 2x10 4in step ups x10 each Heel raises black bar 2x10 Leg press 20lb 2x10  Lateral 4 in step ups x 10 each   02/12/24 Nustep level 4 x 5 minutes Knee is a little more swollen today  Bike level 1 x 5 minutes partial revolutions to start then progressed to full Leg press no weight working on ROM both legs and then left only Feet on ball K2C, small bridge, isometric abs SAQ 3# 2x10 Passive stretch left LE measured ROM as above  02/10/24 PROM to L knee flex/ext, patellar mobs, distraction LAQ 2# 2x10 HS curls green 2x10 Calf stretch 15s x3 NuStep L5 x30mins Bike L1 x40mins  Step ups 4" STS 2x10   02/06/24 Patellar mobs LLE L knee PROM Nustep L3 LAQ 2x10 Red band HS curl 2x10 Sit to stand from elevated  2x10 Walking 111ft without cane x2  4in step ups 2x10   02/03/24 Evaluation  Vaso, left knee medium pressure 34 degrees   PATIENT EDUCATION:  Education details: HEP/POC Person educated: Patient Education method: Programmer, multimedia, Demonstration, Tactile cues, and Verbal cues Education comprehension: verbalized understanding  HOME EXERCISE PROGRAM: Gave her the seated knee flexion and then scoot forward for increased knee flexion  ASSESSMENT:  CLINICAL IMPRESSION: Pt enters ~ 9 minutes late. Again she arrived doing well with L knee, however, the R one is giving her trouble now in regards to pain. Continued to progress with L knee strength and ROM. Weakness with step ups remain.  No L knee pain during session. Continue to progress as tolerated, really push to get further AROM. She would benefit from skilled PT to further improve her LLE ROM and strength to be able to perform functional tasks.    OBJECTIVE IMPAIRMENTS: Abnormal gait, cardiopulmonary status limiting activity, decreased activity tolerance, decreased balance, decreased endurance, decreased mobility, difficulty walking, decreased ROM, decreased strength, increased edema, increased fascial restrictions, impaired perceived functional ability, increased muscle spasms, impaired flexibility, improper body mechanics, postural dysfunction, and pain.   REHAB POTENTIAL: Good  CLINICAL DECISION MAKING: Evolving/moderate complexity  EVALUATION COMPLEXITY: Moderate   GOALS: Goals reviewed with patient? Yes  SHORT TERM GOALS: Target date: 02/19/24 Independent with initial HEP Baseline: Goal status: MET 02/10/24  LONG TERM GOALS: Target date: 05/04/24  Independent with RICE Baseline:  Goal status: met 02/13/24  2.  Independent with advanced HEP Baseline:  Goal status: ongoing 03/12/24  3.  Decrease pain 50% Baseline:  Goal status: MET 03/04/24  4.  Increase AROM to 0-120 degrees flexion Baseline:  Goal status: ongoing 02/13/24, Progressing 02/20/24, ongoing 03/06/24, ongoing 03/12/24  5.  Walk without device Baseline:  Goal status: MET 03/04/24  6.  Return to the gym Baseline:  Goal status:  Progressing 02/20/24, 03/12/24   PLAN:  PT FREQUENCY: 2x/week  PT DURATION: 12 weeks  PLANNED INTERVENTIONS: 97164- PT Re-evaluation, 97110-Therapeutic exercises, 97530- Therapeutic activity, 97112- Neuromuscular re-education, 97535- Self Care, 97140- Manual therapy, [phone]- Gait training, [phone]- Electrical stimulation (unattended), 97016- Vasopneumatic device, Patient/Family education, Balance training, Stair training, Taping, Joint mobilization, Scar mobilization, and Cryotherapy  PLAN FOR NEXT SESSION: work on strength, ROM and function   Ollen Beverage, PTA 03/16/2024, 2:42 PM

## 2024-03-18 NOTE — Telephone Encounter (Signed)
 Scheduled

## 2024-03-19 ENCOUNTER — Ambulatory Visit: Admitting: Physical Therapy

## 2024-03-19 ENCOUNTER — Ambulatory Visit: Admitting: Sports Medicine

## 2024-03-19 ENCOUNTER — Encounter: Payer: Self-pay | Admitting: Physical Therapy

## 2024-03-19 VITALS — BP 118/72 | HR 71 | Ht 64.0 in

## 2024-03-19 DIAGNOSIS — R262 Difficulty in walking, not elsewhere classified: Secondary | ICD-10-CM

## 2024-03-19 DIAGNOSIS — G8929 Other chronic pain: Secondary | ICD-10-CM | POA: Diagnosis not present

## 2024-03-19 DIAGNOSIS — M25662 Stiffness of left knee, not elsewhere classified: Secondary | ICD-10-CM | POA: Diagnosis not present

## 2024-03-19 DIAGNOSIS — R600 Localized edema: Secondary | ICD-10-CM

## 2024-03-19 DIAGNOSIS — M25561 Pain in right knee: Secondary | ICD-10-CM | POA: Diagnosis not present

## 2024-03-19 DIAGNOSIS — M1711 Unilateral primary osteoarthritis, right knee: Secondary | ICD-10-CM

## 2024-03-19 DIAGNOSIS — M25562 Pain in left knee: Secondary | ICD-10-CM

## 2024-03-19 NOTE — Therapy (Signed)
 OUTPATIENT PHYSICAL THERAPY LOWER EXTREMITY TREATMENT P    Patient Name: Sara Nicholson MRN: [id number] DOB:1956-08-08, 68 y.o., female Today's Date: 03/19/2024  END OF SESSION:  PT End of Session - 03/19/24 0848     Visit Number 13    Date for PT Re-Evaluation 05/04/24    PT Start Time 0845    PT Stop Time 0930    PT Time Calculation (min) 45 min    Activity Tolerance Patient tolerated treatment well    Behavior During Therapy [facility] for tasks assessed/performed              Past Medical History:  Diagnosis Date   Arthritis    Cancer (HCC)    squamous lesion right shoulder   Depression    Hypertension    Neuromuscular disorder (HCC)    Parkinson disease (HCC)    Pneumonia 1988   Past Surgical History:  Procedure Laterality Date   APPENDECTOMY     BREAST EXCISIONAL BIOPSY Left    benign cyst   CESAREAN SECTION     x1   COLONOSCOPY     TOTAL KNEE ARTHROPLASTY Left 01/17/2024   Procedure: ARTHROPLASTY, KNEE, TOTAL;  Surgeon: Arnie Lao, MD;  Location: [facility];  Service: Orthopedics;  Laterality: Left;   Patient Active Problem List   Diagnosis Date Noted   Status post total left knee replacement 01/17/2024   Unilateral primary osteoarthritis, left knee 10/30/2023   Hyperlipidemia 11/08/2022   Class 2 severe obesity due to excess calories with serious comorbidity and body mass index (BMI) of 37.0 to 37.9 in adult [facility]) 11/08/2022   Parkinson's disease (HCC) 01/16/2021    PCP: Pauletta Boroughs, MD  REFERRING PROVIDER: Lucienne Ryder, MD  REFERRING DIAG: S/P left TKA  THERAPY DIAG:  Acute pain of left knee  Stiffness of left knee, not elsewhere classified  Difficulty in walking, not elsewhere classified  Localized edema  Rationale for Evaluation and Treatment: Rehabilitation  ONSET DATE: 01/17/24  SUBJECTIVE:   SUBJECTIVE STATEMENT: Iced her R knee and it feels better  PERTINENT HISTORY: PD PAIN:  Are you having pain? Yes: NPRS scale: 0/10 Pain  location: Right knee  Pain description: ache sore Aggravating factors: walking, bending pain up 4/10 Relieving factors: mm relaxer and ice help can b0e 0/10  PRECAUTIONS: None  RED FLAGS: None   WEIGHT BEARING RESTRICTIONS: No  FALLS:  Has patient fallen in last 6 months? No  LIVING ENVIRONMENT: Lives with: lives with their family Lives in: House/apartment Stairs: Yes: External: 5 steps; can reach both Has following equipment at home: Single point cane, Walker - 2 wheeled, Marine scientist  OCCUPATION: retired  PLOF: Independent and goes to the gym at least 3x/week  PATIENT GOALS: get back to the gym  NEXT MD VISIT: 03/25/24  OBJECTIVE:  Note: Objective measures were completed at Evaluation unless otherwise noted.  DIAGNOSTIC FINDINGS: n/a  PATIENT SURVEYS:  LEFS 40%  COGNITION: Overall cognitive status: Within functional limits for tasks assessed     SENSATION: WFL  EDEMA:  Circumferential: left mid patella 55cm, right 50 cm  MUSCLE LENGTH:  PALPATION: Swollen, mild warmth , tender to palpation medial and lateral knee  LOWER EXTREMITY ROM:  Active ROM AROM left eval PROM Left eval AROM Left  02/13/24 AROM Left 02/20/24 AROM Left 03/06/24 AROM Left 03/13/24 AROM 03/19/24  Hip flexion         Hip extension         Hip abduction  Hip adduction         Hip internal rotation         Hip external rotation         Knee flexion 90 100 99 108 110 112 114  Knee extension 10 5 7 4 4 2 2   Ankle dorsiflexion         Ankle plantarflexion         Ankle inversion         Ankle eversion          (Blank rows = not tested)  LOWER EXTREMITY MMT:  MMT Right eval Left eval  Hip flexion    Hip extension    Hip abduction    Hip adduction    Hip internal rotation    Hip external rotation    Knee flexion  4  Knee extension  4  Ankle dorsiflexion    Ankle plantarflexion    Ankle inversion    Ankle eversion     (Blank rows = not tested)  LOWER  EXTREMITY SPECIAL TESTS:  Knee special tests: Patella tap test (ballotable patella): positive  and mild quad lag  FUNCTIONAL TESTS:  5 times sit to stand: 29 seconds with need to use hands Timed up and go (TUG): 21 seconds with SPC  GAIT: Distance walked: 60 feet Assistive device utilized: Single point cane Level of assistance: Modified independence Comments: slow, antalgic on the left                                                                                                                                TREATMENT DATE: 6/12/2 NuStep L 5 x 6 min L knee PROM with end range holds   L tibiofemoral and patellar mobs Sit to stands holding yellow ball 6in step ups 2x10 4in lateral step up 2x10 HS curls 25lb 2x15, LLE 20# x10  Leg Ext 15lb 2x15, LLE 5# x10 Heel raises 2x10  03/16/24 NuStep L 5 x 6 min Resisted gait 30# 4 way x3 6in step ups  Leg Ext 15lb 2x10, LLE 5# x10 HS curls 35lb 2x10, LLE 20# x10  L knee PROM with end range holds   L tibiofemoral and patellar mobs Heel raises 2x10  03/13/24 NuStep L 5 x 5 min LE only  L knee PROM with end range holds   L tibiofemoral and patellar mobs S2S 2x10 6in step ups  Leg Ext 15lb 2x10, LLE 5# 2x10 HS curls 35lb 2x10, LLE 20# x10  6in lateral step usp   03/12/24 Bike L3 x33mins Leg Ext 15lb 2x10, LLE 5# x10 HS curls 35lb 2x10, LLE 20# x10  Step ups 6 AROM- 112d flex, 2d ext Kneeling on half moon bolster in bars and lunges x10 Chairs squats 2x10    03/06/24 Bike L3 x63mins  Resisted gait 30# 4 way x4  2 way hip 5# 2x10 Calf stretch 15s x2   4 step ups  4 lateral step ups  PROM AROM-- 110d flexion, 4d extension  HS stretch  TKE green 2x10  03/04/24 Bike L2 x86mins  Calf stretch 30s x2 Step ups 6  Floor transfer  HS curls 35lb 2x10, LLE 20# x10  Leg Ext 15lb 2x10, LLE 5# x10 Leg press 40# 2x10, LLE 20# x10 Chair squats x10   02/24/24 NuStep L 5 x 6 min 30lb Resisted gait 4 way x4 each 6 in step ups Lle  x10, x5 Leg press 40lb 2x10, LLE 20lb 2x5 L knee PROM with end range holds   L tibiofemoral and patellar mobs LAQ  LLE 3lb 2x10  02/20/24 Bike L 2 x 6 min L knee PROM with end range holds   L tibiofemoral and patellar mobs Sit to stand holding yellow ball 2x10 HS curls 25lb 2x10 Leg Ext 10lb 2x10 Leg press 40lb 2x10 6in step ups 2x10each LAQ  LLE 3lb 2x10 HS curls green 2x10   PATIENT EDUCATION:  Education details: HEP/POC Person educated: Patient Education method: Programmer, multimedia, Demonstration, Tactile cues, and Verbal cues Education comprehension: verbalized understanding  HOME EXERCISE PROGRAM: Gave her the seated knee flexion and then scoot forward for increased knee flexion  ASSESSMENT:  CLINICAL IMPRESSION:  Again she arrived doing well with L knee, Less pain in the R knee after icing today. Continued to progress with L knee strength and ROM. Weakness with step ups remain.  ROM is good overall. She stated that she will be recieving an injection in her R knee today to help with pain. She would benefit from skilled PT to further improve her LLE ROM and strength to be able to perform functional tasks.   OBJECTIVE IMPAIRMENTS: Abnormal gait, cardiopulmonary status limiting activity, decreased activity tolerance, decreased balance, decreased endurance, decreased mobility, difficulty walking, decreased ROM, decreased strength, increased edema, increased fascial restrictions, impaired perceived functional ability, increased muscle spasms, impaired flexibility, improper body mechanics, postural dysfunction, and pain.   REHAB POTENTIAL: Good  CLINICAL DECISION MAKING: Evolving/moderate complexity  EVALUATION COMPLEXITY: Moderate   GOALS: Goals reviewed with patient? Yes  SHORT TERM GOALS: Target date: 02/19/24 Independent with initial HEP Baseline: Goal status: MET 02/10/24  LONG TERM GOALS: Target date: 05/04/24  Independent with RICE Baseline:  Goal status: met  02/13/24  2.  Independent with advanced HEP Baseline:  Goal status: ongoing 03/12/24  3.  Decrease pain 50% Baseline:  Goal status: MET 03/04/24  4.  Increase AROM to 0-120 degrees flexion Baseline:  Goal status: ongoing 02/13/24, Progressing 02/20/24, ongoing 03/06/24, ongoing 03/12/24  5.  Walk without device Baseline:  Goal status: MET 03/04/24  6.  Return to the gym Baseline:  Goal status: Progressing 02/20/24, 03/12/24, 03/19/24 progressing went to the gym ysterday   PLAN:  PT FREQUENCY: 2x/week  PT DURATION: 12 weeks  PLANNED INTERVENTIONS: 97164- PT Re-evaluation, 97110-Therapeutic exercises, 97530- Therapeutic activity, 97112- Neuromuscular re-education, 97535- Self Care, 19147- Manual therapy, [phone]- Gait training, [phone]- Electrical stimulation (unattended), 97016- Vasopneumatic device, Patient/Family education, Balance training, Stair training, Taping, Joint mobilization, Scar mobilization, and Cryotherapy  PLAN FOR NEXT SESSION: work on strength, ROM and function   Ollen Beverage, PTA 03/19/2024, 8:49 AM

## 2024-03-19 NOTE — Progress Notes (Signed)
 Ben Elna Radovich D.[facility] [address] [place] [address] Phone: [phone]   Assessment and Plan:     1. Primary osteoarthritis of right knee 2. Chronic pain of right knee  -Chronic with exacerbation, subsequent visit - Recurrence of right knee pain most consistent with recurrent flare of osteoarthritis - Patient has received relief temporarily with Zilretta  and HA injections in the past.  Patient is interested in right knee surgery after doing well and recovering from left knee surgery.  We discussed that orthopedic surgeons typically prefer to wait at least 2 months after an intra-articular CSI before performing surgery, so since patient would consider surgery soon, we elected to hold off and not perform Zilretta  injection at today's visit.  If patient decides to postpone surgery, patient could follow-up for Zilretta  injection - Recommend Tylenol  for day-to-day pain relief  Pertinent previous records reviewed include none  Follow Up: As needed.  If patient decides to postpone surgery, could consider Zilretta  injection   Subjective:    Chief Complaint: right knee pain   HPI:  01/24/2023 Patient is a 68 year old female complaining of knee pain. Patient states pain is occurring in the posterior aspect of the knee as well as the anterior aspect.  She is active and exercise on a regular basis. She has had pain for a month  She felt a twinge in the knee when she was performing Zumba.  She has swelling in her knee, no numbness or tingling    Knee Pain  The incident occurred a two weeks  ago. The incident occurred at the gym. The injury mechanism was a twisting injury. The pain is present in the right knee. The quality of the pain is described as aching. The pain is severe. The pain has been Constant since onset. Pertinent negatives include no inability to bear weight, loss of motion, loss of sensation, muscle weakness, numbness or tingling. She  reports no foreign bodies present. The symptoms are aggravated by movement, palpation and weight bearing. She has tried acetaminophen  and NSAIDs for the symptoms. The treatment provided mild relief.  Onset after kick boxing and zumba class.   02/13/2023 Patient states that her knee is a little better, sitting , standing, and walking for a prolonged period is painful , now has medial knee pain , she is frustrated    02/26/2023 Patient states that she is feeling so much better    04/09/2023 Patient states that her right knee is  better just an occasional twinge. Friday night her left knee popped in the back , she had to have help getting up. She has RICEd, she has been improving since Saturday . Her calf and the back of her knee feels tight but that pop had also relieved the pressure she had been feeling   04/30/2023 Patient states right knee is fine . Left knee is killing her    05/14/2023 Patient states tat she is better but, is having some shooting pain down in to her calf that will stop her in her tracks . She has some bruising that appeared Saturday    06/11/2023 Patient states she has intermittent pain. Has a little pain today in the one spot    08/05/2023 Patient states left knee is killing her.  states it stays swollen and getting up and down is painful    08/15/2023 Patient states   08/21/2023 Patient states here for CSI    09/26/2023 Patient states she has been  doing well until this morning. She thinks she might have over extended . She does endorse antalgic gait. But still not as bad as when she came in    10/15/2023 Patient states ready  for CSI. Left leg is swollen, pain at rest    11/21/2023 Patient states here for MRI and steroid injection   03/19/24 Patient is a 68 year old female presenting to [facility] for follow up of right knee pain and a knee injection. Today patient states the knee is giving her a fit and ready for Zilretta  injection. Patient located the  pain to below the knee cap with grinding and popping which is different from her left knee.   Relevant Historical Information: parkinson   Additional pertinent review of systems negative.   Current Outpatient Medications:    meloxicam  (MOBIC ) 15 MG tablet, Take 1 tablet (15 mg total) by mouth daily., Disp: 30 tablet, Rfl: 0   acetaminophen  (TYLENOL ) 500 MG tablet, Take 1,000 mg by mouth every 6 (six) hours as needed for moderate pain (pain score 4-6)., Disp: , Rfl:    aspirin  81 MG chewable tablet, Chew 1 tablet (81 mg total) by mouth 2 (two) times daily., Disp: 30 tablet, Rfl: 0   carbidopa -levodopa  (SINEMET  IR) 25-100 MG tablet, TAKE ONE TABLET BY MOUTH THREE TIMES A DAY ~ 7 IN THE MORNING, 11 IN THE MORNING AND 4PM, Disp: 270 tablet, Rfl: 0   escitalopram  (LEXAPRO ) 20 MG tablet, TAKE ONE TABLET BY MOUTH ONE TIME DAILY, Disp: 90 tablet, Rfl: 0   ibuprofen (ADVIL) 200 MG tablet, Take 400 mg by mouth daily as needed for moderate pain (pain score 4-6)., Disp: , Rfl:    oxyCODONE  (OXY IR/ROXICODONE ) 5 MG immediate release tablet, Take 1-2 tablets (5-10 mg total) by mouth every 6 (six) hours as needed for moderate pain (pain score 4-6) (pain score 4-6)., Disp: 30 tablet, Rfl: 0   pramipexole  (MIRAPEX ) 0.5 MG tablet, TAKE ONE TABLET BY MOUTH THREE TIMES A DAY (8:00AM, 1:00PM, AND 6:00PM), Disp: 270 tablet, Rfl: 0   tiZANidine  (ZANAFLEX ) 4 MG tablet, Take 1 tablet (4 mg total) by mouth every 6 (six) hours as needed for muscle spasms., Disp: 30 tablet, Rfl: 0   Objective:     Vitals:   03/19/24 1008  BP: 118/72  Pulse: 71  SpO2: 95%  Height: 5' 4 (1.626 m)      Body mass index is 37.76 kg/m.    Physical Exam:    General:  awake, alert oriented, no acute distress nontoxic Skin: no suspicious lesions or rashes Neuro:sensation intact and strength 5/5 with no deficits, no atrophy, normal muscle tone Psych: No signs of anxiety, depression or other mood disorder  Right knee: Mild  swelling No deformity Positive fluid wave, joint milking ROM Flex 110, Ext 0 TTP medial and lateral joint line, patella, patellar tendon, tibial tuberosity NTTP over the quad tendon, fibular head, posterior fossa, pes anserine bursa, gerdy's tubercle,    Gait antalgic, favoring left leg   Electronically signed by:  Marshall Skeeter D.[facility] 10:34 AM 03/19/24

## 2024-03-23 ENCOUNTER — Ambulatory Visit

## 2024-03-23 DIAGNOSIS — R262 Difficulty in walking, not elsewhere classified: Secondary | ICD-10-CM | POA: Diagnosis not present

## 2024-03-23 DIAGNOSIS — R600 Localized edema: Secondary | ICD-10-CM | POA: Diagnosis not present

## 2024-03-23 DIAGNOSIS — M25562 Pain in left knee: Secondary | ICD-10-CM

## 2024-03-23 DIAGNOSIS — M25662 Stiffness of left knee, not elsewhere classified: Secondary | ICD-10-CM | POA: Diagnosis not present

## 2024-03-23 NOTE — Therapy (Signed)
 OUTPATIENT PHYSICAL THERAPY LOWER EXTREMITY TREATMENT     Patient Name: MASSIAH LONGANECKER MRN: [id number] DOB:[birth year], 68 y.o., female Today's Date: 03/23/2024  END OF SESSION:  PT End of Session - 03/23/24 1321     Visit Number 14    Date for PT Re-Evaluation 05/04/24    Authorization Type [employer]    PT Start Time 1320    PT Stop Time 1400    PT Time Calculation (min) 40 min    Activity Tolerance Patient tolerated treatment well    Behavior During Therapy WFL for tasks assessed/performed               Past Medical History:  Diagnosis Date   Arthritis    Cancer (HCC)    squamous lesion right shoulder   Depression    Hypertension    Neuromuscular disorder (HCC)    Parkinson disease (HCC)    Pneumonia 1988   Past Surgical History:  Procedure Laterality Date   APPENDECTOMY     BREAST EXCISIONAL BIOPSY Left    benign cyst   CESAREAN SECTION     x1   COLONOSCOPY     TOTAL KNEE ARTHROPLASTY Left 01/17/2024   Procedure: ARTHROPLASTY, KNEE, TOTAL;  Surgeon: Arnie Lao, MD;  Location: [facility];  Service: Orthopedics;  Laterality: Left;   Patient Active Problem List   Diagnosis Date Noted   Status post total left knee replacement 01/17/2024   Unilateral primary osteoarthritis, left knee 10/30/2023   Hyperlipidemia 11/08/2022   Class 2 severe obesity due to excess calories with serious comorbidity and body mass index (BMI) of 37.0 to 37.9 in adult [facility]) 11/08/2022   Parkinson's disease (HCC) 01/16/2021    PCP: Pauletta Boroughs, MD  REFERRING PROVIDER: Lucienne Ryder, MD  REFERRING DIAG: S/P left TKA  THERAPY DIAG:  Acute pain of left knee  Stiffness of left knee, not elsewhere classified  Difficulty in walking, not elsewhere classified  Rationale for Evaluation and Treatment: Rehabilitation  ONSET DATE: 01/17/24  SUBJECTIVE:   SUBJECTIVE STATEMENT: The R side is bad, the L one is wonderful.   PERTINENT HISTORY: PD PAIN:  Are you having pain? Yes: NPRS  scale: 0/10 Pain location: Right knee  Pain description: ache sore Aggravating factors: walking, bending pain up 4/10 Relieving factors: mm relaxer and ice help can b0e 0/10  PRECAUTIONS: None  RED FLAGS: None   WEIGHT BEARING RESTRICTIONS: No  FALLS:  Has patient fallen in last 6 months? No  LIVING ENVIRONMENT: Lives with: lives with their family Lives in: House/apartment Stairs: Yes: External: 5 steps; can reach both Has following equipment at home: Single point cane, Walker - 2 wheeled, Marine scientist  OCCUPATION: retired  PLOF: Independent and goes to the gym at least 3x/week  PATIENT GOALS: get back to the gym  NEXT MD VISIT: 03/25/24  OBJECTIVE:  Note: Objective measures were completed at Evaluation unless otherwise noted.  DIAGNOSTIC FINDINGS: n/a  PATIENT SURVEYS:  LEFS 40%  COGNITION: Overall cognitive status: Within functional limits for tasks assessed     SENSATION: WFL  EDEMA:  Circumferential: left mid patella 55cm, right 50 cm  MUSCLE LENGTH:  PALPATION: Swollen, mild warmth , tender to palpation medial and lateral knee  LOWER EXTREMITY ROM:  Active ROM AROM left eval PROM Left eval AROM Left  02/13/24 AROM Left 02/20/24 AROM Left 03/06/24 AROM Left 03/13/24 AROM 03/19/24  Hip flexion         Hip extension  Hip abduction         Hip adduction         Hip internal rotation         Hip external rotation         Knee flexion 90 100 99 108 110 112 114  Knee extension 10 5 7 4 4 2 2   Ankle dorsiflexion         Ankle plantarflexion         Ankle inversion         Ankle eversion          (Blank rows = not tested)  LOWER EXTREMITY MMT:  MMT Right eval Left eval  Hip flexion    Hip extension    Hip abduction    Hip adduction    Hip internal rotation    Hip external rotation    Knee flexion  4  Knee extension  4  Ankle dorsiflexion    Ankle plantarflexion    Ankle inversion    Ankle eversion     (Blank rows = not  tested)  LOWER EXTREMITY SPECIAL TESTS:  Knee special tests: Patella tap test (ballotable patella): positive  and mild quad lag  FUNCTIONAL TESTS:  5 times sit to stand: 29 seconds with need to use hands Timed up and go (TUG): 21 seconds with SPC  GAIT: Distance walked: 60 feet Assistive device utilized: Single point cane Level of assistance: Modified independence Comments: slow, antalgic on the left                                                                                                                                TREATMENT DATE: 03/23/24 NuStep L5x55mins  Step up from airex to 6 forward and lateral Calf stretch 30s  Leg press 50# 2x10, LLE 20# x10 STS from chair 2x10 HS curls 35lb 2x15, LLE 20# x10  Leg Ext 15lb 2x15, LLE 5# x10 Lunges on step x10 each side    03/19/24 NuStep L 5 x 6 min L knee PROM with end range holds   L tibiofemoral and patellar mobs Sit to stands holding yellow ball 6in step ups 2x10 4in lateral step up 2x10 HS curls 25lb 2x15, LLE 20# x10  Leg Ext 15lb 2x15, LLE 5# x10 Heel raises 2x10  03/16/24 NuStep L 5 x 6 min Resisted gait 30# 4 way x3 6in step ups  Leg Ext 15lb 2x10, LLE 5# x10 HS curls 35lb 2x10, LLE 20# x10  L knee PROM with end range holds   L tibiofemoral and patellar mobs Heel raises 2x10  03/13/24 NuStep L 5 x 5 min LE only  L knee PROM with end range holds   L tibiofemoral and patellar mobs S2S 2x10 6in step ups  Leg Ext 15lb 2x10, LLE 5# 2x10 HS curls 35lb 2x10, LLE 20# x10  6in lateral step usp   03/12/24 Bike L3 x25mins Leg Ext 15lb  2x10, LLE 5# x10 HS curls 35lb 2x10, LLE 20# x10  Step ups 6 AROM- 112d flex, 2d ext Kneeling on half moon bolster in bars and lunges x10 Chairs squats 2x10    03/06/24 Bike L3 x21mins  Resisted gait 30# 4 way x4  2 way hip 5# 2x10 Calf stretch 15s x2   4 step ups  4 lateral step ups  PROM AROM-- 110d flexion, 4d extension  HS stretch  TKE green 2x10  03/04/24 Bike  L2 x56mins  Calf stretch 30s x2 Step ups 6  Floor transfer  HS curls 35lb 2x10, LLE 20# x10  Leg Ext 15lb 2x10, LLE 5# x10 Leg press 40# 2x10, LLE 20# x10 Chair squats x10   02/24/24 NuStep L 5 x 6 min 30lb Resisted gait 4 way x4 each 6 in step ups Lle x10, x5 Leg press 40lb 2x10, LLE 20lb 2x5 L knee PROM with end range holds   L tibiofemoral and patellar mobs LAQ  LLE 3lb 2x10  02/20/24 Bike L 2 x 6 min L knee PROM with end range holds   L tibiofemoral and patellar mobs Sit to stand holding yellow ball 2x10 HS curls 25lb 2x10 Leg Ext 10lb 2x10 Leg press 40lb 2x10 6in step ups 2x10each LAQ  LLE 3lb 2x10 HS curls green 2x10   PATIENT EDUCATION:  Education details: HEP/POC Person educated: Patient Education method: Programmer, multimedia, Demonstration, Tactile cues, and Verbal cues Education comprehension: verbalized understanding  HOME EXERCISE PROGRAM: Gave her the seated knee flexion and then scoot forward for increased knee flexion  ASSESSMENT:  CLINICAL IMPRESSION: Patient is doing well with L knee, but the R knee is giving her trouble. Continued to progress with L knee strength and ROM. She will see her doctor on Wednesday to see what they say about the R knee.   OBJECTIVE IMPAIRMENTS: Abnormal gait, cardiopulmonary status limiting activity, decreased activity tolerance, decreased balance, decreased endurance, decreased mobility, difficulty walking, decreased ROM, decreased strength, increased edema, increased fascial restrictions, impaired perceived functional ability, increased muscle spasms, impaired flexibility, improper body mechanics, postural dysfunction, and pain.   REHAB POTENTIAL: Good  CLINICAL DECISION MAKING: Evolving/moderate complexity  EVALUATION COMPLEXITY: Moderate   GOALS: Goals reviewed with patient? Yes  SHORT TERM GOALS: Target date: 02/19/24 Independent with initial HEP Baseline: Goal status: MET 02/10/24  LONG TERM GOALS: Target date:  05/04/24  Independent with RICE Baseline:  Goal status: met 02/13/24  2.  Independent with advanced HEP Baseline:  Goal status: ongoing 03/12/24  3.  Decrease pain 50% Baseline:  Goal status: MET 03/04/24  4.  Increase AROM to 0-120 degrees flexion Baseline:  Goal status: ongoing 02/13/24, Progressing 02/20/24, ongoing 03/06/24, ongoing 03/12/24  5.  Walk without device Baseline:  Goal status: MET 03/04/24  6.  Return to the gym Baseline:  Goal status: Progressing 02/20/24, 03/12/24, 03/19/24 progressing went to the gym ysterday   PLAN:  PT FREQUENCY: 2x/week  PT DURATION: 12 weeks  PLANNED INTERVENTIONS: 97164- PT Re-evaluation, 97110-Therapeutic exercises, 97530- Therapeutic activity, 97112- Neuromuscular re-education, 97535- Self Care, 40981- Manual therapy, [phone]- Gait training, [phone]- Electrical stimulation (unattended), 97016- Vasopneumatic device, Patient/Family education, Balance training, Stair training, Taping, Joint mobilization, Scar mobilization, and Cryotherapy  PLAN FOR NEXT SESSION: work on strength, ROM and function   [employer], PT 03/23/2024, 1:59 PM

## 2024-03-25 ENCOUNTER — Ambulatory Visit: Admitting: Orthopaedic Surgery

## 2024-03-25 ENCOUNTER — Encounter: Payer: Self-pay | Admitting: Orthopaedic Surgery

## 2024-03-25 DIAGNOSIS — M25561 Pain in right knee: Secondary | ICD-10-CM | POA: Diagnosis not present

## 2024-03-25 DIAGNOSIS — Z96652 Presence of left artificial knee joint: Secondary | ICD-10-CM

## 2024-03-25 DIAGNOSIS — M1711 Unilateral primary osteoarthritis, right knee: Secondary | ICD-10-CM

## 2024-03-25 DIAGNOSIS — G8929 Other chronic pain: Secondary | ICD-10-CM | POA: Diagnosis not present

## 2024-03-25 NOTE — Progress Notes (Signed)
 The patient is now 10 weeks status post a left total knee replacement and she is doing very well.  She says the knee is great.  She has good range of motion of that knee she states.  Her right knee has been grinding quite a bit and her pain has gotten quite severe in her right knee.  This knee had been worked up a year ago and an MRI of the right knee showed diffuse subchondral edema throughout the weightbearing surface of the right knee especially the medial compartment with areas of full-thickness cartilage loss.  An MRI of the right knee showed diffuse subchondral edema throughout the weightbearing surface of the right knee especially the medial compartment with areas of full-thickness cartilage loss.  She is ready to proceed with knee replacement on the right side.  Her right knee pain is daily and is definitely affecting her mobility, her quality life and her actives daily living.  She has tried and failed conservative treatment for that knee for over a year now including activity modification, weight loss, physical therapy, NSAIDs, and injections.  Her left operative knee is nice and straight.  Her extension is full and her flexion is full.  The knee feels  stable.  Her right knee has slight varus malalignment with significant medial joint line tenderness and a grinding sensation as well as patellofemoral crepitation.  I did go over the MRI of her right knee from last year showing diffuse edema throughout the medial compartment of her right knee with significant arthritic changes and full-thickness cartilage loss in the medial compartment and patellofemoral joint.  At this point she is ready to proceed with a right total knee arthroplasty.  We will work on getting her on the schedule for the surgery.  Having had this before she is fully aware of the risk and benefits of the surgery and does wish to proceed.

## 2024-03-26 ENCOUNTER — Encounter: Payer: Self-pay | Admitting: Physical Therapy

## 2024-03-26 ENCOUNTER — Ambulatory Visit: Admitting: Physical Therapy

## 2024-03-26 DIAGNOSIS — R262 Difficulty in walking, not elsewhere classified: Secondary | ICD-10-CM | POA: Diagnosis not present

## 2024-03-26 DIAGNOSIS — M25562 Pain in left knee: Secondary | ICD-10-CM

## 2024-03-26 DIAGNOSIS — R600 Localized edema: Secondary | ICD-10-CM

## 2024-03-26 DIAGNOSIS — M25662 Stiffness of left knee, not elsewhere classified: Secondary | ICD-10-CM | POA: Diagnosis not present

## 2024-03-26 NOTE — Therapy (Signed)
 OUTPATIENT PHYSICAL THERAPY LOWER EXTREMITY TREATMENT     Patient Name: Sara Nicholson MRN: [id number] DOB:05/05/1956, 68 y.o., female Today's Date: 03/26/2024  END OF SESSION:  PT End of Session - 03/26/24 0846     Visit Number 15    Date for PT Re-Evaluation 05/04/24    PT Start Time 0846    PT Stop Time 0930    PT Time Calculation (min) 44 min    Activity Tolerance Patient tolerated treatment well    Behavior During Therapy [facility] for tasks assessed/performed               Past Medical History:  Diagnosis Date   Arthritis    Cancer (HCC)    squamous lesion right shoulder   Depression    Hypertension    Neuromuscular disorder (HCC)    Parkinson disease (HCC)    Pneumonia 1988   Past Surgical History:  Procedure Laterality Date   APPENDECTOMY     BREAST EXCISIONAL BIOPSY Left    benign cyst   CESAREAN SECTION     x1   COLONOSCOPY     TOTAL KNEE ARTHROPLASTY Left 01/17/2024   Procedure: ARTHROPLASTY, KNEE, TOTAL;  Surgeon: Arnie Lao, MD;  Location: [facility];  Service: Orthopedics;  Laterality: Left;   Patient Active Problem List   Diagnosis Date Noted   Status post total left knee replacement 01/17/2024   Hyperlipidemia 11/08/2022   Class 2 severe obesity due to excess calories with serious comorbidity and body mass index (BMI) of 37.0 to 37.9 in adult [facility]) 11/08/2022   Parkinson's disease (HCC) 01/16/2021    PCP: Pauletta Boroughs, MD  REFERRING PROVIDER: Lucienne Ryder, MD  REFERRING DIAG: S/P left TKA  THERAPY DIAG:  Acute pain of left knee  Stiffness of left knee, not elsewhere classified  Localized edema  Difficulty in walking, not elsewhere classified  Rationale for Evaluation and Treatment: Rehabilitation  ONSET DATE: 01/17/24  SUBJECTIVE:   SUBJECTIVE STATEMENT: Went to see the surgeon yesterday, Doing this again in 5 to 6 weeks  PERTINENT HISTORY: PD PAIN:  Are you having pain? Yes: NPRS scale: 0/10 Pain location: Right knee   Pain description: ache sore Aggravating factors: walking, bending pain up 4/10 Relieving factors: mm relaxer and ice help can b0e 0/10  PRECAUTIONS: None  RED FLAGS: None   WEIGHT BEARING RESTRICTIONS: No  FALLS:  Has patient fallen in last 6 months? No  LIVING ENVIRONMENT: Lives with: lives with their family Lives in: House/apartment Stairs: Yes: External: 5 steps; can reach both Has following equipment at home: Single point cane, Walker - 2 wheeled, Marine scientist  OCCUPATION: retired  PLOF: Independent and goes to the gym at least 3x/week  PATIENT GOALS: get back to the gym  NEXT MD VISIT: 03/25/24  OBJECTIVE:  Note: Objective measures were completed at Evaluation unless otherwise noted.  DIAGNOSTIC FINDINGS: n/a  PATIENT SURVEYS:  LEFS 40%  COGNITION: Overall cognitive status: Within functional limits for tasks assessed     SENSATION: WFL  EDEMA:  Circumferential: left mid patella 55cm, right 50 cm  MUSCLE LENGTH:  PALPATION: Swollen, mild warmth , tender to palpation medial and lateral knee  LOWER EXTREMITY ROM:  Active ROM AROM left eval PROM Left eval AROM Left  02/13/24 AROM Left 02/20/24 AROM Left 03/06/24 AROM Left 03/13/24 AROM 03/19/24  Hip flexion         Hip extension         Hip abduction  Hip adduction         Hip internal rotation         Hip external rotation         Knee flexion 90 100 99 108 110 112 114  Knee extension 10 5 7 4 4 2 2   Ankle dorsiflexion         Ankle plantarflexion         Ankle inversion         Ankle eversion          (Blank rows = not tested)  LOWER EXTREMITY MMT:  MMT Right eval Left eval  Hip flexion    Hip extension    Hip abduction    Hip adduction    Hip internal rotation    Hip external rotation    Knee flexion  4  Knee extension  4  Ankle dorsiflexion    Ankle plantarflexion    Ankle inversion    Ankle eversion     (Blank rows = not tested)  LOWER EXTREMITY SPECIAL TESTS:   Knee special tests: Patella tap test (ballotable patella): positive  and mild quad lag  FUNCTIONAL TESTS:  5 times sit to stand: 29 seconds with need to use hands Timed up and go (TUG): 21 seconds with SPC  GAIT: Distance walked: 60 feet Assistive device utilized: Single point cane Level of assistance: Modified independence Comments: slow, antalgic on the left                                                                                                                                TREATMENT DATE: 03/26/24 NuStep L5x 5 mins  Step up from airex to 6 box  forward and lateral HS curls 35lb 2x15, LLE 20# x10  Leg Ext 15lb 2x15, LLE 5# 2x10 STS LE on airex holding yellow ball  2x10 L knee PROM with end range holds   L tibiofemoral and patellar mobs  STM to L quad with VMO emphasize  03/23/24 NuStep L5x 5 mins  Step up from Direx to 6 forward and lateral Calf stretch 30s  Leg press 50# 2x10, LLE 20# x10 STS from chair 2x10 HS curls 35lb 2x15, LLE 20# x10  Leg Ext 15lb 2x15, LLE 5# x10 Lunges on step x10 each side    03/19/24 NuStep L 5 x 6 min L knee PROM with end range holds   L tibiofemoral and patellar mobs Sit to stands holding yellow ball 6in step ups 2x10 4in lateral step up 2x10 HS curls 25lb 2x15, LLE 20# x10  Leg Ext 15lb 2x15, LLE 5# x10 Heel raises 2x10  03/16/24 NuStep L 5 x 6 min Resisted gait 30# 4 way x3 6in step ups  Leg Ext 15lb 2x10, LLE 5# x10 HS curls 35lb 2x10, LLE 20# x10  L knee PROM with end range holds   L tibiofemoral and patellar mobs Heel raises 2x10  03/13/24 NuStep  L 5 x 5 min LE only  L knee PROM with end range holds   L tibiofemoral and patellar mobs S2S 2x10 6in step ups  Leg Ext 15lb 2x10, LLE 5# 2x10 HS curls 35lb 2x10, LLE 20# x10  6in lateral step usp   03/12/24 Bike L3 x29mins Leg Ext 15lb 2x10, LLE 5# x10 HS curls 35lb 2x10, LLE 20# x10  Step ups 6 AROM- 112d flex, 2d ext Kneeling on half moon bolster in bars and  lunges x10 Chairs squats 2x10    03/06/24 Bike L3 x77mins  Resisted gait 30# 4 way x4  2 way hip 5# 2x10 Calf stretch 15s x2   4 step ups  4 lateral step ups  PROM AROM-- 110d flexion, 4d extension  HS stretch  TKE green 2x10  03/04/24 Bike L2 x35mins  Calf stretch 30s x2 Step ups 6  Floor transfer  HS curls 35lb 2x10, LLE 20# x10  Leg Ext 15lb 2x10, LLE 5# x10 Leg press 40# 2x10, LLE 20# x10 Chair squats x10   02/24/24 NuStep L 5 x 6 min 30lb Resisted gait 4 way x4 each 6 in step ups Lle x10, x5 Leg press 40lb 2x10, LLE 20lb 2x5 L knee PROM with end range holds   L tibiofemoral and patellar mobs LAQ  LLE 3lb 2x10  02/20/24 Bike L 2 x 6 min L knee PROM with end range holds   L tibiofemoral and patellar mobs Sit to stand holding yellow ball 2x10 HS curls 25lb 2x10 Leg Ext 10lb 2x10 Leg press 40lb 2x10 6in step ups 2x10each LAQ  LLE 3lb 2x10 HS curls green 2x10   PATIENT EDUCATION:  Education details: HEP/POC Person educated: Patient Education method: Programmer, multimedia, Demonstration, Tactile cues, and Verbal cues Education comprehension: verbalized understanding  HOME EXERCISE PROGRAM: Gave her the seated knee flexion and then scoot forward for increased knee flexion  ASSESSMENT:  CLINICAL IMPRESSION: Patient is doing well with L knee, but the R knee is giving her trouble. She reports that's she will be getting her R knee replaced in 5 to 6 weeks. Continued to progress with L knee strength and ROM. Some instability with step ups, weakness with sit to stands on airex.  OBJECTIVE IMPAIRMENTS: Abnormal gait, cardiopulmonary status limiting activity, decreased activity tolerance, decreased balance, decreased endurance, decreased mobility, difficulty walking, decreased ROM, decreased strength, increased edema, increased fascial restrictions, impaired perceived functional ability, increased muscle spasms, impaired flexibility, improper body mechanics, postural  dysfunction, and pain.   REHAB POTENTIAL: Good  CLINICAL DECISION MAKING: Evolving/moderate complexity  EVALUATION COMPLEXITY: Moderate   GOALS: Goals reviewed with patient? Yes  SHORT TERM GOALS: Target date: 02/19/24 Independent with initial HEP Baseline: Goal status: MET 02/10/24  LONG TERM GOALS: Target date: 05/04/24  Independent with RICE Baseline:  Goal status: met 02/13/24  2.  Independent with advanced HEP Baseline:  Goal status: ongoing 03/12/24  3.  Decrease pain 50% Baseline:  Goal status: MET 03/04/24  4.  Increase AROM to 0-120 degrees flexion Baseline:  Goal status: ongoing 02/13/24, Progressing 02/20/24, ongoing 03/06/24, ongoing 03/12/24, Partly met 03/26/24  5.  Walk without device Baseline:  Goal status: MET 03/04/24  6.  Return to the gym Baseline:  Goal status: Progressing 02/20/24, 03/12/24, 03/19/24 progressing went to the gym ysterday   PLAN:  PT FREQUENCY: 2x/week  PT DURATION: 12 weeks  PLANNED INTERVENTIONS: 97164- PT Re-evaluation, 97110-Therapeutic exercises, 97530- Therapeutic activity, 97112- Neuromuscular re-education, 97535- Self Care, 52841- Manual therapy, 97116- Gait  training, [phone]- Electrical stimulation (unattended), [phone]- Vasopneumatic device, Patient/Family education, Balance training, Stair training, Taping, Joint mobilization, Scar mobilization, and Cryotherapy  PLAN FOR NEXT SESSION: work on strength, ROM and function   Ollen Beverage, PTA 03/26/2024, 8:46 AM

## 2024-03-30 ENCOUNTER — Ambulatory Visit

## 2024-03-30 DIAGNOSIS — M25662 Stiffness of left knee, not elsewhere classified: Secondary | ICD-10-CM

## 2024-03-30 DIAGNOSIS — R262 Difficulty in walking, not elsewhere classified: Secondary | ICD-10-CM | POA: Diagnosis not present

## 2024-03-30 DIAGNOSIS — R600 Localized edema: Secondary | ICD-10-CM

## 2024-03-30 DIAGNOSIS — M25562 Pain in left knee: Secondary | ICD-10-CM | POA: Diagnosis not present

## 2024-03-30 NOTE — Therapy (Signed)
 OUTPATIENT PHYSICAL THERAPY LOWER EXTREMITY TREATMENT     Patient Name: Sara Nicholson MRN: [id number] DOB:1956/04/08, 68 y.o., female Today's Date: 03/30/2024  END OF SESSION:  PT End of Session - 03/30/24 0850     Visit Number 16    Date for PT Re-Evaluation 05/04/24    PT Start Time 0850    PT Stop Time 0930    PT Time Calculation (min) 40 min    Activity Tolerance Patient tolerated treatment well    Behavior During Therapy [facility] for tasks assessed/performed                Past Medical History:  Diagnosis Date   Arthritis    Cancer (HCC)    squamous lesion right shoulder   Depression    Hypertension    Neuromuscular disorder (HCC)    Parkinson disease (HCC)    Pneumonia 1988   Past Surgical History:  Procedure Laterality Date   APPENDECTOMY     BREAST EXCISIONAL BIOPSY Left    benign cyst   CESAREAN SECTION     x1   COLONOSCOPY     TOTAL KNEE ARTHROPLASTY Left 01/17/2024   Procedure: ARTHROPLASTY, KNEE, TOTAL;  Surgeon: Vernetta Lonni GRADE, MD;  Location: [facility];  Service: Orthopedics;  Laterality: Left;   Patient Active Problem List   Diagnosis Date Noted   Status post total left knee replacement 01/17/2024   Hyperlipidemia 11/08/2022   Class 2 severe obesity due to excess calories with serious comorbidity and body mass index (BMI) of 37.0 to 37.9 in adult [facility]) 11/08/2022   Parkinson's disease (HCC) 01/16/2021    PCP: HILARIO Sharps, MD  REFERRING PROVIDER: Vernetta, MD  REFERRING DIAG: S/P left TKA  THERAPY DIAG:  Stiffness of left knee, not elsewhere classified  Localized edema  Difficulty in walking, not elsewhere classified  Rationale for Evaluation and Treatment: Rehabilitation  ONSET DATE: 01/17/24  SUBJECTIVE:   SUBJECTIVE STATEMENT: Surgeon is happy about my L leg. Getting the right one done in 5-6 weeks.   PERTINENT HISTORY: PD PAIN:  Are you having pain? Yes: NPRS scale: 0/10 Pain location: Right knee  Pain description:  ache sore Aggravating factors: walking, bending pain up 4/10 Relieving factors: mm relaxer and ice help can b0e 0/10  PRECAUTIONS: None  RED FLAGS: None   WEIGHT BEARING RESTRICTIONS: No  FALLS:  Has patient fallen in last 6 months? No  LIVING ENVIRONMENT: Lives with: lives with their family Lives in: House/apartment Stairs: Yes: External: 5 steps; can reach both Has following equipment at home: Single point cane, Walker - 2 wheeled, Marine scientist  OCCUPATION: retired  PLOF: Independent and goes to the gym at least 3x/week  PATIENT GOALS: get back to the gym  NEXT MD VISIT: 03/25/24  OBJECTIVE:  Note: Objective measures were completed at Evaluation unless otherwise noted.  DIAGNOSTIC FINDINGS: n/a  PATIENT SURVEYS:  LEFS 40%  COGNITION: Overall cognitive status: Within functional limits for tasks assessed     SENSATION: WFL  EDEMA:  Circumferential: left mid patella 55cm, right 50 cm  MUSCLE LENGTH:  PALPATION: Swollen, mild warmth , tender to palpation medial and lateral knee  LOWER EXTREMITY ROM:  Active ROM AROM left eval PROM Left eval AROM Left  02/13/24 AROM Left 02/20/24 AROM Left 03/06/24 AROM Left 03/13/24 AROM 03/19/24  Hip flexion         Hip extension         Hip abduction  Hip adduction         Hip internal rotation         Hip external rotation         Knee flexion 90 100 99 108 110 112 114  Knee extension 10 5 7 4 4 2 2   Ankle dorsiflexion         Ankle plantarflexion         Ankle inversion         Ankle eversion          (Blank rows = not tested)  LOWER EXTREMITY MMT:  MMT Right eval Left eval  Hip flexion    Hip extension    Hip abduction    Hip adduction    Hip internal rotation    Hip external rotation    Knee flexion  4  Knee extension  4  Ankle dorsiflexion    Ankle plantarflexion    Ankle inversion    Ankle eversion     (Blank rows = not tested)  LOWER EXTREMITY SPECIAL TESTS:  Knee special tests:  Patella tap test (ballotable patella): positive  and mild quad lag  FUNCTIONAL TESTS:  5 times sit to stand: 29 seconds with need to use hands Timed up and go (TUG): 21 seconds with SPC  GAIT: Distance walked: 60 feet Assistive device utilized: Single point cane Level of assistance: Modified independence Comments: slow, antalgic on the left                                                                                                                                TREATMENT DATE: 03/30/24 Bike L5 x38mins ROM- 1-120  HS curls 35lb 3x10 Leg Ext 15lb 3x10 Leg press 40# 3x10 STS on airex from low heigh 2x10  Step ups 6   03/26/24 NuStep L5x 5 mins  Step up from airex to 6 box  forward and lateral HS curls 35lb 2x15, LLE 20# x10  Leg Ext 15lb 2x15, LLE 5# 2x10 STS LE on airex holding yellow ball  2x10 L knee PROM with end range holds   L tibiofemoral and patellar mobs  STM to L quad with VMO emphasize  03/23/24 NuStep L5x 5 mins  Step up from Airex to 6 forward and lateral Calf stretch 30s  Leg press 50# 2x10, LLE 20# x10 STS from chair 2x10 HS curls 35lb 2x15, LLE 20# x10  Leg Ext 15lb 2x15, LLE 5# x10 Lunges on step x10 each side    03/19/24 NuStep L 5 x 6 min L knee PROM with end range holds   L tibiofemoral and patellar mobs Sit to stands holding yellow ball 6in step ups 2x10 4in lateral step up 2x10 HS curls 25lb 2x15, LLE 20# x10  Leg Ext 15lb 2x15, LLE 5# x10 Heel raises 2x10  03/16/24 NuStep L 5 x 6 min Resisted gait 30# 4 way x3 6in step ups  Leg Ext 15lb  2x10, LLE 5# x10 HS curls 35lb 2x10, LLE 20# x10  L knee PROM with end range holds   L tibiofemoral and patellar mobs Heel raises 2x10  03/13/24 NuStep L 5 x 5 min LE only  L knee PROM with end range holds   L tibiofemoral and patellar mobs S2S 2x10 6in step ups  Leg Ext 15lb 2x10, LLE 5# 2x10 HS curls 35lb 2x10, LLE 20# x10  6in lateral step usp   03/12/24 Bike L3 x57mins Leg Ext 15lb 2x10,  LLE 5# x10 HS curls 35lb 2x10, LLE 20# x10  Step ups 6 AROM- 112d flex, 2d ext Kneeling on half moon bolster in bars and lunges x10 Chairs squats 2x10    03/06/24 Bike L3 x32mins  Resisted gait 30# 4 way x4  2 way hip 5# 2x10 Calf stretch 15s x2   4 step ups  4 lateral step ups  PROM AROM-- 110d flexion, 4d extension  HS stretch  TKE green 2x10  03/04/24 Bike L2 x44mins  Calf stretch 30s x2 Step ups 6  Floor transfer  HS curls 35lb 2x10, LLE 20# x10  Leg Ext 15lb 2x10, LLE 5# x10 Leg press 40# 2x10, LLE 20# x10 Chair squats x10   02/24/24 NuStep L 5 x 6 min 30lb Resisted gait 4 way x4 each 6 in step ups Lle x10, x5 Leg press 40lb 2x10, LLE 20lb 2x5 L knee PROM with end range holds   L tibiofemoral and patellar mobs LAQ  LLE 3lb 2x10  02/20/24 Bike L 2 x 6 min L knee PROM with end range holds   L tibiofemoral and patellar mobs Sit to stand holding yellow ball 2x10 HS curls 25lb 2x10 Leg Ext 10lb 2x10 Leg press 40lb 2x10 6in step ups 2x10each LAQ  LLE 3lb 2x10 HS curls green 2x10   PATIENT EDUCATION:  Education details: HEP/POC Person educated: Patient Education method: Programmer, multimedia, Demonstration, Tactile cues, and Verbal cues Education comprehension: verbalized understanding  HOME EXERCISE PROGRAM: Gave her the seated knee flexion and then scoot forward for increased knee flexion  ASSESSMENT:  CLINICAL IMPRESSION: Patient is doing well with L knee, but the R knee is giving her trouble. She reports that's she will be getting her R knee replaced in 5 to 6 weeks. She has met all of her goals and will return to the gym with her trainer on Wednesday. Her L knee is swollen today so she does not get full extension but if it wasn't she would be at [facility].   OBJECTIVE IMPAIRMENTS: Abnormal gait, cardiopulmonary status limiting activity, decreased activity tolerance, decreased balance, decreased endurance, decreased mobility, difficulty walking, decreased ROM,  decreased strength, increased edema, increased fascial restrictions, impaired perceived functional ability, increased muscle spasms, impaired flexibility, improper body mechanics, postural dysfunction, and pain.   REHAB POTENTIAL: Good  CLINICAL DECISION MAKING: Evolving/moderate complexity  EVALUATION COMPLEXITY: Moderate   GOALS: Goals reviewed with patient? Yes  SHORT TERM GOALS: Target date: 02/19/24 Independent with initial HEP Baseline: Goal status: MET 02/10/24  LONG TERM GOALS: Target date: 05/04/24  Independent with RICE Baseline:  Goal status: met 02/13/24  2.  Independent with advanced HEP Baseline:  Goal status: ongoing 03/12/24  3.  Decrease pain 50% Baseline:  Goal status: MET 03/04/24  4.  Increase AROM to 0-120 degrees flexion Baseline:  Goal status: ongoing 02/13/24, Progressing 02/20/24, ongoing 03/06/24, ongoing 03/12/24, Partly met 03/26/24, MET 03/30/24  5.  Walk without device Baseline:  Goal status: MET 03/04/24  6.  Return to the gym Baseline:  Goal status: Progressing 02/20/24, 03/12/24, 03/19/24 progressing went to the gym yesterday, MET 03/30/24   PLAN:  PT FREQUENCY: 2x/week  PT DURATION: 12 weeks  PLANNED INTERVENTIONS: 97164- PT Re-evaluation, 97110-Therapeutic exercises, 97530- Therapeutic activity, 97112- Neuromuscular re-education, 97535- Self Care, [id number]- Manual therapy, [phone]- Gait training, [phone]- Electrical stimulation (unattended), 97016- Vasopneumatic device, Patient/Family education, Balance training, Stair training, Taping, Joint mobilization, Scar mobilization, and Cryotherapy  PLAN FOR NEXT SESSION: work on strength, ROM and function  PHYSICAL THERAPY DISCHARGE SUMMARY  Visits from Start of Care: 16  Patient agrees to discharge. Patient goals were met. Patient is being discharged due to meeting the stated rehab goals.  [place], PT 03/30/2024, 9:25 AM

## 2024-04-02 ENCOUNTER — Ambulatory Visit: Admitting: Physical Therapy

## 2024-04-03 ENCOUNTER — Telehealth: Payer: Self-pay

## 2024-04-03 NOTE — Telephone Encounter (Signed)
 I called patient to discuss scheduling surgery.  Left voice mail message for return call.

## 2024-04-06 DIAGNOSIS — G20A1 Parkinson's disease without dyskinesia, without mention of fluctuations: Secondary | ICD-10-CM | POA: Diagnosis not present

## 2024-04-06 DIAGNOSIS — M1712 Unilateral primary osteoarthritis, left knee: Secondary | ICD-10-CM | POA: Diagnosis not present

## 2024-04-14 NOTE — Progress Notes (Unsigned)
 Assessment/Plan:   1.  Parkinsons Disease  -Continue pramipexole  0.5 mg 3 times per day.    -increase carbidopa /levodopa  25/100 to 6am/10am/2pm/6pm  -having some curling of toes but she will pay attention to timing in relationship to dosing.  Above changes may help  -we discussed focused ultrasound and DBS.  Don't think that she needs either right now.    2.  GAD  -continue lexapro , 20 mg.  She asked about potentially changing to wellbutrin but I think that this will potentially increase anxiety.  She decide to continue the lexapro   -she has stress with sister with AD, who is now in assisted living at [facility].  This has been an increasing issue and proud of her for doing the counseling  3.  Intermittent insomnia  -melatonin without relief  4.  Knee pain b/l  -had L TKA 01/17/24  - She is scheduled for right total knee arthroplasty on July 25  Subjective:   Sara Nicholson was seen today in follow up for Parkinsons disease.  My previous records were reviewed prior to todays visit as well as outside records available to me.  Patient continues on her pramipexole , levodopa  and escitalopram .  She has had no falls.  No lightheadedness or near syncope.  She had left TKA 01/17/24.  Patient is getting ready for right total knee arthroplasty on July 25.  She has been doing lots of physical therapy for her knee.  Notes are reviewed.  Having a lot of stress, associated with sister in SNF for AD.    Current prescribed movement disorder medications: Pramipexole  0.5 mg 3 times per day  Carbidopa /levodopa  25/100, 1 tablet 3 times per day  Lexapro , 20 mg daily   ALLERGIES:   Allergies  Allergen Reactions   Chlorhexidine  Rash   Tape Rash    prefers fabric band-aids    Terbinafine Hcl Hives    CURRENT MEDICATIONS:  Outpatient Encounter Medications as of 04/16/2024  Medication Sig   carbidopa -levodopa  (SINEMET  IR) 25-100 MG tablet TAKE ONE TABLET BY MOUTH THREE TIMES A DAY ~ 7 IN THE  MORNING, 11 IN THE MORNING AND 4PM   escitalopram  (LEXAPRO ) 20 MG tablet TAKE ONE TABLET BY MOUTH ONE TIME DAILY   ketotifen (ZADITOR) 0.035 % ophthalmic solution Place 1 drop into both eyes daily as needed (itching eyes).   meloxicam  (MOBIC ) 15 MG tablet Take 1 tablet (15 mg total) by mouth daily. (Patient taking differently: Take 15 mg by mouth daily as needed for pain (Knee pain).)   pramipexole  (MIRAPEX ) 0.5 MG tablet TAKE ONE TABLET BY MOUTH THREE TIMES A DAY (8:00AM, 1:00PM, AND 6:00PM)   tiZANidine  (ZANAFLEX ) 4 MG tablet Take 1 tablet (4 mg total) by mouth every 6 (six) hours as needed for muscle spasms.   [DISCONTINUED] acetaminophen  (TYLENOL ) 500 MG tablet Take 1,000 mg by mouth every 6 (six) hours as needed for moderate pain (pain score 4-6).   [DISCONTINUED] aspirin  81 MG chewable tablet Chew 1 tablet (81 mg total) by mouth 2 (two) times daily. (Patient not taking: Reported on 04/15/2024)   [DISCONTINUED] ibuprofen (ADVIL) 200 MG tablet Take 400 mg by mouth daily as needed for moderate pain (pain score 4-6).   [DISCONTINUED] oxyCODONE  (OXY IR/ROXICODONE ) 5 MG immediate release tablet Take 1-2 tablets (5-10 mg total) by mouth every 6 (six) hours as needed for moderate pain (pain score 4-6) (pain score 4-6). (Patient not taking: Reported on 04/15/2024)   No facility-administered encounter medications on file as  of 04/16/2024.    Objective:   PHYSICAL EXAMINATION:    VITALS:   Vitals:   04/16/24 0904  BP: 122/82  Pulse: 85  SpO2: 95%  Weight: 222 lb 12.8 oz (101.1 kg)    Wt Readings from Last 3 Encounters:  04/16/24 222 lb 12.8 oz (101.1 kg)  01/17/24 220 lb (99.8 kg)  01/10/24 220 lb (99.8 kg)     Wt Readings from Last 3 Encounters:  04/16/24 222 lb 12.8 oz (101.1 kg)  01/17/24 220 lb (99.8 kg)  01/10/24 220 lb (99.8 kg)     GEN:  The patient appears stated age and is in NAD. HEENT:  Normocephalic, atraumatic.  The mucous membranes are moist. The superficial temporal  arteries are without ropiness or tenderness. CV:  RRR Lungs:  CTAB Neck/HEME:  There are no carotid bruits bilaterally.  Neurological examination:  Orientation: The patient is alert and oriented x3. Cranial nerves: There is good facial symmetry without facial hypomimia. The speech is fluent and clear. Soft palate rises symmetrically and there is no tongue deviation. Hearing is intact to conversational tone. Sensation: Sensation is intact to light touch throughout Motor: Strength is at least antigravity x4.  Movement examination: Tone: There is normal tone in the UE/LE Abnormal movements: there is RUE rest tremor and LLE rest tremor Coordination:  There is no decremation with RAM's, with any form of RAMS, including alternating supination and pronation of the forearm, hand opening and closing, finger taps, heel taps and toe taps. Gait and Station: The patient is able to arise without the use of the hands.  She is a bit antalgic.    I have reviewed and interpreted the following labs independently    Chemistry      Component Value Date/Time   NA 137 01/18/2024 0331   NA 142 04/07/2019 0822   K 4.6 01/18/2024 0331   CL 107 01/18/2024 0331   CO2 25 01/18/2024 0331   BUN 15 01/18/2024 0331   BUN 12 04/07/2019 0822   CREATININE 0.61 01/18/2024 0331      Component Value Date/Time   CALCIUM 8.2 (L) 01/18/2024 0331   ALKPHOS 92 05/07/2022 0946   AST 17 05/07/2022 0946   ALT 5 05/07/2022 0946   BILITOT 0.5 05/07/2022 0946   BILITOT 0.4 04/07/2019 0822       Lab Results  Component Value Date   WBC 12.8 (H) 01/18/2024   HGB 11.1 (L) 01/18/2024   HCT 35.1 (L) 01/18/2024   MCV 97.2 01/18/2024   PLT 189 01/18/2024    Lab Results  Component Value Date   TSH 1.90 05/07/2022   Total time spent on today's visit was 40 minutes, including both face-to-face time and nonface-to-face time.  Time included that spent on review of records (prior notes available to me/labs/imaging if  pertinent), discussing treatment and goals, answering patient's questions and coordinating care.   Cc:  Claudene Lacks, MD

## 2024-04-14 NOTE — Progress Notes (Signed)
 Sent message, via epic in basket, requesting orders in epic from Careers adviser.

## 2024-04-16 ENCOUNTER — Ambulatory Visit: Payer: Medicare Other | Admitting: Neurology

## 2024-04-16 ENCOUNTER — Encounter: Payer: Self-pay | Admitting: Neurology

## 2024-04-16 VITALS — BP 122/82 | HR 85 | Wt 222.8 lb

## 2024-04-16 DIAGNOSIS — G20A1 Parkinson's disease without dyskinesia, without mention of fluctuations: Secondary | ICD-10-CM | POA: Diagnosis not present

## 2024-04-16 DIAGNOSIS — G20A2 Parkinson's disease without dyskinesia, with fluctuations: Secondary | ICD-10-CM | POA: Diagnosis not present

## 2024-04-16 MED ORDER — CARBIDOPA-LEVODOPA 25-100 MG PO TABS: ORAL_TABLET | ORAL | 1 refills | Status: DC

## 2024-04-16 NOTE — Patient Instructions (Signed)
 SURGICAL WAITING ROOM VISITATION  Patients having surgery or a procedure may have no more than 2 support people in the waiting area - these visitors may rotate.    Children under the age of 104 must have an adult with them who is not the patient.  Visitors with respiratory illnesses are discouraged from visiting and should remain at home.  If the patient needs to stay at the hospital during part of their recovery, the visitor guidelines for inpatient rooms apply. Pre-op nurse will coordinate an appropriate time for 1 support person to accompany patient in pre-op.  This support person may not rotate.    Please refer to the [facility] website for the visitor guidelines for Inpatients (after your surgery is over and you are in a regular room).       Your procedure is scheduled on: 05/01/24   Report to [facility] Main Entrance    Report to admitting at : 5;15 AM   Call this number if you have problems the morning of surgery [phone]   Do not eat food :After Midnight.   After Midnight you may have the following liquids until : 4:15 AM DAY OF SURGERY  Water Non-Citrus Juices (without pulp, NO RED-Apple, White grape, White cranberry) Black Coffee (NO MILK/CREAM OR CREAMERS, sugar ok)  Clear Tea (NO MILK/CREAM OR CREAMERS, sugar ok) regular and decaf                             Plain Jell-O (NO RED)                                           Fruit ices (not with fruit pulp, NO RED)                                     Popsicles (NO RED)                                                               Sports drinks like Gatorade (NO RED)   The day of surgery:  Drink ONE (1) Pre-Surgery Clear Ensure at : 4:15 AM the morning of surgery. Drink in one sitting. Do not sip.  This drink was given to you during your hospital  pre-op appointment visit. Nothing else to drink after completing the  Pre-Surgery Clear Ensure or G2.          If you have questions, please contact your  surgeon's office.  FOLLOW ANY ADDITIONAL PRE OP INSTRUCTIONS YOU RECEIVED FROM YOUR SURGEON'S OFFICE!!!   Oral Hygiene is also important to reduce your risk of infection.                                    Remember - BRUSH YOUR TEETH THE MORNING OF SURGERY WITH YOUR REGULAR TOOTHPASTE  DENTURES WILL BE REMOVED PRIOR TO SURGERY PLEASE DO NOT APPLY Poly grip OR ADHESIVES!!!   Do NOT smoke after Midnight   Stop all  vitamins and herbal supplements 7 days before surgery.   Take these medicines the morning of surgery with A SIP OF WATER: escitalopram ,carbidopa ,pramipexole (Mirapex ).                              You may not have any metal on your body including hair pins, jewelry, and body piercing             Do not wear make-up, lotions, powders, perfumes/cologne, or deodorant  Do not wear nail polish including gel and S&S, artificial/acrylic nails, or any other type of covering on natural nails including finger and toenails. If you have artificial nails, gel coating, etc. that needs to be removed by a nail salon please have this removed prior to surgery or surgery may need to be canceled/ delayed if the surgeon/ anesthesia feels like they are unable to be safely monitored.   Do not shave  48 hours prior to surgery.    Do not bring valuables to the hospital.  IS NOT             RESPONSIBLE   FOR VALUABLES.   Contacts, glasses, dentures or bridgework may not be worn into surgery.   Bring small overnight bag day of surgery.   DO NOT BRING YOUR HOME MEDICATIONS TO THE HOSPITAL. PHARMACY WILL DISPENSE MEDICATIONS LISTED ON YOUR MEDICATION LIST TO YOU DURING YOUR ADMISSION IN THE HOSPITAL!    Patients discharged on the day of surgery will not be allowed to drive home.  Someone NEEDS to stay with you for the first 24 hours after anesthesia.   Special Instructions: Bring a copy of your healthcare power of attorney and living will documents the day of surgery if you haven't scanned  them before.              Please read over the following fact sheets you were given: IF YOU HAVE QUESTIONS ABOUT YOUR PRE-OP INSTRUCTIONS PLEASE CALL [phone]   If you received a COVID test during your pre-op visit  it is requested that you wear a mask when out in public, stay away from anyone that may not be feeling well and notify your surgeon if you develop symptoms. If you test positive for Covid or have been in contact with anyone that has tested positive in the last 10 days please notify you surgeon.      Pre-operative 5 CHG Bath Instructions   You can play a key role in reducing the risk of infection after surgery. Your skin needs to be as free of germs as possible. You can reduce the number of germs on your skin by washing with CHG (chlorhexidine  gluconate) soap before surgery. CHG is an antiseptic soap that kills germs and continues to kill germs even after washing.   DO NOT use if you have an allergy to chlorhexidine /CHG or antibacterial soaps. If your skin becomes reddened or irritated, stop using the CHG and notify one of our RNs at [phone].   Please shower with the CHG soap starting 4 days before surgery using the following schedule:     Please keep in mind the following:  DO NOT shave, including legs and underarms, starting the day of your first shower.   You may shave your face at any point before/day of surgery.  Place clean sheets on your bed the day you start using CHG soap. Use a clean washcloth (not used since being washed) for each shower.  DO NOT sleep with pets once you start using the CHG.   CHG Shower Instructions:  If you choose to wash your hair and private area, wash first with your normal shampoo/soap.  After you use shampoo/soap, rinse your hair and body thoroughly to remove shampoo/soap residue.  Turn the water OFF and apply about 3 tablespoons (45 ml) of CHG soap to a CLEAN washcloth.  Apply CHG soap ONLY FROM YOUR NECK DOWN TO YOUR TOES (washing  for 3-5 minutes)  DO NOT use CHG soap on face, private areas, open wounds, or sores.  Pay special attention to the area where your surgery is being performed.  If you are having back surgery, having someone wash your back for you may be helpful. Wait 2 minutes after CHG soap is applied, then you may rinse off the CHG soap.  Pat dry with a clean towel  Put on clean clothes/pajamas   If you choose to wear lotion, please use ONLY the CHG-compatible lotions on the back of this paper.     Additional instructions for the day of surgery: DO NOT APPLY any lotions, deodorants, cologne, or perfumes.   Put on clean/comfortable clothes.  Brush your teeth.  Ask your nurse before applying any prescription medications to the skin.   CHG Compatible Lotions   Aveeno Moisturizing lotion  Cetaphil Moisturizing Cream  Cetaphil Moisturizing Lotion  Clairol Herbal Essence Moisturizing Lotion, Dry Skin  Clairol Herbal Essence Moisturizing Lotion, Extra Dry Skin  Clairol Herbal Essence Moisturizing Lotion, Normal Skin  Curel Age Defying Therapeutic Moisturizing Lotion with Alpha Hydroxy  Curel Extreme Care Body Lotion  Curel Soothing Hands Moisturizing Hand Lotion  Curel Therapeutic Moisturizing Cream, Fragrance-Free  Curel Therapeutic Moisturizing Lotion, Fragrance-Free  Curel Therapeutic Moisturizing Lotion, Original Formula  Eucerin Daily Replenishing Lotion  Eucerin Dry Skin Therapy Plus Alpha Hydroxy Crme  Eucerin Dry Skin Therapy Plus Alpha Hydroxy Lotion  Eucerin Original Crme  Eucerin Original Lotion  Eucerin Plus Crme Eucerin Plus Lotion  Eucerin TriLipid Replenishing Lotion  Keri Anti-Bacterial Hand Lotion  Keri Deep Conditioning Original Lotion Dry Skin Formula Softly Scented  Keri Deep Conditioning Original Lotion, Fragrance Free Sensitive Skin Formula  Keri Lotion Fast Absorbing Fragrance Free Sensitive Skin Formula  Keri Lotion Fast Absorbing Softly Scented Dry Skin Formula  Keri  Original Lotion  Keri Skin Renewal Lotion Keri Silky Smooth Lotion  Keri Silky Smooth Sensitive Skin Lotion  Nivea Body Creamy Conditioning Oil  Nivea Body Extra Enriched Lotion  Nivea Body Original Lotion  Nivea Body Sheer Moisturizing Lotion Nivea Crme  Nivea Skin Firming Lotion  NutraDerm 30 Skin Lotion  NutraDerm Skin Lotion  NutraDerm Therapeutic Skin Cream  NutraDerm Therapeutic Skin Lotion  ProShield Protective Hand Cream  Provon moisturizing lotion   Incentive Spirometer  An incentive spirometer is a tool that can help keep your lungs clear and active. This tool measures how well you are filling your lungs with each breath. Taking long deep breaths may help reverse or decrease the chance of developing breathing (pulmonary) problems (especially infection) following: A long period of time when you are unable to move or be active. BEFORE THE PROCEDURE  If the spirometer includes an indicator to show your best effort, your nurse or respiratory therapist will set it to a desired goal. If possible, sit up straight or lean slightly forward. Try not to slouch. Hold the incentive spirometer in an upright position. INSTRUCTIONS FOR USE  Sit on the edge of your bed if possible, or  sit up as far as you can in bed or on a chair. Hold the incentive spirometer in an upright position. Breathe out normally. Place the mouthpiece in your mouth and seal your lips tightly around it. Breathe in slowly and as deeply as possible, raising the piston or the ball toward the top of the column. Hold your breath for 3-5 seconds or for as long as possible. Allow the piston or ball to fall to the bottom of the column. Remove the mouthpiece from your mouth and breathe out normally. Rest for a few seconds and repeat Steps 1 through 7 at least 10 times every 1-2 hours when you are awake. Take your time and take a few normal breaths between deep breaths. The spirometer may include an indicator to show your best  effort. Use the indicator as a goal to work toward during each repetition. After each set of 10 deep breaths, practice coughing to be sure your lungs are clear. If you have an incision (the cut made at the time of surgery), support your incision when coughing by placing a pillow or rolled up towels firmly against it. Once you are able to get out of bed, walk around indoors and cough well. You may stop using the incentive spirometer when instructed by your caregiver.  RISKS AND COMPLICATIONS Take your time so you do not get dizzy or light-headed. If you are in pain, you may need to take or ask for pain medication before doing incentive spirometry. It is harder to take a deep breath if you are having pain. AFTER USE Rest and breathe slowly and easily. It can be helpful to keep track of a log of your progress. Your caregiver can provide you with a simple table to help with this. If you are using the spirometer at home, follow these instructions: SEEK MEDICAL CARE IF:  You are having difficultly using the spirometer. You have trouble using the spirometer as often as instructed. Your pain medication is not giving enough relief while using the spirometer. You develop fever of 100.5 F (38.1 C) or higher. SEEK IMMEDIATE MEDICAL CARE IF:  You cough up bloody sputum that had not been present before. You develop fever of 102 F (38.9 C) or greater. You develop worsening pain at or near the incision site. MAKE SURE YOU:  Understand these instructions. Will watch your condition. Will get help right away if you are not doing well or get worse. Document Released: 02/04/2007 Document Revised: 12/17/2011 Document Reviewed: 04/07/2007 [facility] Patient Information 2014 [place], [place].   ________________________________________________________________________

## 2024-04-16 NOTE — Patient Instructions (Addendum)
 increase carbidopa /levodopa  25/100 to 6am/10am/2pm/6pm Continue pramipexole  0.5 mg three times per day  SAVE THE DATE!  We are planning a Parkinsons Disease educational symposium at [facility], [address] [place], [place], [place] [address] on September 19.  We will have a movement disorder physician expert from [employer] coming to speak and a caregiver speaker.  We will have a panel of experts that will show you who you may need on your team of people on your journey with Parkinsons.  I hope to see you there!  Use this QR code to register by scanning it with the camera app on your phone:      Need more help with registration?  Contact [email or web address]

## 2024-04-17 NOTE — Progress Notes (Signed)
 Second request for pre op orders: Left voicemail for [employer].

## 2024-04-21 ENCOUNTER — Encounter (HOSPITAL_COMMUNITY): Payer: Self-pay

## 2024-04-21 ENCOUNTER — Other Ambulatory Visit: Payer: Self-pay

## 2024-04-21 ENCOUNTER — Encounter (HOSPITAL_COMMUNITY)
Admission: RE | Admit: 2024-04-21 | Discharge: 2024-04-21 | Disposition: A | Discharge status: Home / Self Care | Source: Ambulatory Visit | Attending: Orthopaedic Surgery | Admitting: Orthopaedic Surgery

## 2024-04-21 VITALS — BP 157/79 | HR 74 | Temp 98.1°F | Ht 64.0 in | Wt 221.0 lb

## 2024-04-21 DIAGNOSIS — Z01812 Encounter for preprocedural laboratory examination: Secondary | ICD-10-CM | POA: Diagnosis not present

## 2024-04-21 DIAGNOSIS — Z01818 Encounter for other preprocedural examination: Secondary | ICD-10-CM

## 2024-04-21 DIAGNOSIS — I10 Essential (primary) hypertension: Secondary | ICD-10-CM

## 2024-04-21 DIAGNOSIS — M1711 Unilateral primary osteoarthritis, right knee: Secondary | ICD-10-CM | POA: Diagnosis not present

## 2024-04-21 LAB — BASIC METABOLIC PANEL WITH GFR
Anion gap: 11 (ref 5–15)
BUN: 21 mg/dL (ref 8–23)
CO2: 26 mmol/L (ref 22–32)
Calcium: 9.8 mg/dL (ref 8.9–10.3)
Chloride: 103 mmol/L (ref 98–111)
Creatinine, Ser: 0.79 mg/dL (ref 0.44–1.00)
GFR, Estimated: >60 mL/min (ref >60)
Glucose, Bld: 103 mg/dL — ABNORMAL HIGH (ref 70–99)
Potassium: 4.4 mmol/L (ref 3.5–5.1)
Sodium: 140 mmol/L (ref 135–145)

## 2024-04-21 LAB — CBC
HCT: 41.8 % (ref 36.0–46.0)
Hemoglobin: 13.2 g/dL (ref 12.0–15.0)
MCH: 29.5 pg (ref 26.0–34.0)
MCHC: 31.6 g/dL (ref 30.0–36.0)
MCV: 93.5 fL (ref 80.0–100.0)
Platelets: 222 K/uL (ref 150–400)
RBC: 4.47 MIL/uL (ref 3.87–5.11)
RDW: 14.1 % (ref 11.5–15.5)
WBC: 7.4 K/uL (ref 4.0–10.5)
nRBC: 0 % (ref 0.0–0.2)

## 2024-04-21 LAB — SURGICAL PCR SCREEN
MRSA, PCR: NEGATIVE
Staphylococcus aureus: NEGATIVE

## 2024-04-21 NOTE — Progress Notes (Signed)
 For Anesthesia: PCP - Katina Pfeiffer, PA-C  Cardiologist - N/A Neurology- Asberry Tat, DO  Bowel Prep reminder:  Chest x-ray -  EKG - 01/10/24 Stress Test -  ECHO -  Cardiac Cath -  Pacemaker/ICD device last checked: Pacemaker orders received: Device Rep notified:  Spinal Cord Stimulator:N/A  Sleep Study - N/A CPAP -   Fasting Blood Sugar - N/A Checks Blood Sugar _____ times a day Date and result of last Hgb A1c-  Last dose of GLP1 agonist- N/A GLP1 instructions:   Last dose of SGLT-2 inhibitors- N/A SGLT-2 instructions:   Blood Thinner Instructions:N/A Aspirin  Instructions: Last Dose:  Activity level:    Unable to go up a flight of stairs without due to knee pain. Anesthesia review:   Patient denies shortness of breath, fever, cough and chest pain at PAT appointment   Patient verbalized understanding of instructions that were reviewed over the telephone.

## 2024-04-27 ENCOUNTER — Telehealth: Payer: Self-pay | Admitting: *Deleted

## 2024-04-27 NOTE — Care Plan (Signed)
 [facility] RNCM call to patient to discuss her upcoming Right total knee arthroplasty with Dr. Vernetta on 05/01/24 at [facility]. She is agreeable to case management. She lives with her husband who will be assisting her at home after discharge. She has a RW, ice machine, elevated toilet seat, knee immobilizer. No DME needed. Anticipate HHPT will be needed after a short hospital stay. Referral made to [facility] after choice provided. Reviewed post op care instructions. Will continue to follow for needs.

## 2024-04-27 NOTE — Telephone Encounter (Signed)
 [facility] RNCM pre-op  call completed.

## 2024-04-30 NOTE — Anesthesia Preprocedure Evaluation (Signed)
 Anesthesia Evaluation  Patient identified by MRN, date of birth, ID band Patient awake    Reviewed: Allergy & Precautions, NPO status , Patient's Chart, lab work & pertinent test results  Airway Mallampati: II  TM Distance: >3 FB Neck ROM: Full    Dental  (+) Teeth Intact, Dental Advisory Given   Pulmonary neg pulmonary ROS   Pulmonary exam normal breath sounds clear to auscultation       Cardiovascular hypertension, Normal cardiovascular exam Rhythm:Regular Rate:Normal     Neuro/Psych  PSYCHIATRIC DISORDERS  Depression    Parkinson's disease     GI/Hepatic negative GI ROS, Neg liver ROS,,,  Endo/Other  Obesity   Renal/GU negative Renal ROS     Musculoskeletal  (+) Arthritis ,    Abdominal   Peds  Hematology negative hematology ROS (+) Plt 222k   Anesthesia Other Findings   Reproductive/Obstetrics                              Anesthesia Physical Anesthesia Plan  ASA: 2  Anesthesia Plan: Spinal   Post-op Pain Management: Tylenol  PO (pre-op)*, Toradol IV (intra-op)* and Regional block*   Induction: Intravenous  PONV Risk Score and Plan: 2 and Midazolam , TIVA, Dexamethasone  and Ondansetron   Airway Management Planned: Simple Face Mask and Natural Airway  Additional Equipment:   Intra-op Plan:   Post-operative Plan:   Informed Consent: I have reviewed the patients History and Physical, chart, labs and discussed the procedure including the risks, benefits and alternatives for the proposed anesthesia with the patient or authorized representative who has indicated his/her understanding and acceptance.     Dental advisory given  Plan Discussed with: CRNA  Anesthesia Plan Comments:          Anesthesia Quick Evaluation

## 2024-04-30 NOTE — H&P (Signed)
 TOTAL KNEE ADMISSION H&P  Patient is being admitted for right total knee arthroplasty.  Subjective:  Chief Complaint:right knee pain.  HPI: Sara Nicholson, 68 y.o. female, has a history of pain and functional disability in the right knee due to arthritis and has failed non-surgical conservative treatments for greater than 12 weeks to includeNSAID's and/or analgesics, corticosteriod injections, viscosupplementation injections, flexibility and strengthening excercises, supervised PT with diminished ADL's post treatment, use of assistive devices, weight reduction as appropriate, and activity modification.  Onset of symptoms was gradual, starting a few years ago with gradually worsening course since that time. The patient noted no past surgery on the right knee(s).  Patient currently rates pain in the right knee(s) at 10 out of 10 with activity. Patient has night pain, worsening of pain with activity and weight bearing, pain that interferes with activities of daily living, pain with passive range of motion, crepitus, and joint swelling.  Patient has evidence of subchondral sclerosis, periarticular osteophytes, and joint space narrowing by imaging studies. There is no active infection.  Patient Active Problem List   Diagnosis Date Noted   Status post total left knee replacement 01/17/2024   Unilateral primary osteoarthritis, right knee 01/15/2023   Hyperlipidemia 11/08/2022   Class 2 severe obesity due to excess calories with serious comorbidity and body mass index (BMI) of 37.0 to 37.9 in adult [facility]) 11/08/2022   Parkinson's disease (HCC) 01/16/2021   Past Medical History:  Diagnosis Date   Arthritis    Cancer (HCC)    squamous lesion right shoulder   Depression    Hypertension    Neuromuscular disorder (HCC)    Parkinson disease (HCC)    Pneumonia 1988    Past Surgical History:  Procedure Laterality Date   APPENDECTOMY     BREAST EXCISIONAL BIOPSY Left    benign cyst   CESAREAN SECTION      x1   COLONOSCOPY     TOTAL KNEE ARTHROPLASTY Left 01/17/2024   Procedure: ARTHROPLASTY, KNEE, TOTAL;  Surgeon: Vernetta Lonni GRADE, MD;  Location: [facility];  Service: Orthopedics;  Laterality: Left;    No current facility-administered medications for this encounter.   Current Outpatient Medications  Medication Sig Dispense Refill Last Dose/Taking   escitalopram  (LEXAPRO ) 20 MG tablet TAKE ONE TABLET BY MOUTH ONE TIME DAILY 90 tablet 0 Taking   ketotifen (ZADITOR) 0.035 % ophthalmic solution Place 1 drop into both eyes daily as needed (itching eyes).   Taking As Needed   meloxicam  (MOBIC ) 15 MG tablet Take 1 tablet (15 mg total) by mouth daily. (Patient taking differently: Take 15 mg by mouth daily as needed for pain (Knee pain).) 30 tablet 0 Taking Differently   pramipexole  (MIRAPEX ) 0.5 MG tablet TAKE ONE TABLET BY MOUTH THREE TIMES A DAY (8:00AM, 1:00PM, AND 6:00PM) 270 tablet 0 Taking   tiZANidine  (ZANAFLEX ) 4 MG tablet Take 1 tablet (4 mg total) by mouth every 6 (six) hours as needed for muscle spasms. 30 tablet 0 Taking As Needed   carbidopa -levodopa  (SINEMET  IR) 25-100 MG tablet 6am/10am/2pm/6pm 360 tablet 1    Allergies  Allergen Reactions   Chlorhexidine  Rash   Tape Rash    prefers fabric band-aids    Terbinafine Hcl Hives    Social History   Tobacco Use   Smoking status: Never   Smokeless tobacco: Never  Substance Use Topics   Alcohol use: No    Family History  Problem Relation Age of Onset   Heart attack Father  Heart attack Brother    Cancer Brother        Mass on L5   Diabetes Maternal Grandmother    Heart attack Maternal Grandfather    Heart attack Paternal Grandfather    Cancer Brother    Lung cancer Brother    Cancer - Lung Brother        mets to esophagus and brain   Neurologic Disorder Sister 34       mild cognitive disorder   Neuromuscular disorder Mother 54       unknown diagnosis   Colon cancer Neg Hx    Colon polyps Neg Hx    Stomach  cancer Neg Hx    Rectal cancer Neg Hx      Review of Systems  Objective:  Physical Exam Vitals reviewed.  Constitutional:      Appearance: Normal appearance.  HENT:     Head: Normocephalic and atraumatic.  Eyes:     Extraocular Movements: Extraocular movements intact.     Pupils: Pupils are equal, round, and reactive to light.  Cardiovascular:     Rate and Rhythm: Normal rate and regular rhythm.  Pulmonary:     Effort: Pulmonary effort is normal.     Breath sounds: Normal breath sounds.  Abdominal:     Palpations: Abdomen is soft.  Musculoskeletal:     Cervical back: Normal range of motion and neck supple.     Right knee: Effusion, bony tenderness and crepitus present. Decreased range of motion. Tenderness present over the medial joint line. Abnormal meniscus.  Neurological:     Mental Status: She is alert and oriented to person, place, and time. Mental status is at baseline.  Psychiatric:        Behavior: Behavior normal.     Vital signs in last 24 hours:    Labs:   Estimated body mass index is 37.93 kg/m as calculated from the following:   Height as of 04/21/24: 5' 4 (1.626 m).   Weight as of 04/21/24: 100.2 kg.   Imaging Review MRI demonstrates severe degenerative joint disease of the right knee(s). The overall alignment isneutral. The bone quality appears to be good for age and reported activity level.      Assessment/Plan:  End stage arthritis, right knee   The patient history, physical examination, clinical judgment of the provider and imaging studies are consistent with end stage degenerative joint disease of the right knee(s) and total knee arthroplasty is deemed medically necessary. The treatment options including medical management, injection therapy arthroscopy and arthroplasty were discussed at length. The risks and benefits of total knee arthroplasty were presented and reviewed. The risks due to aseptic loosening, infection, stiffness, patella  tracking problems, thromboembolic complications and other imponderables were discussed. The patient acknowledged the explanation, agreed to proceed with the plan and consent was signed. Patient is being admitted for inpatient treatment for surgery, pain control, PT, OT, prophylactic antibiotics, VTE prophylaxis, progressive ambulation and ADL's and discharge planning. The patient is planning to be discharged home with home health services

## 2024-05-01 ENCOUNTER — Ambulatory Visit (HOSPITAL_COMMUNITY): Payer: Self-pay | Admitting: Medical

## 2024-05-01 ENCOUNTER — Other Ambulatory Visit: Payer: Self-pay | Admitting: *Deleted

## 2024-05-01 ENCOUNTER — Observation Stay (HOSPITAL_COMMUNITY)

## 2024-05-01 ENCOUNTER — Other Ambulatory Visit: Payer: Self-pay

## 2024-05-01 ENCOUNTER — Ambulatory Visit (HOSPITAL_BASED_OUTPATIENT_CLINIC_OR_DEPARTMENT_OTHER): Admitting: Anesthesiology

## 2024-05-01 ENCOUNTER — Encounter (HOSPITAL_COMMUNITY): Payer: Self-pay | Admitting: Orthopaedic Surgery

## 2024-05-01 ENCOUNTER — Encounter (HOSPITAL_COMMUNITY)
Admission: RE | Disposition: A | Discharge status: Home Health | Payer: Self-pay | Source: Home / Self Care | Attending: Orthopaedic Surgery

## 2024-05-01 ENCOUNTER — Observation Stay (HOSPITAL_COMMUNITY)
Admission: RE | Admit: 2024-05-01 | Discharge: 2024-05-02 | Disposition: A | Discharge status: Home Health | Attending: Orthopaedic Surgery | Admitting: Orthopaedic Surgery

## 2024-05-01 DIAGNOSIS — Z96651 Presence of right artificial knee joint: Secondary | ICD-10-CM

## 2024-05-01 DIAGNOSIS — M1711 Unilateral primary osteoarthritis, right knee: Secondary | ICD-10-CM

## 2024-05-01 DIAGNOSIS — G20C Parkinsonism, unspecified: Secondary | ICD-10-CM | POA: Insufficient documentation

## 2024-05-01 DIAGNOSIS — Z79899 Other long term (current) drug therapy: Secondary | ICD-10-CM | POA: Diagnosis not present

## 2024-05-01 DIAGNOSIS — Z471 Aftercare following joint replacement surgery: Secondary | ICD-10-CM | POA: Diagnosis not present

## 2024-05-01 DIAGNOSIS — G8918 Other acute postprocedural pain: Secondary | ICD-10-CM | POA: Diagnosis not present

## 2024-05-01 DIAGNOSIS — I10 Essential (primary) hypertension: Secondary | ICD-10-CM | POA: Insufficient documentation

## 2024-05-01 DIAGNOSIS — Z96652 Presence of left artificial knee joint: Secondary | ICD-10-CM | POA: Insufficient documentation

## 2024-05-01 DIAGNOSIS — R609 Edema, unspecified: Secondary | ICD-10-CM | POA: Diagnosis not present

## 2024-05-01 DIAGNOSIS — Z85828 Personal history of other malignant neoplasm of skin: Secondary | ICD-10-CM | POA: Diagnosis not present

## 2024-05-01 HISTORY — PX: TOTAL KNEE ARTHROPLASTY: SHX125

## 2024-05-01 LAB — CBC
HCT: 38 % (ref 36.0–46.0)
Hemoglobin: 12 g/dL (ref 12.0–15.0)
MCH: 29.3 pg (ref 26.0–34.0)
MCHC: 31.6 g/dL (ref 30.0–36.0)
MCV: 92.9 fL (ref 80.0–100.0)
Platelets: 217 K/uL (ref 150–400)
RBC: 4.09 MIL/uL (ref 3.87–5.11)
RDW: 14.1 % (ref 11.5–15.5)
WBC: 7.7 K/uL (ref 4.0–10.5)
nRBC: 0 % (ref 0.0–0.2)

## 2024-05-01 LAB — BASIC METABOLIC PANEL WITH GFR
Anion gap: 7 (ref 5–15)
BUN: 11 mg/dL (ref 8–23)
CO2: 25 mmol/L (ref 22–32)
Calcium: 9.2 mg/dL (ref 8.9–10.3)
Chloride: 108 mmol/L (ref 98–111)
Creatinine, Ser: 0.57 mg/dL (ref 0.44–1.00)
GFR, Estimated: >60 mL/min (ref >60)
Glucose, Bld: 136 mg/dL — ABNORMAL HIGH (ref 70–99)
Potassium: 4.8 mmol/L (ref 3.5–5.1)
Sodium: 140 mmol/L (ref 135–145)

## 2024-05-01 SURGERY — ARTHROPLASTY, KNEE, TOTAL
Anesthesia: Spinal | Site: Knee | Laterality: Right

## 2024-05-01 MED ORDER — OXYCODONE HCL 5 MG PO TABS
10.0000 mg | ORAL_TABLET | ORAL | Status: DC | PRN
  Administered 2024-05-01 – 2024-05-02 (×3): 15 mg via ORAL
  Filled 2024-05-01 (×3): qty 3

## 2024-05-01 MED ORDER — ONDANSETRON HCL 4 MG PO TABS: 4.0000 mg | ORAL_TABLET | Freq: Four times a day (QID) | ORAL | Status: DC | PRN

## 2024-05-01 MED ORDER — CEFAZOLIN SODIUM-DEXTROSE 2-4 GM/100ML-% IV SOLN
2.0000 g | INTRAVENOUS | Status: AC
  Administered 2024-05-01: 2 g via INTRAVENOUS
  Filled 2024-05-01: qty 100

## 2024-05-01 MED ORDER — ACETAMINOPHEN 325 MG PO TABS: 325.0000 mg | ORAL_TABLET | Freq: Four times a day (QID) | ORAL | Status: DC | PRN

## 2024-05-01 MED ORDER — SENNA 8.6 MG PO TABS
1.0000 | ORAL_TABLET | Freq: Two times a day (BID) | ORAL | Status: DC
  Administered 2024-05-01 – 2024-05-02 (×3): 8.6 mg via ORAL
  Filled 2024-05-01 (×3): qty 1

## 2024-05-01 MED ORDER — PHENYLEPHRINE HCL-NACL 20-0.9 MG/250ML-% IV SOLN
INTRAVENOUS | Status: AC
  Filled 2024-05-01: qty 250

## 2024-05-01 MED ORDER — PANTOPRAZOLE SODIUM 40 MG PO TBEC
40.0000 mg | DELAYED_RELEASE_TABLET | Freq: Every day | ORAL | Status: DC
  Administered 2024-05-01 – 2024-05-02 (×2): 40 mg via ORAL
  Filled 2024-05-01 (×2): qty 1

## 2024-05-01 MED ORDER — PRAMIPEXOLE DIHYDROCHLORIDE 0.25 MG PO TABS: 0.5000 mg | ORAL_TABLET | Freq: Three times a day (TID) | ORAL | Status: DC

## 2024-05-01 MED ORDER — SODIUM CHLORIDE 0.9 % IV SOLN
INTRAVENOUS | Status: AC
  Administered 2024-05-02: 75 mL/h via INTRAVENOUS

## 2024-05-01 MED ORDER — SODIUM CHLORIDE 0.9 % IR SOLN
Status: DC | PRN
  Administered 2024-05-01: 1000 mL

## 2024-05-01 MED ORDER — TRANEXAMIC ACID-NACL 1000-0.7 MG/100ML-% IV SOLN
1000.0000 mg | INTRAVENOUS | Status: AC
  Administered 2024-05-01: 1000 mg via INTRAVENOUS
  Filled 2024-05-01: qty 100

## 2024-05-01 MED ORDER — 0.9 % SODIUM CHLORIDE (POUR BTL) OPTIME
TOPICAL | Status: DC | PRN
  Administered 2024-05-01: 1000 mL

## 2024-05-01 MED ORDER — CHLORHEXIDINE GLUCONATE 0.12 % MT SOLN: 15.0000 mL | Freq: Once | OROMUCOSAL | Status: AC

## 2024-05-01 MED ORDER — PRAMIPEXOLE DIHYDROCHLORIDE 0.25 MG PO TABS
0.5000 mg | ORAL_TABLET | Freq: Three times a day (TID) | ORAL | Status: DC
  Administered 2024-05-02 (×3): 0.5 mg via ORAL
  Filled 2024-05-01 (×3): qty 2

## 2024-05-01 MED ORDER — BUPIVACAINE-EPINEPHRINE (PF) 0.25% -1:200000 IJ SOLN
INTRAMUSCULAR | Status: AC
Start: 2024-05-01 — End: 2024-05-01
  Filled 2024-05-01: qty 30

## 2024-05-01 MED ORDER — MIDAZOLAM HCL 2 MG/2ML IJ SOLN
INTRAMUSCULAR | Status: AC
  Filled 2024-05-01: qty 2

## 2024-05-01 MED ORDER — ESCITALOPRAM OXALATE 20 MG PO TABS
20.0000 mg | ORAL_TABLET | Freq: Every day | ORAL | Status: DC
  Administered 2024-05-02: 20 mg via ORAL
  Filled 2024-05-01: qty 1

## 2024-05-01 MED ORDER — HYDROMORPHONE HCL 1 MG/ML IJ SOLN
0.5000 mg | INTRAMUSCULAR | Status: DC | PRN
  Administered 2024-05-02: 1 mg via INTRAVENOUS
  Filled 2024-05-01: qty 1

## 2024-05-01 MED ORDER — PRAMIPEXOLE DIHYDROCHLORIDE 0.25 MG PO TABS
0.5000 mg | ORAL_TABLET | Freq: Three times a day (TID) | ORAL | Status: DC
  Administered 2024-05-01: 0.5 mg via ORAL
  Filled 2024-05-01: qty 2

## 2024-05-01 MED ORDER — BUPIVACAINE IN DEXTROSE 0.75-8.25 % IT SOLN
INTRATHECAL | Status: DC | PRN
Start: 2024-05-01 — End: 2024-05-01
  Administered 2024-05-01: 1.6 mL via INTRATHECAL

## 2024-05-01 MED ORDER — ASPIRIN 81 MG PO CHEW
81.0000 mg | CHEWABLE_TABLET | Freq: Two times a day (BID) | ORAL | Status: DC
  Administered 2024-05-02 (×2): 81 mg via ORAL
  Filled 2024-05-01 (×2): qty 1

## 2024-05-01 MED ORDER — METOCLOPRAMIDE HCL 5 MG/ML IJ SOLN: 5.0000 mg | Freq: Three times a day (TID) | INTRAMUSCULAR | Status: DC | PRN

## 2024-05-01 MED ORDER — CLONIDINE HCL (ANALGESIA) 100 MCG/ML EP SOLN
EPIDURAL | Status: DC | PRN
Start: 2024-05-01 — End: 2024-05-01
  Administered 2024-05-01: 50 ug

## 2024-05-01 MED ORDER — EPHEDRINE SULFATE (PRESSORS) 50 MG/ML IJ SOLN
INTRAMUSCULAR | Status: DC | PRN
  Administered 2024-05-01: 5 mg via INTRAVENOUS

## 2024-05-01 MED ORDER — EPHEDRINE 5 MG/ML INJ
INTRAVENOUS | Status: AC
  Filled 2024-05-01: qty 5

## 2024-05-01 MED ORDER — CEFAZOLIN SODIUM-DEXTROSE 2-4 GM/100ML-% IV SOLN
2.0000 g | Freq: Four times a day (QID) | INTRAVENOUS | Status: AC
  Administered 2024-05-01 (×2): 2 g via INTRAVENOUS
  Filled 2024-05-01 (×2): qty 100

## 2024-05-01 MED ORDER — ACETAMINOPHEN 500 MG PO TABS
1000.0000 mg | ORAL_TABLET | Freq: Once | ORAL | Status: AC
  Administered 2024-05-01: 1000 mg via ORAL
  Filled 2024-05-01: qty 2

## 2024-05-01 MED ORDER — CARBIDOPA-LEVODOPA 25-100 MG PO TABS
1.0000 | ORAL_TABLET | Freq: Four times a day (QID) | ORAL | Status: DC
  Administered 2024-05-01: 1 via ORAL
  Filled 2024-05-01: qty 1

## 2024-05-01 MED ORDER — POVIDONE-IODINE 10 % EX SWAB: 2.0000 | Freq: Once | CUTANEOUS | Status: DC

## 2024-05-01 MED ORDER — ONDANSETRON HCL 4 MG/2ML IJ SOLN
INTRAMUSCULAR | Status: AC
  Filled 2024-05-01: qty 2

## 2024-05-01 MED ORDER — CARBIDOPA-LEVODOPA 25-100 MG PO TABS
1.0000 | ORAL_TABLET | Freq: Four times a day (QID) | ORAL | Status: AC
  Administered 2024-05-01 (×2): 1 via ORAL
  Filled 2024-05-01 (×2): qty 1

## 2024-05-01 MED ORDER — LIDOCAINE HCL (CARDIAC) PF 100 MG/5ML IV SOSY
PREFILLED_SYRINGE | INTRAVENOUS | Status: DC | PRN
  Administered 2024-05-01: 40 mg via INTRAVENOUS

## 2024-05-01 MED ORDER — BUPIVACAINE-EPINEPHRINE 0.25% -1:200000 IJ SOLN
INTRAMUSCULAR | Status: DC | PRN
  Administered 2024-05-01: 30 mL

## 2024-05-01 MED ORDER — ONDANSETRON HCL 4 MG/2ML IJ SOLN
INTRAMUSCULAR | Status: DC | PRN
  Administered 2024-05-01: 4 mg via INTRAVENOUS

## 2024-05-01 MED ORDER — METOCLOPRAMIDE HCL 5 MG PO TABS: 5.0000 mg | ORAL_TABLET | Freq: Three times a day (TID) | ORAL | Status: DC | PRN

## 2024-05-01 MED ORDER — LACTATED RINGERS IV SOLN: INTRAVENOUS | Status: DC

## 2024-05-01 MED ORDER — LIDOCAINE HCL (PF) 2 % IJ SOLN
INTRAMUSCULAR | Status: AC
  Filled 2024-05-01: qty 5

## 2024-05-01 MED ORDER — PRAMIPEXOLE DIHYDROCHLORIDE 0.25 MG PO TABS
0.5000 mg | ORAL_TABLET | Freq: Three times a day (TID) | ORAL | Status: AC
  Administered 2024-05-01 (×2): 0.5 mg via ORAL
  Filled 2024-05-01 (×2): qty 2

## 2024-05-01 MED ORDER — MIDAZOLAM HCL 5 MG/5ML IJ SOLN
INTRAMUSCULAR | Status: DC | PRN
  Administered 2024-05-01 (×2): .5 mg via INTRAVENOUS
  Administered 2024-05-01: 1 mg via INTRAVENOUS

## 2024-05-01 MED ORDER — PROPOFOL 500 MG/50ML IV EMUL
INTRAVENOUS | Status: DC | PRN
  Administered 2024-05-01: 50 ug/kg/min via INTRAVENOUS

## 2024-05-01 MED ORDER — FENTANYL CITRATE (PF) 100 MCG/2ML IJ SOLN
INTRAMUSCULAR | Status: AC
  Filled 2024-05-01: qty 2

## 2024-05-01 MED ORDER — DIPHENHYDRAMINE HCL 12.5 MG/5ML PO ELIX: 12.5000 mg | ORAL_SOLUTION | ORAL | Status: DC | PRN

## 2024-05-01 MED ORDER — DEXAMETHASONE SODIUM PHOSPHATE 10 MG/ML IJ SOLN
INTRAMUSCULAR | Status: DC | PRN
Start: 2024-05-01 — End: 2024-05-01
  Administered 2024-05-01: 8 mg via INTRAVENOUS

## 2024-05-01 MED ORDER — KETOTIFEN FUMARATE 0.035 % OP SOLN: 1.0000 [drp] | Freq: Every day | OPHTHALMIC | Status: DC | PRN

## 2024-05-01 MED ORDER — ALUM & MAG HYDROXIDE-SIMETH 200-200-20 MG/5ML PO SUSP
30.0000 mL | ORAL | Status: DC | PRN
Start: 2024-05-01 — End: 2024-05-02

## 2024-05-01 MED ORDER — PHENYLEPHRINE HCL-NACL 20-0.9 MG/250ML-% IV SOLN
INTRAVENOUS | Status: DC | PRN
  Administered 2024-05-01: 30 ug/min via INTRAVENOUS

## 2024-05-01 MED ORDER — TIZANIDINE HCL 4 MG PO TABS
4.0000 mg | ORAL_TABLET | Freq: Four times a day (QID) | ORAL | Status: DC | PRN
  Administered 2024-05-01 – 2024-05-02 (×4): 4 mg via ORAL
  Filled 2024-05-01 (×4): qty 1

## 2024-05-01 MED ORDER — PROPOFOL 1000 MG/100ML IV EMUL
INTRAVENOUS | Status: AC
Start: 2024-05-01 — End: 2024-05-01
  Filled 2024-05-01: qty 100

## 2024-05-01 MED ORDER — FENTANYL CITRATE (PF) 100 MCG/2ML IJ SOLN
INTRAMUSCULAR | Status: DC | PRN
  Administered 2024-05-01: 25 ug via INTRAVENOUS
  Administered 2024-05-01: 50 ug via INTRAVENOUS
  Administered 2024-05-01: 25 ug via INTRAVENOUS

## 2024-05-01 MED ORDER — DEXAMETHASONE SODIUM PHOSPHATE 10 MG/ML IJ SOLN
INTRAMUSCULAR | Status: AC
Start: 2024-05-01 — End: 2024-05-01
  Filled 2024-05-01: qty 1

## 2024-05-01 MED ORDER — CARBIDOPA-LEVODOPA 25-100 MG PO TABS
1.0000 | ORAL_TABLET | ORAL | Status: DC
  Administered 2024-05-02 (×3): 1 via ORAL
  Filled 2024-05-01 (×3): qty 1

## 2024-05-01 MED ORDER — OXYCODONE HCL 5 MG PO TABS
5.0000 mg | ORAL_TABLET | ORAL | Status: DC | PRN
  Administered 2024-05-01: 5 mg via ORAL
  Administered 2024-05-02: 10 mg via ORAL
  Filled 2024-05-01: qty 2
  Filled 2024-05-01: qty 1

## 2024-05-01 MED ORDER — ONDANSETRON HCL 4 MG/2ML IJ SOLN: 4.0000 mg | Freq: Four times a day (QID) | INTRAMUSCULAR | Status: DC | PRN

## 2024-05-01 MED ORDER — ROPIVACAINE HCL 5 MG/ML IJ SOLN
INTRAMUSCULAR | Status: DC | PRN
Start: 2024-05-01 — End: 2024-05-01
  Administered 2024-05-01: 20 mL via PERINEURAL

## 2024-05-01 MED ORDER — ORAL CARE MOUTH RINSE
15.0000 mL | Freq: Once | OROMUCOSAL | Status: AC
  Administered 2024-05-01: 15 mL via OROMUCOSAL

## 2024-05-01 SURGICAL SUPPLY — 53 items
BAG COUNTER SPONGE SURGICOUNT (BAG) IMPLANT
BAG ZIPLOCK 12X15 (MISCELLANEOUS) ×2 IMPLANT
BENZOIN TINCTURE PRP APPL 2/3 (GAUZE/BANDAGES/DRESSINGS) IMPLANT
BLADE SAG 18X100X1.27 (BLADE) ×2 IMPLANT
BLADE SAW SGTL 11.0X1.19X90.0M (BLADE) IMPLANT
BNDG ELASTIC 6INX 5YD STR LF (GAUZE/BANDAGES/DRESSINGS) ×4 IMPLANT
BOWL SMART MIX CTS (DISPOSABLE) IMPLANT
CEMENT BONE R 1X40 (Cement) IMPLANT
COMPONENT FEM CR CEMT STD SZ5 (Joint) IMPLANT
COMPONENT TIB CMT PS D 0D RT (Joint) IMPLANT
COOLER ICEMAN CLASSIC (MISCELLANEOUS) ×2 IMPLANT
COVER SURGICAL LIGHT HANDLE (MISCELLANEOUS) ×2 IMPLANT
CUFF TRNQT CYL 34X4.125X (TOURNIQUET CUFF) ×2 IMPLANT
DRAPE U-SHAPE 47X51 STRL (DRAPES) ×2 IMPLANT
DURAPREP 26ML APPLICATOR (WOUND CARE) ×2 IMPLANT
ELECT BLADE TIP CTD 4 INCH (ELECTRODE) ×2 IMPLANT
ELECT PENCIL ROCKER SW 15FT (MISCELLANEOUS) ×2 IMPLANT
ELECT REM PT RETURN 15FT ADLT (MISCELLANEOUS) ×2 IMPLANT
GAUZE PAD ABD 8X10 STRL (GAUZE/BANDAGES/DRESSINGS) ×4 IMPLANT
GAUZE SPONGE 4X4 12PLY STRL (GAUZE/BANDAGES/DRESSINGS) ×2 IMPLANT
GAUZE XEROFORM 1X8 LF (GAUZE/BANDAGES/DRESSINGS) IMPLANT
GLOVE BIO SURGEON STRL SZ7.5 (GLOVE) ×2 IMPLANT
GLOVE BIOGEL PI IND STRL 8 (GLOVE) ×4 IMPLANT
GLOVE ECLIPSE 8.0 STRL XLNG CF (GLOVE) ×2 IMPLANT
GOWN STRL REUS W/ TWL XL LVL3 (GOWN DISPOSABLE) ×4 IMPLANT
GUIDE TRIAL TIB UNI KNEE RU (ORTHOPEDIC DISPOSABLE SUPPLIES) IMPLANT
HOLDER FOLEY CATH W/STRAP (MISCELLANEOUS) IMPLANT
IMMOBILIZER KNEE 20 THIGH 36 (SOFTGOODS) ×2 IMPLANT
KIT TURNOVER KIT A (KITS) ×2 IMPLANT
LINER ASF PERS CD/1-2 16 RT (Liner) IMPLANT
MANIFOLD NEPTUNE II (INSTRUMENTS) ×2 IMPLANT
NS IRRIG 1000ML POUR BTL (IV SOLUTION) ×2 IMPLANT
PACK TOTAL KNEE CUSTOM (KITS) ×2 IMPLANT
PAD COLD SHLDR WRAP-ON (PAD) ×2 IMPLANT
PADDING CAST COTTON 6X4 STRL (CAST SUPPLIES) ×4 IMPLANT
PIN DRILL HDLS TROCAR 75 4PK (PIN) IMPLANT
PROTECTOR NERVE ULNAR (MISCELLANEOUS) ×2 IMPLANT
SCREW FEMALE HEX FIX 25X2.5 (ORTHOPEDIC DISPOSABLE SUPPLIES) IMPLANT
SET HNDPC FAN SPRY TIP SCT (DISPOSABLE) ×2 IMPLANT
SET PAD KNEE POSITIONER (MISCELLANEOUS) ×2 IMPLANT
SPIKE FLUID TRANSFER (MISCELLANEOUS) IMPLANT
STAPLER SKIN PROX 35W (STAPLE) IMPLANT
STEM POLY PAT PLY 29M KNEE (Knees) IMPLANT
STRIP CLOSURE SKIN 1/2X4 (GAUZE/BANDAGES/DRESSINGS) IMPLANT
SUT MNCRL AB 4-0 PS2 18 (SUTURE) IMPLANT
SUT VIC AB 0 CT1 36 (SUTURE) ×2 IMPLANT
SUT VIC AB 1 CT1 36 (SUTURE) ×4 IMPLANT
SUT VIC AB 2-0 CT1 TAPERPNT 27 (SUTURE) ×4 IMPLANT
TOWEL GREEN STERILE FF (TOWEL DISPOSABLE) ×2 IMPLANT
TRAY FOLEY MTR SLVR 14FR STAT (SET/KITS/TRAYS/PACK) IMPLANT
TRAY FOLEY MTR SLVR 16FR STAT (SET/KITS/TRAYS/PACK) IMPLANT
WATER STERILE IRR 1000ML POUR (IV SOLUTION) ×4 IMPLANT
YANKAUER SUCT BULB TIP NO VENT (SUCTIONS) ×2 IMPLANT

## 2024-05-01 NOTE — Transfer of Care (Signed)
 Immediate Anesthesia Transfer of Care Note  Patient: Sara Nicholson  Procedure(s) Performed: ARTHROPLASTY, KNEE, TOTAL (Right: Knee)  Patient Location: PACU  Anesthesia Type:Spinal  Level of Consciousness: awake, alert , oriented, and patient cooperative  Airway & Oxygen Therapy: Patient Spontanous Breathing and Patient connected to face mask oxygen  Post-op Assessment: Report given to RN and Post -op Vital signs reviewed and stable  Post vital signs: Reviewed and stable  Last Vitals:  Vitals Value Taken Time  BP 127/66 05/01/24 09:22  Temp    Pulse 76 05/01/24 09:26  Resp 22 05/01/24 09:26  SpO2 100 % 05/01/24 09:26  Vitals shown include unfiled device data.  Last Pain:  Vitals:   05/01/24 0636  TempSrc: Oral  PainSc:          Complications: No notable events documented.

## 2024-05-01 NOTE — Interval H&P Note (Signed)
 History and Physical Interval Note: The patient understands that she is here today for right total knee replacement to treat her significant right knee pain and arthritis.  There has been no acute or interval change in her medical status.  The risks and benefits of surgery have been discussed in detail and informed consent has been obtained.  The right operative knee has been marked.  05/01/2024 6:47 AM  Sara Nicholson  has presented today for surgery, with the diagnosis of osteoarthritis right knee.  The various methods of treatment have been discussed with the patient and family. After consideration of risks, benefits and other options for treatment, the patient has consented to  Procedure(s): ARTHROPLASTY, KNEE, TOTAL (Right) as a surgical intervention.  The patient's history has been reviewed, patient examined, no change in status, stable for surgery.  I have reviewed the patient's chart and labs.  Questions were answered to the patient's satisfaction.     [name]

## 2024-05-01 NOTE — Anesthesia Postprocedure Evaluation (Signed)
 Anesthesia Post Note  Patient: Alea Ryer Antillon  Procedure(s) Performed: ARTHROPLASTY, KNEE, TOTAL (Right: Knee)     Patient location during evaluation: PACU Anesthesia Type: Spinal Level of consciousness: awake, awake and alert and oriented Pain management: pain level controlled Vital Signs Assessment: post-procedure vital signs reviewed and stable Respiratory status: spontaneous breathing, nonlabored ventilation and respiratory function stable Cardiovascular status: blood pressure returned to baseline and stable Postop Assessment: no headache, no backache, spinal receding and no apparent nausea or vomiting Anesthetic complications: no   No notable events documented.  Last Vitals:  Vitals:   05/01/24 1100 05/01/24 1324  BP: 134/61 133/66  Pulse: 64 (!) 58  Resp: 16 17  Temp: 36.7 C 36.7 C  SpO2: 96% 95%    Last Pain:  Vitals:   05/01/24 1324  TempSrc: Oral  PainSc:                  Garnette BRAVO [place]

## 2024-05-01 NOTE — Anesthesia Procedure Notes (Signed)
 Spinal  Patient location during procedure: OR Start time: 05/01/2024 7:22 AM End time: 05/01/2024 7:32 AM Reason for block: surgical anesthesia Staffing Performed: anesthesiologist  Anesthesiologist: Corinne Garnette BRAVO, MD Performed by: Corinne Garnette BRAVO, MD Authorized by: Corinne Garnette BRAVO, MD   Preanesthetic Checklist Completed: patient identified, IV checked, risks and benefits discussed, surgical consent, monitors and equipment checked, pre-op evaluation and timeout performed Spinal Block Patient position: sitting Prep: DuraPrep and site prepped and draped Patient monitoring: continuous pulse ox and blood pressure Approach: midline Location: L3-4 Injection technique: single-shot Needle Needle type: Pencan  Needle gauge: 24 G Assessment Events: CSF return and second provider Additional Notes Functioning IV was confirmed and monitors were applied. Sterile prep and drape, including hand hygiene, mask and sterile gloves were used. The patient was positioned and the spine was prepped. The skin was anesthetized with lidocaine.  Free flow of clear CSF was obtained prior to injecting local anesthetic into the CSF.  The spinal needle aspirated freely following injection.  The needle was carefully withdrawn.  The patient tolerated the procedure well. Consent was obtained prior to procedure with all questions answered and concerns addressed. Risks including but not limited to bleeding, infection, nerve damage, paralysis, failed block, inadequate analgesia, allergic reaction, high spinal, itching and headache were discussed and the patient wished to proceed.   Garnette Corinne, MD

## 2024-05-01 NOTE — Op Note (Signed)
 Operative Note  Date of operation: 05/01/2024 Preoperative diagnosis: Right knee primary osteoarthritis Postoperative diagnosis: Same  Procedure: Right cemented total knee arthroplasty  Implants: Biomet/Zimmer persona cemented knee system Implant Name Type Inv. Item Serial No. Manufacturer Lot No. LRB No. Used Action  CEMENT BONE R 1X40 - [id number] Cement CEMENT BONE R 1X40  ZIMMER RECON(ORTH,TRAU,BIO,SG) [id number] Right 2 Implanted  COMPONENT TIB CMT PS D 0D RT - [id number] Joint COMPONENT TIB CMT PS D 0D RT  ZIMMER RECON(ORTH,TRAU,BIO,SG) [id number] Right 1 Implanted  LINER ASF PERS CD/1-2 16 RT - [id number] Liner LINER ASF PERS CD/1-2 16 RT  ZIMMER RECON(ORTH,TRAU,BIO,SG) [id number] Right 1 Implanted  COMPONENT FEM CR CEMT STD SZ5 - [id number] Joint COMPONENT FEM CR CEMT STD SZ5  ZIMMER RECON(ORTH,TRAU,BIO,SG) [id number] Right 1 Implanted  STEM POLY PAT PLY 59M KNEE - [id number] Knees STEM POLY PAT PLY 59M KNEE  ZIMMER RECON(ORTH,TRAU,BIO,SG) [id number] Right 1 Implanted   Surgeon: Lonni GRADE. Vernetta, MD Assistant: Tory Gaskins, PA-C  Anesthesia: #1 right lower extremity adductor canal block, #2 spinal, #3 local Antibiotics: IV Ancef  Tourniquet time: Less than an hour EBL: Less than 50 cc Complications: None  Indications: The patient is a 68 year old female with well-known significant osteoarthritis involving her right knee.  We actually replaced her left knee just in April of this year and that is done very well.  This was secondary to arthritis as well.  She has tried and failed all forms of conservative treatment and does wish to proceed with a right total knee replacement today.  Having had this done before recently she is fully aware the risk and benefits of surgery.  Procedure description: After informed consent was obtained and the appropriate right knee was marked, anesthesia obtained a right lower extremity adductor canal block in the holding room.  The patient was then brought  to the operating room and set up on the operative table where spinal anesthesia was obtained.  She was then laid in supine position on the operating table and a Foley catheter was placed.  A nonsterile fingers placed around her upper right thigh and her right thigh, knee, leg, and ankle were prepped and draped in DuraPrep and sterile drapes including a sterile stockinette.  A timeout was called and she is then advised to correct patient the correct right knee.  An Esmarch was then used to wrap out the leg and the tourniquet was inflated to 300 mm pressure.  With the knee extended a direct midline incision was made over the patella and carried proximally distally.  Dissection was carried down the knee joint and a medial parapatellar arthrotomy is made finding a large joint effusion.  There was significant cartilage wear in the medial aspect of her knee as well.  With the knee in a flexed position we placed her extramedullary cutting guide for making her proximal tibia cut.  We did remove the ACL as well as medial and lateral meniscus and osteophytes around the knee.  We then made our proximal tibia cut correcting for varus and valgus and a 7 degree slope.  We made this cut to take 2 mm off the low side and we did back it down to more millimeters.  We then used the intramedullary cutting guide for distal femur cut setting this for a right knee at 5 degrees externally rotated.  We made this cut without difficulty and brought the knee back down to full extension after performing a 10 mm  distal femur cut.  We trialed up  to a 12 mm thickness insert and the extension was almost full.  Of note she does hyperextend with her knees.  We then went back to the femur and put a femoral sizing guide based off the epicondylar axis and Whitesides line.  Based off this we chose a size 5 femur.  We put a 4-in-1 cutting block for a size 5 femur and made our anterior and posterior cuts above our chamfer cuts.  We then  went back to the  tibia and chose a size D right tibial tray for coverage over the tibial plateau setting the rotation of the tibial tubercle and the femur.  We did our drill hole and keel punch over this.  We then trialed our size D right tibia combined with our size 5 right CR standard femur.  We trialed up to a 16 mm thickness polyethylene insert we are pleased with the stability of the 16mm insert.  The seem to match her other side as well.  We then made a patella cut and drilled 3 holes for a size 29 patella button and again trialed the knee with all instrumentation and placement pleasing range of motion and stability.  We then removed all trial instruments from the knee as well as all components.  We irrigate the knee with normal saline solution.  Marcaine  with epinephrine  was placed around the arthrotomy.  Next the cement was mixed in with the knee dried and in a flexed position we cemented our Biomet/Zimmer persona tibial tray for right knee size D followed by cementing our size 5 right CR standard femur.  We placed our 16 mm right medial congruent polyethylene insert and cemented our size 29 patella button.  We then held the knee extended and compressed while the cemented hardened.  Once it hardened hemostasis was obtained electrocautery.  The tourniquet was let down.  The arthrotomy was then closed with interrupted #1 Vicryl suture followed by 0 Vicryl to close the deep tissue and 2-0 Vicryl to close the subcutaneous tissue.  The skin was closed with staples.  Well-padded sterile dressing was applied.  The patient was then taken off of the operating table and taken the recovery room.  Tory Gaskins, PA-C did assist during the entire case from beginning to end and his assistance was crucial and medically necessary for soft tissue management and retraction, helping guide implant placement and a layered closure of the wound.

## 2024-05-01 NOTE — Evaluation (Signed)
 Physical Therapy Evaluation Patient Details Name: Sara Nicholson MRN: [id number] DOB: 1956-07-10 Today's Date: 05/01/2024  History of Present Illness  Pt s/p R TKR and with hs of L TKR (01/17/24) and Parkinsons  Clinical Impression  Pt s/p R TKR and presents with decreased R LE strength/ROM and post op pain limiting functional mobility.  Pt should progress well to dc home with family assist.        If plan is discharge home, recommend the following: A little help with walking and/or transfers;A little help with bathing/dressing/bathroom;Assistance with cooking/housework;Assist for transportation;Help with stairs or ramp for entrance   Can travel by private vehicle        Equipment Recommendations None recommended by PT  Recommendations for Other Services       Functional Status Assessment Patient has had a recent decline in their functional status and demonstrates the ability to make significant improvements in function in a reasonable and predictable amount of time.     Precautions / Restrictions Precautions Precautions: Knee;Fall Required Braces or Orthoses: Knee Immobilizer - Right Knee Immobilizer - Right: Discontinue once straight leg raise with < 10 degree lag (Pt performed IND SLR this date) Restrictions Weight Bearing Restrictions Per Provider Order: No Other Position/Activity Restrictions: WBAT      Mobility  Bed Mobility Overal bed mobility: Needs Assistance Bed Mobility: Supine to Sit     Supine to sit: Min assist     General bed mobility comments: INcreased time with cues for sequence and use of L LE to self assist    Transfers Overall transfer level: Needs assistance Equipment used: Rolling walker (2 wheels) Transfers: Sit to/from Stand Sit to Stand: Min assist           General transfer comment: cues for LE management and use of UEs to self assist    Ambulation/Gait Ambulation/Gait assistance: Min assist Gait Distance (Feet): 58  Feet Assistive device: Rolling walker (2 wheels) Gait Pattern/deviations: Step-to pattern, Decreased step length - right, Decreased step length - left, Shuffle, Trunk flexed Gait velocity: decr     General Gait Details: cues for sequence, posture and position from [employer]            Wheelchair Mobility     Tilt Bed    Modified Rankin (Stroke Patients Only)       Balance Overall balance assessment: Mild deficits observed, not formally tested                                           Pertinent Vitals/Pain Pain Assessment Pain Assessment: 0-10 Pain Score: 4  Pain Location: R knee Pain Descriptors / Indicators: Aching, Burning, Sore Pain Intervention(s): Monitored during session, Limited activity within patient's tolerance, Premedicated before session, Ice applied    Home Living Family/patient expects to be discharged to:: Private residence Living Arrangements: Spouse/significant other Available Help at Discharge: Family;Available 24 hours/day Type of Home: House Home Access: Stairs to enter   [employer] of Steps: 2 + 1   Home Layout: One level Home Equipment: Agricultural consultant (2 wheels);Cane - single point      Prior Function Prior Level of Function : Independent/Modified Independent             Mobility Comments: using RW 2* R knee pain       Extremity/Trunk Assessment   Upper Extremity Assessment Upper Extremity Assessment:  Overall [facility] for tasks assessed    Lower Extremity Assessment Lower Extremity Assessment: RLE deficits/detail RLE Deficits / Details: Performed IND SLR    Cervical / Trunk Assessment Cervical / Trunk Assessment: Normal  Communication   Communication Communication: No apparent difficulties    Cognition Arousal: Alert Behavior During Therapy: WFL for tasks assessed/performed   PT - Cognitive impairments: No apparent impairments                         Following commands:  Intact       Cueing Cueing Techniques: Verbal cues     General Comments      Exercises Total Joint Exercises Ankle Circles/Pumps: AROM, Both, 15 reps, Supine   Assessment/Plan    PT Assessment Patient needs continued PT services  PT Problem List Decreased strength;Decreased range of motion;Decreased activity tolerance;Decreased balance;Decreased mobility;Decreased knowledge of use of DME;Pain       PT Treatment Interventions DME instruction;Gait training;Stair training;Functional mobility training;Therapeutic activities;Therapeutic exercise;Patient/family education    PT Goals (Current goals can be found in the Care Plan section)  Acute Rehab PT Goals Patient Stated Goal: Regain IND PT Goal Formulation: With patient Time For Goal Achievement: 05/07/24 Potential to Achieve Goals: Good    Frequency 7X/week     Co-evaluation               AM-PAC PT 6 Clicks Mobility  Outcome Measure Help needed turning from your back to your side while in a flat bed without using bedrails?: A Little Help needed moving from lying on your back to sitting on the side of a flat bed without using bedrails?: A Little Help needed moving to and from a bed to a chair (including a wheelchair)?: A Little Help needed standing up from a chair using your arms (e.g., wheelchair or bedside chair)?: A Little Help needed to walk in hospital room?: A Little Help needed climbing 3-5 steps with a railing? : A Lot 6 Click Score: 17    End of Session Equipment Utilized During Treatment: Gait belt Activity Tolerance: Patient tolerated treatment well Patient left: in chair;with call bell/phone within reach;with family/visitor present Nurse Communication: Mobility status PT Visit Diagnosis: Difficulty in walking, not elsewhere classified (R26.2)    Time: 8476-8449 PT Time Calculation (min) (ACUTE ONLY): 27 min   Charges:   PT Evaluation $PT Eval Low Complexity: 1 Low PT Treatments $Gait  Training: 8-22 mins PT General Charges $$ ACUTE PT VISIT: 1 Visit         [facility] PT Acute Rehabilitation Services Office [phone]   Leann Mayweather 05/01/2024, 4:20 PM

## 2024-05-01 NOTE — Anesthesia Procedure Notes (Signed)
 Anesthesia Regional Block: Adductor canal block   Pre-Anesthetic Checklist: , timeout performed,  Correct Patient, Correct Site, Correct Laterality,  Correct Procedure, Correct Position, site marked,  Risks and benefits discussed,  Surgical consent,  Pre-op evaluation,  At surgeon's request and post-op pain management  Laterality: Right  Prep: chloraprep       Needles:  Injection technique: Single-shot  Needle Type: Echogenic Needle     Needle Length: 9cm  Needle Gauge: 21     Additional Needles:   Procedures:,,,, ultrasound used (permanent image in chart),,    Narrative:  Start time: 05/01/2024 6:58 AM End time: 05/01/2024 7:03 AM Injection made incrementally with aspirations every 5 mL.  Performed by: Personally  Anesthesiologist: Corinne Garnette BRAVO, MD  Additional Notes: No pain on injection. No increased resistance to injection. Injection made in 5cc increments.  Good needle visualization.  Patient tolerated procedure well.

## 2024-05-02 DIAGNOSIS — Z96652 Presence of left artificial knee joint: Secondary | ICD-10-CM | POA: Diagnosis not present

## 2024-05-02 DIAGNOSIS — M1711 Unilateral primary osteoarthritis, right knee: Secondary | ICD-10-CM | POA: Diagnosis not present

## 2024-05-02 DIAGNOSIS — Z85828 Personal history of other malignant neoplasm of skin: Secondary | ICD-10-CM | POA: Diagnosis not present

## 2024-05-02 DIAGNOSIS — G20C Parkinsonism, unspecified: Secondary | ICD-10-CM | POA: Diagnosis not present

## 2024-05-02 DIAGNOSIS — I10 Essential (primary) hypertension: Secondary | ICD-10-CM | POA: Diagnosis not present

## 2024-05-02 DIAGNOSIS — Z79899 Other long term (current) drug therapy: Secondary | ICD-10-CM | POA: Diagnosis not present

## 2024-05-02 LAB — CBC
HCT: 32.9 % — ABNORMAL LOW (ref 36.0–46.0)
Hemoglobin: 10.6 g/dL — ABNORMAL LOW (ref 12.0–15.0)
MCH: 30 pg (ref 26.0–34.0)
MCHC: 32.2 g/dL (ref 30.0–36.0)
MCV: 93.2 fL (ref 80.0–100.0)
Platelets: 190 K/uL (ref 150–400)
RBC: 3.53 MIL/uL — ABNORMAL LOW (ref 3.87–5.11)
RDW: 14.1 % (ref 11.5–15.5)
WBC: 10.6 K/uL — ABNORMAL HIGH (ref 4.0–10.5)
nRBC: 0 % (ref 0.0–0.2)

## 2024-05-02 LAB — BASIC METABOLIC PANEL WITH GFR
Anion gap: 6 (ref 5–15)
BUN: 15 mg/dL (ref 8–23)
CO2: 26 mmol/L (ref 22–32)
Calcium: 8.7 mg/dL — ABNORMAL LOW (ref 8.9–10.3)
Chloride: 103 mmol/L (ref 98–111)
Creatinine, Ser: 0.59 mg/dL (ref 0.44–1.00)
GFR, Estimated: >60 mL/min (ref >60)
Glucose, Bld: 126 mg/dL — ABNORMAL HIGH (ref 70–99)
Potassium: 4.1 mmol/L (ref 3.5–5.1)
Sodium: 135 mmol/L (ref 135–145)

## 2024-05-02 MED ORDER — KETOROLAC TROMETHAMINE 15 MG/ML IJ SOLN
7.5000 mg | Freq: Once | INTRAMUSCULAR | Status: AC
  Administered 2024-05-02: 7.5 mg via INTRAVENOUS
  Filled 2024-05-02: qty 1

## 2024-05-02 MED ORDER — ASPIRIN 81 MG PO CHEW: 81.0000 mg | CHEWABLE_TABLET | Freq: Two times a day (BID) | ORAL | 0 refills | Status: DC

## 2024-05-02 MED ORDER — OXYCODONE HCL 5 MG PO TABS: 5.0000 mg | ORAL_TABLET | Freq: Four times a day (QID) | ORAL | 0 refills | Status: DC | PRN

## 2024-05-02 MED ORDER — TIZANIDINE HCL 4 MG PO TABS: 4.0000 mg | ORAL_TABLET | Freq: Four times a day (QID) | ORAL | 0 refills | Status: DC | PRN

## 2024-05-02 NOTE — Progress Notes (Signed)
 Physical Therapy Treatment Patient Details Name: Sara Nicholson MRN: [id number] DOB: 1956-01-18 Today's Date: 05/02/2024   History of Present Illness Pt s/p R TKR and with hs of L TKR (01/17/24) and Parkinsons    PT Comments  Pt continues cooperative but admits fatigued.  Pt reviewed written HEP, up to ambulate in hall, negotiated stairs and with questions asked and answered.  Pt eager for dc home this date.    If plan is discharge home, recommend the following: A little help with walking and/or transfers;A little help with bathing/dressing/bathroom;Assistance with cooking/housework;Assist for transportation;Help with stairs or ramp for entrance   Can travel by private vehicle        Equipment Recommendations  None recommended by PT    Recommendations for Other Services       Precautions / Restrictions Precautions Precautions: Knee;Fall Required Braces or Orthoses: Knee Immobilizer - Right Knee Immobilizer - Right: Discontinue once straight leg raise with < 10 degree lag Restrictions Weight Bearing Restrictions Per Provider Order: No Other Position/Activity Restrictions: WBAT     Mobility  Bed Mobility Overal bed mobility: Needs Assistance Bed Mobility: Supine to Sit     Supine to sit: Contact guard, Supervision     General bed mobility comments: up in chair and returns to same    Transfers Overall transfer level: Needs assistance Equipment used: Rolling walker (2 wheels) Transfers: Sit to/from Stand Sit to Stand: Contact guard assist           General transfer comment: cues for LE management and use of UEs to self assist    Ambulation/Gait Ambulation/Gait assistance: Contact guard assist, Supervision Gait Distance (Feet): 60 Feet Assistive device: Rolling walker (2 wheels) Gait Pattern/deviations: Step-to pattern, Decreased step length - right, Decreased step length - left, Shuffle, Trunk flexed Gait velocity: decr     General Gait Details: min cues for  sequence, posture and position from RW   Stairs Stairs: Yes Stairs assistance: Min assist Stair Management: No rails, Step to pattern, Backwards, With walker Number of Stairs: 6 General stair comments: single step twice; two step twice; cues for sequence and foot/RW placement; spouse participating   Wheelchair Mobility     Tilt Bed    Modified Rankin (Stroke Patients Only)       Balance Overall balance assessment: Mild deficits observed, not formally tested                                          Communication Communication Communication: No apparent difficulties  Cognition Arousal: Alert Behavior During Therapy: WFL for tasks assessed/performed   PT - Cognitive impairments: No apparent impairments                         Following commands: Intact      Cueing Cueing Techniques: Verbal cues  Exercises Total Joint Exercises Ankle Circles/Pumps: AROM, Both, 15 reps, Supine Quad Sets: AROM, Both, 10 reps, Supine Heel Slides: AAROM, Right, 15 reps, Supine Straight Leg Raises: AAROM, AROM, Right, 10 reps, Supine    General Comments        Pertinent Vitals/Pain Pain Assessment Pain Assessment: 0-10 Pain Score: 4  Pain Location: R knee Pain Descriptors / Indicators: Aching, Burning, Sore Pain Intervention(s): Limited activity within patient's tolerance, Monitored during session, Premedicated before session, Ice applied    Home Living  Prior Function            PT Goals (current goals can now be found in the care plan section) Acute Rehab PT Goals Patient Stated Goal: Regain IND PT Goal Formulation: With patient Time For Goal Achievement: 05/07/24 Potential to Achieve Goals: Good Progress towards PT goals: Progressing toward goals    Frequency    7X/week      PT Plan      Co-evaluation              AM-PAC PT 6 Clicks Mobility   Outcome Measure  Help needed turning from  your back to your side while in a flat bed without using bedrails?: A Little Help needed moving from lying on your back to sitting on the side of a flat bed without using bedrails?: A Little Help needed moving to and from a bed to a chair (including a wheelchair)?: A Little Help needed standing up from a chair using your arms (e.g., wheelchair or bedside chair)?: A Little Help needed to walk in hospital room?: A Little Help needed climbing 3-5 steps with a railing? : A Little 6 Click Score: 18    End of Session Equipment Utilized During Treatment: Gait belt Activity Tolerance: Patient tolerated treatment well Patient left: in chair;with call bell/phone within reach;with family/visitor present Nurse Communication: Mobility status PT Visit Diagnosis: Difficulty in walking, not elsewhere classified (R26.2)     Time: 1335-1410 PT Time Calculation (min) (ACUTE ONLY): 35 min  Charges:    $Gait Training: 8-22 mins $Therapeutic Activity: 8-22 mins PT General Charges $$ ACUTE PT VISIT: 1 Visit                     [facility] PT Acute Rehabilitation Services Office [phone]    [facility] 05/02/2024, 4:33 PM

## 2024-05-02 NOTE — Plan of Care (Signed)
   Problem: Pain Management: Goal: Pain level will decrease with appropriate interventions Outcome: Progressing

## 2024-05-02 NOTE — Progress Notes (Signed)
 Physical Therapy Treatment Patient Details Name: Sara Nicholson MRN: [id number] DOB: [birth year] Today's Date: 05/02/2024   History of Present Illness Pt s/p R TKR and with hs of L TKR (01/17/24) and Parkinsons    PT Comments  Pt cooperative and progressing steadily with mobility but requiring increased time and reports significantly more pain than prior TKR.  Pt up to ambulate increased distance in hall and HEP initiated.  Will follow up with stair training as tolerated.    If plan is discharge home, recommend the following: A little help with walking and/or transfers;A little help with bathing/dressing/bathroom;Assistance with cooking/housework;Assist for transportation;Help with stairs or ramp for entrance   Can travel by private vehicle        Equipment Recommendations  None recommended by PT    Recommendations for Other Services       Precautions / Restrictions Precautions Precautions: Knee;Fall Required Braces or Orthoses: Knee Immobilizer - Right Knee Immobilizer - Right: Discontinue once straight leg raise with < 10 degree lag Restrictions Weight Bearing Restrictions Per Provider Order: No Other Position/Activity Restrictions: WBAT     Mobility  Bed Mobility Overal bed mobility: Needs Assistance Bed Mobility: Supine to Sit     Supine to sit: Contact guard, Supervision     General bed mobility comments: INcreased time with cues for sequence and use of L LE to self assist    Transfers Overall transfer level: Needs assistance Equipment used: Rolling walker (2 wheels) Transfers: Sit to/from Stand Sit to Stand: Contact guard assist           General transfer comment: cues for LE management and use of UEs to self assist    Ambulation/Gait Ambulation/Gait assistance: Contact guard assist Gait Distance (Feet): 85 Feet Assistive device: Rolling walker (2 wheels) Gait Pattern/deviations: Step-to pattern, Decreased step length - right, Decreased step length -  left, Shuffle, Trunk flexed Gait velocity: decr     General Gait Details: cues for sequence, posture and position from [employer]             Wheelchair Mobility     Tilt Bed    Modified Rankin (Stroke Patients Only)       Balance Overall balance assessment: Mild deficits observed, not formally tested                                          Communication Communication Communication: No apparent difficulties  Cognition Arousal: Alert Behavior During Therapy: WFL for tasks assessed/performed   PT - Cognitive impairments: No apparent impairments                         Following commands: Intact      Cueing Cueing Techniques: Verbal cues  Exercises Total Joint Exercises Ankle Circles/Pumps: AROM, Both, 15 reps, Supine Quad Sets: AROM, Both, 10 reps, Supine Heel Slides: AAROM, Right, 15 reps, Supine Straight Leg Raises: AAROM, AROM, Right, 10 reps, Supine    General Comments        Pertinent Vitals/Pain Pain Assessment Pain Assessment: 0-10 Pain Score: 5  Pain Location: R knee Pain Descriptors / Indicators: Aching, Burning, Sore Pain Intervention(s): Limited activity within patient's tolerance, Monitored during session, Premedicated before session, Ice applied    Home Living  Prior Function            PT Goals (current goals can now be found in the care plan section) Acute Rehab PT Goals Patient Stated Goal: Regain IND PT Goal Formulation: With patient Time For Goal Achievement: 05/07/24 Potential to Achieve Goals: Good Progress towards PT goals: Progressing toward goals    Frequency    7X/week      PT Plan      Co-evaluation              AM-PAC PT 6 Clicks Mobility   Outcome Measure  Help needed turning from your back to your side while in a flat bed without using bedrails?: A Little Help needed moving from lying on your back to sitting on the side of a flat  bed without using bedrails?: A Little Help needed moving to and from a bed to a chair (including a wheelchair)?: A Little Help needed standing up from a chair using your arms (e.g., wheelchair or bedside chair)?: A Little Help needed to walk in hospital room?: A Little Help needed climbing 3-5 steps with a railing? : A Lot 6 Click Score: 17    End of Session Equipment Utilized During Treatment: Gait belt Activity Tolerance: Patient tolerated treatment well Patient left: in chair;with call bell/phone within reach Nurse Communication: Mobility status PT Visit Diagnosis: Difficulty in walking, not elsewhere classified (R26.2)     Time: 0925-1010 PT Time Calculation (min) (ACUTE ONLY): 45 min  Charges:    $Gait Training: 8-22 mins $Therapeutic Exercise: 8-22 mins $Therapeutic Activity: 8-22 mins PT General Charges $$ ACUTE PT VISIT: 1 Visit                     [facility] PT Acute Rehabilitation Services Office [phone]    Vernida Mcnicholas 05/02/2024, 12:06 PM

## 2024-05-02 NOTE — TOC Transition Note (Signed)
 Transition of Care [facility]) - Discharge Note   Patient Details  Name: Sara Nicholson MRN: [id number] Date of Birth: 1956-04-19  Transition of [facility] CM/SW Contact:  Sheri ONEIDA Sharps, LCSW Phone Number: 05/02/2024, 12:12 PM   Clinical Narrative:    Pt dc home w/ HHPT w/ [facility]. Pt has DME at home. No further TOC needs.    Final next level of care: Home w Home Health Services Barriers to Discharge: Barriers Resolved   Patient Goals and CMS Choice Patient states their goals for this hospitalization and ongoing recovery are:: return home   Choice offered to / list presented to : NA      Discharge Placement                       Discharge Plan and Services Additional resources added to the After Visit Summary for                  DME Arranged: N/A DME Agency: NA       HH Arranged: NA HH Agency: NA        Social Drivers of Health (SDOH) Interventions SDOH Screenings   Food Insecurity: No Food Insecurity (05/01/2024)  Housing: Low Risk  (05/01/2024)  Transportation Needs: No Transportation Needs (05/01/2024)  Utilities: Not At Risk (05/01/2024)  Alcohol Screen: Low Risk  (09/16/2023)  Depression (PHQ2-9): Low Risk  (09/23/2023)  Financial Resource Strain: Low Risk  (09/16/2023)  Physical Activity: Sufficiently Active (09/16/2023)  Social Connections: Socially Integrated (05/01/2024)  Stress: No Stress Concern Present (09/16/2023)  Tobacco Use: Low Risk  (05/01/2024)  Health Literacy: Adequate Health Literacy (09/23/2023)     Readmission Risk Interventions     No data to display

## 2024-05-02 NOTE — Plan of Care (Signed)

## 2024-05-02 NOTE — Discharge Summary (Signed)
 Patient ID: Sara Nicholson MRN: [id number] DOB/AGE: Nov 30, 1955 68 y.o.  Admit date: 05/01/2024 Discharge date: 05/02/2024  Admission Diagnoses:  Principal Problem:   Unilateral primary osteoarthritis, right knee Active Problems:   Status post total right knee replacement   Discharge Diagnoses:  Same  Past Medical History:  Diagnosis Date   Arthritis    Cancer (HCC)    squamous lesion right shoulder   Depression    Hypertension    Neuromuscular disorder (HCC)    Parkinson disease (HCC)    Pneumonia 1988    Surgeries: Procedure(s): ARTHROPLASTY, KNEE, TOTAL on 05/01/2024   Consultants:   Discharged Condition: Improved  Hospital Course: Sara Nicholson is an 68 y.o. female who was admitted 05/01/2024 for operative treatment ofUnilateral primary osteoarthritis, right knee. Patient has severe unremitting pain that affects sleep, daily activities, and work/hobbies. After pre-op clearance the patient was taken to the operating room on 05/01/2024 and underwent  Procedure(s): ARTHROPLASTY, KNEE, TOTAL.    Patient was given perioperative antibiotics:  Anti-infectives (From admission, onward)    Start     Dose/Rate Route Frequency Ordered Stop   05/01/24 1330  ceFAZolin  (ANCEF ) IVPB 2g/100 mL premix        2 g 200 mL/hr over 30 Minutes Intravenous Every 6 hours 05/01/24 1051 05/01/24 1857   05/01/24 0600  ceFAZolin  (ANCEF ) IVPB 2g/100 mL premix        2 g 200 mL/hr over 30 Minutes Intravenous On call to O.R. 05/01/24 0536 05/01/24 9266        Patient was given sequential compression devices, early ambulation, and chemoprophylaxis to prevent DVT.  Patient benefited maximally from hospital stay and there were no complications.    Recent vital signs: Patient Vitals for the past 24 hrs:  BP Temp Temp src Pulse Resp SpO2  05/02/24 0458 138/61 98.9 F (37.2 C) Oral 67 18 94 %  05/02/24 0136 (!) 125/58 98.1 F (36.7 C) Oral (!) 58 18 95 %  05/01/24 2106 128/67 97.7 F (36.5 C)  Oral (!) 58 18 96 %  05/01/24 1757 131/62 98.7 F (37.1 C) Oral (!) 59 17 97 %  05/01/24 1324 133/66 98 F (36.7 C) Oral (!) 58 17 95 %     Recent laboratory studies:  Recent Labs    05/01/24 1039 05/02/24 0335  WBC 7.7 10.6*  HGB 12.0 10.6*  HCT 38.0 32.9*  PLT 217 190  NA 140 135  K 4.8 4.1  CL 108 103  CO2 25 26  BUN 11 15  CREATININE 0.57 0.59  GLUCOSE 136* 126*  CALCIUM 9.2 8.7*     Discharge Medications:   Allergies as of 05/02/2024       Reactions   Chlorhexidine  Rash   Tape Rash   prefers fabric band-aids    Terbinafine Hcl Hives        Medication List     TAKE these medications    aspirin  81 MG chewable tablet Chew 1 tablet (81 mg total) by mouth 2 (two) times daily.   carbidopa -levodopa  25-100 MG tablet Commonly known as: SINEMET  IR 6am/10am/2pm/6pm   escitalopram  20 MG tablet Commonly known as: LEXAPRO  TAKE ONE TABLET BY MOUTH ONE TIME DAILY   ketotifen  0.035 % ophthalmic solution Commonly known as: ZADITOR  Place 1 drop into both eyes daily as needed (itching eyes).   meloxicam  15 MG tablet Commonly known as: MOBIC  Take 1 tablet (15 mg total) by mouth daily. What changed:  when to take this  reasons to take this   oxyCODONE  5 MG immediate release tablet Commonly known as: Oxy IR/ROXICODONE  Take 1-2 tablets (5-10 mg total) by mouth every 6 (six) hours as needed for moderate pain (pain score 4-6) (pain score 4-6).   pramipexole  0.5 MG tablet Commonly known as: MIRAPEX  TAKE ONE TABLET BY MOUTH THREE TIMES A DAY (8:00AM, 1:00PM, AND 6:00PM)   tiZANidine  4 MG tablet Commonly known as: ZANAFLEX  Take 1 tablet (4 mg total) by mouth every 6 (six) hours as needed for muscle spasms.               Durable Medical Equipment  (From admission, onward)           Start     Ordered   05/01/24 1052  DME 3 n 1  Once        05/01/24 1051   05/01/24 1052  DME Walker rolling  Once       Question Answer Comment  Walker: With 5 Inch  Wheels   Patient needs a walker to treat with the following condition Status post total right knee replacement      05/01/24 1051            Diagnostic Studies: DG Knee Right Port Result Date: 05/01/2024 CLINICAL DATA:  Postop for total knee replacement EXAM: PORTABLE RIGHT KNEE - 1-2 VIEW COMPARISON:  01/23/2023 FINDINGS: Right knee arthroplasty. No periprosthetic fracture or acute hardware complication. Anterior surgical staples. Subcutaneous edema and gas anteriorly. IMPRESSION: Expected appearance after right knee arthroplasty. Electronically Signed   By: Rockey Kilts M.D.   On: 05/01/2024 10:38    Disposition: Discharge disposition: 01-Home or Self Care          Follow-up Information     Vernetta Lonni GRADE, MD Follow up in 2 week(s).   Specialty: Orthopedic Surgery Contact information: [address] [place] [address] [phone]                  Signed: Lonni GRADE Vernetta 05/02/2024, 12:29 PM

## 2024-05-02 NOTE — Care Management Obs Status (Signed)
 MEDICARE OBSERVATION STATUS NOTIFICATION   Patient Details  Name: Sara Nicholson MRN: [id number] Date of Birth: [birth year]   Medicare Observation Status Notification Given:  Yes    Sheri ONEIDA Sharps, LCSW 05/02/2024, 10:40 AM

## 2024-05-02 NOTE — Discharge Instructions (Signed)
 Per [facility] clinic policy, our goal is ensure optimal postoperative pain control with a multimodal pain management strategy. For all [facility] patients, our goal is to wean post-operative narcotic medications by 6 weeks post-operatively. If this is not possible due to utilization of pain medication prior to surgery, your [facility] doctor will support your acute post-operative pain control for the first 6 weeks postoperatively, with a plan to transition you back to your primary pain team following that. Sara Nicholson will work to ensure a Therapist, occupational.  INSTRUCTIONS AFTER JOINT REPLACEMENT   Remove items at home which could result in a fall. This includes throw rugs or furniture in walking pathways ICE to the affected joint every three hours while awake for 30 minutes at a time, for at least the first 3-5 days, and then as needed for pain and swelling.  Continue to use ice for pain and swelling. You may notice swelling that will progress down to the foot and ankle.  This is normal after surgery.  Elevate your leg when you are not up walking on it.   Continue to use the breathing machine you got in the hospital (incentive spirometer) which will help keep your temperature down.  It is common for your temperature to cycle up and down following surgery, especially at night when you are not up moving around and exerting yourself.  The breathing machine keeps your lungs expanded and your temperature down.   DIET:  As you were doing prior to hospitalization, we recommend a well-balanced diet.  DRESSING / WOUND CARE / SHOWERING  Keep the surgical dressing until follow up.  The dressing is water proof, so you can shower without any extra covering.  IF THE DRESSING FALLS OFF or the wound gets wet inside, change the dressing with sterile gauze.  Please use good hand washing techniques before changing the dressing.  Do not use any lotions or creams on the incision until instructed by your surgeon.     ACTIVITY  Increase activity slowly as tolerated, but follow the weight bearing instructions below.   No driving for 6 weeks or until further direction given by your physician.  You cannot drive while taking narcotics.  No lifting or carrying greater than 10 lbs. until further directed by your surgeon. Avoid periods of inactivity such as sitting longer than an hour when not asleep. This helps prevent blood clots.  You may return to work once you are authorized by your doctor.     WEIGHT BEARING   Weight bearing as tolerated with assist device (walker, cane, etc) as directed, use it as long as suggested by your surgeon or therapist, typically at least 4-6 weeks.   EXERCISES  Results after joint replacement surgery are often greatly improved when you follow the exercise, range of motion and muscle strengthening exercises prescribed by your doctor. Safety measures are also important to protect the joint from further injury. Any time any of these exercises cause you to have increased pain or swelling, decrease what you are doing until you are comfortable again and then slowly increase them. If you have problems or questions, call your caregiver or physical therapist for advice.   Rehabilitation is important following a joint replacement. After just a few days of immobilization, the muscles of the leg can become weakened and shrink (atrophy).  These exercises are designed to build up the tone and strength of the thigh and leg muscles and to improve motion. Often times heat used for twenty to thirty minutes before  working out will loosen up your tissues and help with improving the range of motion but do not use heat for the first two weeks following surgery (sometimes heat can increase post-operative swelling).   These exercises can be done on a training (exercise) mat, on the floor, on a table or on a bed. Use whatever works the best and is most comfortable for you.    Use music or television  while you are exercising so that the exercises are a pleasant break in your day. This will make your life better with the exercises acting as a break in your routine that you can look forward to.   Perform all exercises about fifteen times, three times per day or as directed.  You should exercise both the operative leg and the other leg as well.  Exercises include:   Quad Sets - Tighten up the muscle on the front of the thigh (Quad) and hold for 5-10 seconds.   Straight Leg Raises - With your knee straight (if you were given a brace, keep it on), lift the leg to 60 degrees, hold for 3 seconds, and slowly lower the leg.  Perform this exercise against resistance later as your leg gets stronger.  Leg Slides: Lying on your back, slowly slide your foot toward your buttocks, bending your knee up off the floor (only go as far as is comfortable). Then slowly slide your foot back down until your leg is flat on the floor again.  Angel Wings: Lying on your back spread your legs to the side as far apart as you can without causing discomfort.  Hamstring Strength:  Lying on your back, push your heel against the floor with your leg straight by tightening up the muscles of your buttocks.  Repeat, but this time bend your knee to a comfortable angle, and push your heel against the floor.  You may put a pillow under the heel to make it more comfortable if necessary.   A rehabilitation program following joint replacement surgery can speed recovery and prevent re-injury in the future due to weakened muscles. Contact your doctor or a physical therapist for more information on knee rehabilitation.    CONSTIPATION  Constipation is defined medically as fewer than three stools per week and severe constipation as less than one stool per week.  Even if you have a regular bowel pattern at home, your normal regimen is likely to be disrupted due to multiple reasons following surgery.  Combination of anesthesia, postoperative  narcotics, change in appetite and fluid intake all can affect your bowels.   YOU MUST use at least one of the following options; they are listed in order of increasing strength to get the job done.  They are all available over the counter, and you may need to use some, POSSIBLY even all of these options:    Drink plenty of fluids (prune juice may be helpful) and high fiber foods Colace 100 mg by mouth twice a day  Senokot for constipation as directed and as needed Dulcolax (bisacodyl), take with full glass of water  Miralax (polyethylene glycol) once or twice a day as needed.  If you have tried all these things and are unable to have a bowel movement in the first 3-4 days after surgery call either your surgeon or your primary doctor.    If you experience loose stools or diarrhea, hold the medications until you stool forms back up.  If your symptoms do not get better within 1 week  or if they get worse, check with your doctor.  If you experience "the worst abdominal pain ever" or develop nausea or vomiting, please contact the office immediately for further recommendations for treatment.   ITCHING:  If you experience itching with your medications, try taking only a single pain pill, or even half a pain pill at a time.  You can also use Benadryl over the counter for itching or also to help with sleep.   TED HOSE STOCKINGS:  Use stockings on both legs until for at least 2 weeks or as directed by physician office. They may be removed at night for sleeping.  MEDICATIONS:  See your medication summary on the "After Visit Summary" that nursing will review with you.  You may have some home medications which will be placed on hold until you complete the course of blood thinner medication.  It is important for you to complete the blood thinner medication as prescribed.  PRECAUTIONS:  If you experience chest pain or shortness of breath - call 911 immediately for transfer to the hospital emergency department.    If you develop a fever greater that 101 F, purulent drainage from wound, increased redness or drainage from wound, foul odor from the wound/dressing, or calf pain - CONTACT YOUR SURGEON.                                                   FOLLOW-UP APPOINTMENTS:  If you do not already have a post-op appointment, please call the office for an appointment to be seen by your surgeon.  Guidelines for how soon to be seen are listed in your "After Visit Summary", but are typically between 1-4 weeks after surgery.  OTHER INSTRUCTIONS:   Knee Replacement:  Do not place pillow under knee, focus on keeping the knee straight while resting. CPM instructions: 0-90 degrees, 2 hours in the morning, 2 hours in the afternoon, and 2 hours in the evening. Place foam block, curve side up under heel at all times except when in CPM or when walking.  DO NOT modify, tear, cut, or change the foam block in any way.  POST-OPERATIVE OPIOID TAPER INSTRUCTIONS: It is important to wean off of your opioid medication as soon as possible. If you do not need pain medication after your surgery it is ok to stop day one. Opioids include: Codeine, Hydrocodone(Norco, Vicodin), Oxycodone(Percocet, oxycontin) and hydromorphone amongst others.  Long term and even short term use of opiods can cause: Increased pain response Dependence Constipation Depression Respiratory depression And more.  Withdrawal symptoms can include Flu like symptoms Nausea, vomiting And more Techniques to manage these symptoms Hydrate well Eat regular healthy meals Stay active Use relaxation techniques(deep breathing, meditating, yoga) Do Not substitute Alcohol to help with tapering If you have been on opioids for less than two weeks and do not have pain than it is ok to stop all together.  Plan to wean off of opioids This plan should start within one week post op of your joint replacement. Maintain the same interval or time between taking each dose  and first decrease the dose.  Cut the total daily intake of opioids by one tablet each day Next start to increase the time between doses. The last dose that should be eliminated is the evening dose.   MAKE SURE YOU:  Understand these instructions.  Get help right away if you are not doing well or get worse.    Thank you for letting us be a part of your medical care team.  It is a privilege we respect greatly.  We hope these instructions will help you stay on track for a fast and full recovery!      Dental Antibiotics:  In most cases prophylactic antibiotics for Dental procdeures after total joint surgery are not necessary.  Exceptions are as follows:  1. History of prior total joint infection  2. Severely immunocompromised (Organ Transplant, cancer chemotherapy, Rheumatoid biologic meds such as Humera)  3. Poorly controlled diabetes (A1C &gt; 8.0, blood glucose over 200)  If you have one of these conditions, contact your surgeon for an antibiotic prescription, prior to your dental procedure.

## 2024-05-02 NOTE — Progress Notes (Signed)
 Subjective: 1 Day Post-Op Procedure(s) (LRB): ARTHROPLASTY, KNEE, TOTAL (Right) Patient reports pain as moderate.    Objective: Vital signs in last 24 hours: Temp:  [97.7 F (36.5 C)-98.9 F (37.2 C)] 98.9 F (37.2 C) (07/26 0458) Pulse Rate:  [58-67] 67 (07/26 0458) Resp:  [17-18] 18 (07/26 0458) BP: (125-138)/(58-67) 138/61 (07/26 0458) SpO2:  [94 %-97 %] 94 % (07/26 0458)  Intake/Output from previous day: 07/25 0701 - 07/26 0700 In: 2955.3 [P.O.:440; I.V.:2215.3; IV Piggyback:300] Out: 1890 [Urine:1850; Blood:40] Intake/Output this shift: Total I/O In: 240 [P.O.:240] Out: 100 [Urine:100]  Recent Labs    05/01/24 1039 05/02/24 0335  HGB 12.0 10.6*   Recent Labs    05/01/24 1039 05/02/24 0335  WBC 7.7 10.6*  RBC 4.09 3.53*  HCT 38.0 32.9*  PLT 217 190   Recent Labs    05/01/24 1039 05/02/24 0335  NA 140 135  K 4.8 4.1  CL 108 103  CO2 25 26  BUN 11 15  CREATININE 0.57 0.59  GLUCOSE 136* 126*  CALCIUM 9.2 8.7*   No results for input(s): LABPT, INR in the last 72 hours.  Sensation intact distally Intact pulses distally Dorsiflexion/Plantar flexion intact Incision: dressing C/D/I Compartment soft   Assessment/Plan: 1 Day Post-Op Procedure(s) (LRB): ARTHROPLASTY, KNEE, TOTAL (Right) Up with therapy Discharge home with home health      [name] 05/02/2024, 12:28 PM

## 2024-05-03 DIAGNOSIS — I10 Essential (primary) hypertension: Secondary | ICD-10-CM | POA: Diagnosis not present

## 2024-05-03 DIAGNOSIS — Z85828 Personal history of other malignant neoplasm of skin: Secondary | ICD-10-CM | POA: Diagnosis not present

## 2024-05-03 DIAGNOSIS — Z791 Long term (current) use of non-steroidal anti-inflammatories (NSAID): Secondary | ICD-10-CM | POA: Diagnosis not present

## 2024-05-03 DIAGNOSIS — Z7982 Long term (current) use of aspirin: Secondary | ICD-10-CM | POA: Diagnosis not present

## 2024-05-03 DIAGNOSIS — Z471 Aftercare following joint replacement surgery: Secondary | ICD-10-CM | POA: Diagnosis not present

## 2024-05-03 DIAGNOSIS — Z96651 Presence of right artificial knee joint: Secondary | ICD-10-CM | POA: Diagnosis not present

## 2024-05-03 DIAGNOSIS — G20A1 Parkinson's disease without dyskinesia, without mention of fluctuations: Secondary | ICD-10-CM | POA: Diagnosis not present

## 2024-05-04 ENCOUNTER — Encounter (HOSPITAL_COMMUNITY): Payer: Self-pay | Admitting: Orthopaedic Surgery

## 2024-05-05 DIAGNOSIS — Z85828 Personal history of other malignant neoplasm of skin: Secondary | ICD-10-CM | POA: Diagnosis not present

## 2024-05-05 DIAGNOSIS — I10 Essential (primary) hypertension: Secondary | ICD-10-CM | POA: Diagnosis not present

## 2024-05-05 DIAGNOSIS — Z7982 Long term (current) use of aspirin: Secondary | ICD-10-CM | POA: Diagnosis not present

## 2024-05-05 DIAGNOSIS — Z471 Aftercare following joint replacement surgery: Secondary | ICD-10-CM | POA: Diagnosis not present

## 2024-05-05 DIAGNOSIS — Z791 Long term (current) use of non-steroidal anti-inflammatories (NSAID): Secondary | ICD-10-CM | POA: Diagnosis not present

## 2024-05-05 DIAGNOSIS — Z96651 Presence of right artificial knee joint: Secondary | ICD-10-CM | POA: Diagnosis not present

## 2024-05-05 DIAGNOSIS — G20A1 Parkinson's disease without dyskinesia, without mention of fluctuations: Secondary | ICD-10-CM | POA: Diagnosis not present

## 2024-05-07 DIAGNOSIS — Z96651 Presence of right artificial knee joint: Secondary | ICD-10-CM | POA: Diagnosis not present

## 2024-05-07 DIAGNOSIS — Z791 Long term (current) use of non-steroidal anti-inflammatories (NSAID): Secondary | ICD-10-CM | POA: Diagnosis not present

## 2024-05-07 DIAGNOSIS — I10 Essential (primary) hypertension: Secondary | ICD-10-CM | POA: Diagnosis not present

## 2024-05-07 DIAGNOSIS — Z471 Aftercare following joint replacement surgery: Secondary | ICD-10-CM | POA: Diagnosis not present

## 2024-05-07 DIAGNOSIS — Z7982 Long term (current) use of aspirin: Secondary | ICD-10-CM | POA: Diagnosis not present

## 2024-05-07 DIAGNOSIS — G20A1 Parkinson's disease without dyskinesia, without mention of fluctuations: Secondary | ICD-10-CM | POA: Diagnosis not present

## 2024-05-07 DIAGNOSIS — Z85828 Personal history of other malignant neoplasm of skin: Secondary | ICD-10-CM | POA: Diagnosis not present

## 2024-05-07 DIAGNOSIS — M1712 Unilateral primary osteoarthritis, left knee: Secondary | ICD-10-CM | POA: Diagnosis not present

## 2024-05-08 DIAGNOSIS — Z471 Aftercare following joint replacement surgery: Secondary | ICD-10-CM | POA: Diagnosis not present

## 2024-05-08 DIAGNOSIS — Z791 Long term (current) use of non-steroidal anti-inflammatories (NSAID): Secondary | ICD-10-CM | POA: Diagnosis not present

## 2024-05-08 DIAGNOSIS — G20A1 Parkinson's disease without dyskinesia, without mention of fluctuations: Secondary | ICD-10-CM | POA: Diagnosis not present

## 2024-05-08 DIAGNOSIS — Z7982 Long term (current) use of aspirin: Secondary | ICD-10-CM | POA: Diagnosis not present

## 2024-05-08 DIAGNOSIS — I10 Essential (primary) hypertension: Secondary | ICD-10-CM | POA: Diagnosis not present

## 2024-05-08 DIAGNOSIS — Z85828 Personal history of other malignant neoplasm of skin: Secondary | ICD-10-CM | POA: Diagnosis not present

## 2024-05-08 DIAGNOSIS — Z96651 Presence of right artificial knee joint: Secondary | ICD-10-CM | POA: Diagnosis not present

## 2024-05-11 ENCOUNTER — Other Ambulatory Visit: Payer: Self-pay

## 2024-05-11 ENCOUNTER — Encounter: Payer: Self-pay | Admitting: Neurology

## 2024-05-11 DIAGNOSIS — Z471 Aftercare following joint replacement surgery: Secondary | ICD-10-CM | POA: Diagnosis not present

## 2024-05-11 DIAGNOSIS — G20A1 Parkinson's disease without dyskinesia, without mention of fluctuations: Secondary | ICD-10-CM

## 2024-05-11 DIAGNOSIS — I10 Essential (primary) hypertension: Secondary | ICD-10-CM | POA: Diagnosis not present

## 2024-05-11 DIAGNOSIS — Z96651 Presence of right artificial knee joint: Secondary | ICD-10-CM | POA: Diagnosis not present

## 2024-05-11 DIAGNOSIS — Z85828 Personal history of other malignant neoplasm of skin: Secondary | ICD-10-CM | POA: Diagnosis not present

## 2024-05-11 DIAGNOSIS — Z7982 Long term (current) use of aspirin: Secondary | ICD-10-CM | POA: Diagnosis not present

## 2024-05-11 DIAGNOSIS — Z791 Long term (current) use of non-steroidal anti-inflammatories (NSAID): Secondary | ICD-10-CM | POA: Diagnosis not present

## 2024-05-11 NOTE — Telephone Encounter (Signed)
 Called  pharmacy and got RX sorted out they are working on it now. Called pateint and let her know

## 2024-05-13 DIAGNOSIS — G20A1 Parkinson's disease without dyskinesia, without mention of fluctuations: Secondary | ICD-10-CM | POA: Diagnosis not present

## 2024-05-13 DIAGNOSIS — Z7982 Long term (current) use of aspirin: Secondary | ICD-10-CM | POA: Diagnosis not present

## 2024-05-13 DIAGNOSIS — Z471 Aftercare following joint replacement surgery: Secondary | ICD-10-CM | POA: Diagnosis not present

## 2024-05-13 DIAGNOSIS — Z791 Long term (current) use of non-steroidal anti-inflammatories (NSAID): Secondary | ICD-10-CM | POA: Diagnosis not present

## 2024-05-13 DIAGNOSIS — Z85828 Personal history of other malignant neoplasm of skin: Secondary | ICD-10-CM | POA: Diagnosis not present

## 2024-05-13 DIAGNOSIS — I10 Essential (primary) hypertension: Secondary | ICD-10-CM | POA: Diagnosis not present

## 2024-05-13 DIAGNOSIS — Z96651 Presence of right artificial knee joint: Secondary | ICD-10-CM | POA: Diagnosis not present

## 2024-05-14 ENCOUNTER — Ambulatory Visit (INDEPENDENT_AMBULATORY_CARE_PROVIDER_SITE_OTHER): Admitting: Orthopaedic Surgery

## 2024-05-14 ENCOUNTER — Encounter: Payer: Self-pay | Admitting: Orthopaedic Surgery

## 2024-05-14 DIAGNOSIS — Z96651 Presence of right artificial knee joint: Secondary | ICD-10-CM

## 2024-05-14 NOTE — Progress Notes (Signed)
 The patient is here for first postoperative visit status post a right total knee arthroplasty.  We did replace her left knee in just April of this year.  She is already doing well.  She has noted the week before she starts outpatient therapy so Sara Nicholson is going to see about getting her another visit or 2 with home health therapy.  Examination of her right knee shows the incision looks good.  Staples are removed and stress is applied.  Her extension is full and her flexion is to 91 degrees.  She is swollen but her calf is soft.  We have recommended compressive hose as well.  We will see her back in a month to see how she is doing overall from a range of motion standpoint.  If she does have any issues before then she knows to reach out and let us  know.

## 2024-05-19 NOTE — Therapy (Signed)
 OUTPATIENT PHYSICAL THERAPY LOWER EXTREMITY EVALUATION   Patient Name: Sara Nicholson MRN: [id number] DOB:27-Feb-1956, 68 y.o., female Today's Date: 05/20/2024  END OF SESSION:  PT End of Session - 05/20/24 0852     Visit Number 1    Date for PT Re-Evaluation 08/12/24    Authorization Type [employer]    PT Start Time [phone]    PT Stop Time 0930    PT Time Calculation (min) 39 min          Past Medical History:  Diagnosis Date   Arthritis    Cancer (HCC)    squamous lesion right shoulder   Depression    Hypertension    Neuromuscular disorder (HCC)    Parkinson disease (HCC)    Pneumonia 1988   Past Surgical History:  Procedure Laterality Date   APPENDECTOMY     BREAST EXCISIONAL BIOPSY Left    benign cyst   CESAREAN SECTION     x1   COLONOSCOPY     TOTAL KNEE ARTHROPLASTY Left 01/17/2024   Procedure: ARTHROPLASTY, KNEE, TOTAL;  Surgeon: Vernetta Lonni GRADE, MD;  Location: [facility];  Service: Orthopedics;  Laterality: Left;   TOTAL KNEE ARTHROPLASTY Right 05/01/2024   Procedure: ARTHROPLASTY, KNEE, TOTAL;  Surgeon: Vernetta Lonni GRADE, MD;  Location: [facility];  Service: Orthopedics;  Laterality: Right;   Patient Active Problem List   Diagnosis Date Noted   Status post total right knee replacement 05/01/2024   Status post total left knee replacement 01/17/2024   Unilateral primary osteoarthritis, right knee 01/15/2023   Hyperlipidemia 11/08/2022   Class 2 severe obesity due to excess calories with serious comorbidity and body mass index (BMI) of 37.0 to 37.9 in adult (HCC) 11/08/2022   Parkinson's disease (HCC) 01/16/2021    PCP: Charmaine Bright  REFERRING PROVIDER: Lonni Vernetta  REFERRING DIAG:  [phone] (ICD-10-CM) - Status post total right knee replacement  M17.11 (ICD-10-CM) - Unilateral primary osteoarthritis, right knee    THERAPY DIAG:  Localized edema  S/P total knee arthroplasty, right  Difficulty in walking, not elsewhere classified  Acute  pain of right knee  Stiffness of right knee, not elsewhere classified  Rationale for Evaluation and Treatment: Rehabilitation  ONSET DATE: 05/01/24  SUBJECTIVE:   SUBJECTIVE STATEMENT: I don't really have pain. I am walking without a cane. I have been doing things it the house- sweeping, mopping, went down to the basement to the laundry. Doctor cleared me to drive.   05/14/24- First follow up with surgeon. Her extension is full and her flexion is to 91 degrees.  She is swollen but her calf is soft.  We have recommended compressive hose as well.    PERTINENT HISTORY: Parkinson's  L TKA 01/17/24 R TKA 05/01/24  PAIN:  Are you having pain? No  PRECAUTIONS: Knee  RED FLAGS: None   WEIGHT BEARING RESTRICTIONS: No  FALLS:  Has patient fallen in last 6 months? No  LIVING ENVIRONMENT: Lives with: lives with their spouse Lives in: House/apartment Stairs: Yes: Internal: 13 to get to basement steps; can reach both and External: 3 steps; none and but has a grab Has following equipment at home: Single point cane  OCCUPATION: Retired  PLOF: Independent and Independent with gait  PATIENT GOALS: get back to body combat, zumba and anything else I can get into   NEXT MD VISIT: September   OBJECTIVE:  Note: Objective measures were completed at Evaluation unless otherwise noted.  DIAGNOSTIC FINDINGS: Right knee arthroplasty. No periprosthetic fracture  or acute hardware complication. Anterior surgical staples. Subcutaneous edema and gas anteriorly.   IMPRESSION: Expected appearance after right knee arthroplasty.   COGNITION: Overall cognitive status: Within functional limits for tasks assessed     SENSATION: WFL  EDEMA:  Swelling in R knee  PALPATION: Increased swelling, some TTP, warm around scar   LOWER EXTREMITY ROM:  Active ROM Right eval Left eval  Hip flexion    Hip extension    Hip abduction    Hip adduction    Hip internal rotation    Hip external  rotation    Knee flexion 90   Knee extension 0   Ankle dorsiflexion    Ankle plantarflexion    Ankle inversion    Ankle eversion     (Blank rows = not tested)  LOWER EXTREMITY MMT: 4/5 in available ranges    FUNCTIONAL TESTS:  5 times sit to stand: 20s from chair  Timed up and go (TUG): 14s   GAIT: Distance walked: in clinic distances Assistive device utilized: None Level of assistance: Complete Independence Comments: antalgic gait, decreased step length and stance time on RLE                                                                                                                                 TREATMENT DATE:  05/20/24 EVAL   PATIENT EDUCATION:  Education details: POC, HEP, DVT education Person educated: Patient Education method: Explanation Education comprehension: verbalized understanding  HOME EXERCISE PROGRAM: LAQ Seated heel slides  Heel raises  Marching in place Squats   ASSESSMENT:  CLINICAL IMPRESSION: Patient is a 68 y.o. female who was seen today for physical therapy evaluation and treatment for R TKA. She is a returning patient who just had be L knee replaced in April. She is doing really well, walking without a device and reports no pain. Her knee is tight and stiff today into flexion but she has full extension. Her RLE is pretty red and swollen so we reviewed signs of DVTs. She saw her surgeon on 05/14/24 and he was very pleased with her progress. Patient will benefit from PT to return to her normal gym routine which included Body Combat, Zumba, and working with a personal training 5x a week.   OBJECTIVE IMPAIRMENTS: Abnormal gait, decreased balance, difficulty walking, decreased ROM, decreased strength, increased edema, and pain.   ACTIVITY LIMITATIONS: sitting, standing, squatting, stairs, and locomotion level  PARTICIPATION LIMITATIONS: cleaning, shopping, community activity, yard work, and gym   REHAB POTENTIAL: Good  CLINICAL DECISION  MAKING: Stable/uncomplicated  EVALUATION COMPLEXITY: Low   GOALS: Goals reviewed with patient? Yes   SHORT TERM GOALS: Target date: 07/01/25  Independent with initial HEP. Baseline:  Goal status: INITIAL  2.  Patient will demonstrate improved functional LE strength by completing 5x STS in <15 seconds from chair height.  Baseline: 20s  Goal status: INITIAL    LONG TERM GOALS: Target date: 08/12/24  Independent with advanced/ongoing HEP to improve outcomes and carryover.  Baseline:  Goal status: INITIAL  2.  Orrie Lascano Siefert will demonstrate R knee flexion to 120 deg to ascend/descend stairs. Baseline: 90d Goal status: INITIAL  3.  Velva E Goethe will be able to ambulate 500' with normal gait pattern to access community.  Baseline:  Goal status: INITIAL  4.  Keeana E Finklea will be able to ascend/descend stairs with reciprocal step pattern safely to access home and community.  Baseline: step to pattern Goal status: INITIAL  5.  Ninnie E Tingler will be able to return to her normal gym routine. (BodyCombat, Zumba, Yoga & working with Systems analyst)  Baseline: has not been back since first TKA Goal status: INITIAL  PLAN:  PT FREQUENCY: 2x/week  PT DURATION: 12 weeks  PLANNED INTERVENTIONS: 97110-Therapeutic exercises, 97530- Therapeutic activity, [id number]- Neuromuscular re-education, 97535- Self Care, 02859- Manual therapy, [phone]- Gait training, [phone] (1-2 muscles), [phone] (3+ muscles)- Dry Needling, Patient/Family education, Balance training, Stair training, Joint mobilization, Scar mobilization, and Cryotherapy  PLAN FOR NEXT SESSION: R knee PROM, functional tasks, R knee strengthening    Almetta Fam, PT 05/20/2024, 9:27 AM

## 2024-05-20 ENCOUNTER — Ambulatory Visit: Attending: Orthopaedic Surgery

## 2024-05-20 DIAGNOSIS — M25662 Stiffness of left knee, not elsewhere classified: Secondary | ICD-10-CM | POA: Insufficient documentation

## 2024-05-20 DIAGNOSIS — R600 Localized edema: Secondary | ICD-10-CM | POA: Insufficient documentation

## 2024-05-20 DIAGNOSIS — Z96651 Presence of right artificial knee joint: Secondary | ICD-10-CM | POA: Insufficient documentation

## 2024-05-20 DIAGNOSIS — M25661 Stiffness of right knee, not elsewhere classified: Secondary | ICD-10-CM | POA: Diagnosis not present

## 2024-05-20 DIAGNOSIS — M1711 Unilateral primary osteoarthritis, right knee: Secondary | ICD-10-CM | POA: Insufficient documentation

## 2024-05-20 DIAGNOSIS — R262 Difficulty in walking, not elsewhere classified: Secondary | ICD-10-CM | POA: Diagnosis not present

## 2024-05-20 DIAGNOSIS — M25562 Pain in left knee: Secondary | ICD-10-CM | POA: Diagnosis not present

## 2024-05-22 ENCOUNTER — Encounter: Payer: Self-pay | Admitting: Physical Therapy

## 2024-05-22 ENCOUNTER — Ambulatory Visit: Admitting: Physical Therapy

## 2024-05-22 DIAGNOSIS — Z96651 Presence of right artificial knee joint: Secondary | ICD-10-CM

## 2024-05-22 DIAGNOSIS — M25661 Stiffness of right knee, not elsewhere classified: Secondary | ICD-10-CM | POA: Diagnosis not present

## 2024-05-22 DIAGNOSIS — R600 Localized edema: Secondary | ICD-10-CM | POA: Diagnosis not present

## 2024-05-22 DIAGNOSIS — M25662 Stiffness of left knee, not elsewhere classified: Secondary | ICD-10-CM

## 2024-05-22 DIAGNOSIS — M1711 Unilateral primary osteoarthritis, right knee: Secondary | ICD-10-CM | POA: Diagnosis not present

## 2024-05-22 DIAGNOSIS — M25562 Pain in left knee: Secondary | ICD-10-CM | POA: Diagnosis not present

## 2024-05-22 DIAGNOSIS — R262 Difficulty in walking, not elsewhere classified: Secondary | ICD-10-CM

## 2024-05-22 NOTE — Therapy (Signed)
 OUTPATIENT PHYSICAL THERAPY LOWER EXTREMITY EVALUATION   Patient Name: Sara Nicholson MRN: [id number] DOB:[birth year], 68 y.o., female Today's Date: 05/22/2024  END OF SESSION:  PT End of Session - 05/22/24 0848     Visit Number 2    Date for PT Re-Evaluation 08/12/24    PT Start Time 0848    PT Stop Time 0930    PT Time Calculation (min) 42 min    Activity Tolerance Patient tolerated treatment well    Behavior During Therapy WFL for tasks assessed/performed          Past Medical History:  Diagnosis Date   Arthritis    Cancer (HCC)    squamous lesion right shoulder   Depression    Hypertension    Neuromuscular disorder (HCC)    Parkinson disease (HCC)    Pneumonia 1988   Past Surgical History:  Procedure Laterality Date   APPENDECTOMY     BREAST EXCISIONAL BIOPSY Left    benign cyst   CESAREAN SECTION     x1   COLONOSCOPY     TOTAL KNEE ARTHROPLASTY Left 01/17/2024   Procedure: ARTHROPLASTY, KNEE, TOTAL;  Surgeon: Vernetta Lonni GRADE, MD;  Location: [facility];  Service: Orthopedics;  Laterality: Left;   TOTAL KNEE ARTHROPLASTY Right 05/01/2024   Procedure: ARTHROPLASTY, KNEE, TOTAL;  Surgeon: Vernetta Lonni GRADE, MD;  Location: [facility];  Service: Orthopedics;  Laterality: Right;   Patient Active Problem List   Diagnosis Date Noted   Status post total right knee replacement 05/01/2024   Status post total left knee replacement 01/17/2024   Unilateral primary osteoarthritis, right knee 01/15/2023   Hyperlipidemia 11/08/2022   Class 2 severe obesity due to excess calories with serious comorbidity and body mass index (BMI) of 37.0 to 37.9 in adult (HCC) 11/08/2022   Parkinson's disease (HCC) 01/16/2021    PCP: Charmaine Bright  REFERRING PROVIDER: Lonni Vernetta  REFERRING DIAG:  [phone] (ICD-10-CM) - Status post total right knee replacement  M17.11 (ICD-10-CM) - Unilateral primary osteoarthritis, right knee    THERAPY DIAG:  S/P total knee  arthroplasty, right  Localized edema  Difficulty in walking, not elsewhere classified  Stiffness of right knee, not elsewhere classified  Stiffness of left knee, not elsewhere classified  Rationale for Evaluation and Treatment: Rehabilitation  ONSET DATE: 05/01/24  SUBJECTIVE:   SUBJECTIVE STATEMENT: This one was a little bit rougher than the other one   I don't really have pain. I am walking without a cane. I have been doing things it the house- sweeping, mopping, went down to the basement to the laundry. Doctor cleared me to drive.   05/14/24- First follow up with surgeon. Her extension is full and her flexion is to 91 degrees.  She is swollen but her calf is soft.  We have recommended compressive hose as well.    PERTINENT HISTORY: Parkinson's  L TKA 01/17/24 R TKA 05/01/24  PAIN:  Are you having pain? No  PRECAUTIONS: Knee  RED FLAGS: None   WEIGHT BEARING RESTRICTIONS: No  FALLS:  Has patient fallen in last 6 months? No  LIVING ENVIRONMENT: Lives with: lives with their spouse Lives in: House/apartment Stairs: Yes: Internal: 13 to get to basement steps; can reach both and External: 3 steps; none and but has a grab Has following equipment at home: Single point cane  OCCUPATION: Retired  PLOF: Independent and Independent with gait  PATIENT GOALS: get back to body combat, zumba and anything else I can get into  NEXT MD VISIT: September   OBJECTIVE:  Note: Objective measures were completed at Evaluation unless otherwise noted.  DIAGNOSTIC FINDINGS: Right knee arthroplasty. No periprosthetic fracture or acute hardware complication. Anterior surgical staples. Subcutaneous edema and gas anteriorly.   IMPRESSION: Expected appearance after right knee arthroplasty.   COGNITION: Overall cognitive status: Within functional limits for tasks assessed     SENSATION: WFL  EDEMA:  Swelling in R knee  PALPATION: Increased swelling, some TTP, warm around  scar   LOWER EXTREMITY ROM:  Active ROM Right eval Left eval  Hip flexion    Hip extension    Hip abduction    Hip adduction    Hip internal rotation    Hip external rotation    Knee flexion 90   Knee extension 0   Ankle dorsiflexion    Ankle plantarflexion    Ankle inversion    Ankle eversion     (Blank rows = not tested)  LOWER EXTREMITY MMT: 4/5 in available ranges    FUNCTIONAL TESTS:  5 times sit to stand: 20s from chair  Timed up and go (TUG): 14s   GAIT: Distance walked: in clinic distances Assistive device utilized: None Level of assistance: Complete Independence Comments: antalgic gait, decreased step length and stance time on RLE                                                                                                                                 TREATMENT DATE:  05/22/24 R knee PROM w/ end range holds Patellar mobs STM to surrounding knee structures  NuStep L 5 x 6 min LAQ 3lb RLE 2x12 HS curls RLE green 2x15 Sit to stand 2x10 4in forward and lateral step ups x10 each  05/20/24 EVAL   PATIENT EDUCATION:  Education details: POC, HEP, DVT education Person educated: Patient Education method: Explanation Education comprehension: verbalized understanding  HOME EXERCISE PROGRAM: LAQ Seated heel slides  Heel raises  Marching in place Squats   ASSESSMENT:  CLINICAL IMPRESSION: Patient is a 68 y.o. female who was seen today for physical therapy and treatment for R TKA. She is doing really well, walking without a device and reports no pain. Pt did however have pain with PROM. R knee with springy end feel, pain being the only limiting factor. All interventinos completed well. CGA needed with step ups.  Patient will benefit from PT to return to her normal gym routine which included Body Combat, Zumba, and working with a personal training 5x a week.   OBJECTIVE IMPAIRMENTS: Abnormal gait, decreased balance, difficulty walking, decreased ROM,  decreased strength, increased edema, and pain.   ACTIVITY LIMITATIONS: sitting, standing, squatting, stairs, and locomotion level  PARTICIPATION LIMITATIONS: cleaning, shopping, community activity, yard work, and gym   REHAB POTENTIAL: Good  CLINICAL DECISION MAKING: Stable/uncomplicated  EVALUATION COMPLEXITY: Low   GOALS: Goals reviewed with patient? Yes   SHORT TERM GOALS: Target date: 07/01/25  Independent  with initial HEP. Baseline:  Goal status: INITIAL  2.  Patient will demonstrate improved functional LE strength by completing 5x STS in <15 seconds from chair height.  Baseline: 20s  Goal status: INITIAL    LONG TERM GOALS: Target date: 08/12/24  Independent with advanced/ongoing HEP to improve outcomes and carryover.  Baseline:  Goal status: INITIAL  2.  Yanin Muhlestein Tilley will demonstrate R knee flexion to 120 deg to ascend/descend stairs. Baseline: 90d Goal status: INITIAL  3.  Turkessa E Ayad will be able to ambulate 500' with normal gait pattern to access community.  Baseline:  Goal status: INITIAL  4.  Nemesis E Mitch will be able to ascend/descend stairs with reciprocal step pattern safely to access home and community.  Baseline: step to pattern Goal status: INITIAL  5.  Rosaline E Trotter will be able to return to her normal gym routine. (BodyCombat, Zumba, Yoga & working with Systems analyst)  Baseline: has not been back since first TKA Goal status: INITIAL  PLAN:  PT FREQUENCY: 2x/week  PT DURATION: 12 weeks  PLANNED INTERVENTIONS: 97110-Therapeutic exercises, 97530- Therapeutic activity, 97112- Neuromuscular re-education, 97535- Self Care, 02859- Manual therapy, [phone]- Gait training, [phone] (1-2 muscles), 20561 (3+ muscles)- Dry Needling, Patient/Family education, Balance training, Stair training, Joint mobilization, Scar mobilization, and Cryotherapy  PLAN FOR NEXT SESSION: R knee PROM, functional tasks, R knee strengthening    Tanda KANDICE Sorrow,  PTA 05/22/2024, 8:49 AM

## 2024-05-25 ENCOUNTER — Encounter: Payer: Self-pay | Admitting: Physical Therapy

## 2024-05-25 ENCOUNTER — Ambulatory Visit: Admitting: Physical Therapy

## 2024-05-25 DIAGNOSIS — R600 Localized edema: Secondary | ICD-10-CM | POA: Diagnosis not present

## 2024-05-25 DIAGNOSIS — M25661 Stiffness of right knee, not elsewhere classified: Secondary | ICD-10-CM | POA: Diagnosis not present

## 2024-05-25 DIAGNOSIS — R262 Difficulty in walking, not elsewhere classified: Secondary | ICD-10-CM

## 2024-05-25 DIAGNOSIS — M25562 Pain in left knee: Secondary | ICD-10-CM | POA: Diagnosis not present

## 2024-05-25 DIAGNOSIS — M25662 Stiffness of left knee, not elsewhere classified: Secondary | ICD-10-CM | POA: Diagnosis not present

## 2024-05-25 DIAGNOSIS — M1711 Unilateral primary osteoarthritis, right knee: Secondary | ICD-10-CM | POA: Diagnosis not present

## 2024-05-25 DIAGNOSIS — Z96651 Presence of right artificial knee joint: Secondary | ICD-10-CM

## 2024-05-25 NOTE — Therapy (Signed)
 OUTPATIENT PHYSICAL THERAPY LOWER EXTREMITY TREATMENT   Patient Name: Sara Nicholson MRN: [id number] DOB:1956-06-09, 68 y.o., female Today's Date: 05/25/2024  END OF SESSION:  PT End of Session - 05/25/24 0934     Visit Number 3    Date for PT Re-Evaluation 08/12/24    PT Start Time 0934    PT Stop Time 1015    PT Time Calculation (min) 41 min    Activity Tolerance Patient tolerated treatment well    Behavior During Therapy [facility] for tasks assessed/performed          Past Medical History:  Diagnosis Date   Arthritis    Cancer (HCC)    squamous lesion right shoulder   Depression    Hypertension    Neuromuscular disorder (HCC)    Parkinson disease (HCC)    Pneumonia 1988   Past Surgical History:  Procedure Laterality Date   APPENDECTOMY     BREAST EXCISIONAL BIOPSY Left    benign cyst   CESAREAN SECTION     x1   COLONOSCOPY     TOTAL KNEE ARTHROPLASTY Left 01/17/2024   Procedure: ARTHROPLASTY, KNEE, TOTAL;  Surgeon: Vernetta Lonni GRADE, MD;  Location: [facility];  Service: Orthopedics;  Laterality: Left;   TOTAL KNEE ARTHROPLASTY Right 05/01/2024   Procedure: ARTHROPLASTY, KNEE, TOTAL;  Surgeon: Vernetta Lonni GRADE, MD;  Location: [facility];  Service: Orthopedics;  Laterality: Right;   Patient Active Problem List   Diagnosis Date Noted   Status post total right knee replacement 05/01/2024   Status post total left knee replacement 01/17/2024   Unilateral primary osteoarthritis, right knee 01/15/2023   Hyperlipidemia 11/08/2022   Class 2 severe obesity due to excess calories with serious comorbidity and body mass index (BMI) of 37.0 to 37.9 in adult (HCC) 11/08/2022   Parkinson's disease (HCC) 01/16/2021    PCP: Charmaine Bright  REFERRING PROVIDER: Lonni Vernetta  REFERRING DIAG:  [phone] (ICD-10-CM) - Status post total right knee replacement  M17.11 (ICD-10-CM) - Unilateral primary osteoarthritis, right knee    THERAPY DIAG:  S/P total knee  arthroplasty, right  Localized edema  Difficulty in walking, not elsewhere classified  Stiffness of right knee, not elsewhere classified  Rationale for Evaluation and Treatment: Rehabilitation  ONSET DATE: 05/01/24  SUBJECTIVE:   SUBJECTIVE STATEMENT: I feel good, stiff I been working on it  I don't really have pain. I am walking without a cane. I have been doing things it the house- sweeping, mopping, went down to the basement to the laundry. Doctor cleared me to drive.   05/14/24- First follow up with surgeon. Her extension is full and her flexion is to 91 degrees.  She is swollen but her calf is soft.  We have recommended compressive hose as well.    PERTINENT HISTORY: Parkinson's  L TKA 01/17/24 R TKA 05/01/24  PAIN:  Are you having pain? No  PRECAUTIONS: Knee  RED FLAGS: None   WEIGHT BEARING RESTRICTIONS: No  FALLS:  Has patient fallen in last 6 months? No  LIVING ENVIRONMENT: Lives with: lives with their spouse Lives in: House/apartment Stairs: Yes: Internal: 13 to get to basement steps; can reach both and External: 3 steps; none and but has a grab Has following equipment at home: Single point cane  OCCUPATION: Retired  PLOF: Independent and Independent with gait  PATIENT GOALS: get back to body combat, zumba and anything else I can get into   NEXT MD VISIT: September   OBJECTIVE:  Note: Objective  measures were completed at Evaluation unless otherwise noted.  DIAGNOSTIC FINDINGS: Right knee arthroplasty. No periprosthetic fracture or acute hardware complication. Anterior surgical staples. Subcutaneous edema and gas anteriorly.   IMPRESSION: Expected appearance after right knee arthroplasty.   COGNITION: Overall cognitive status: Within functional limits for tasks assessed     SENSATION: WFL  EDEMA:  Swelling in R knee  PALPATION: Increased swelling, some TTP, warm around scar   LOWER EXTREMITY ROM:  Active ROM Right eval  Hip  flexion   Hip extension   Hip abduction   Hip adduction   Hip internal rotation   Hip external rotation   Knee flexion 90  Knee extension 0  Ankle dorsiflexion   Ankle plantarflexion   Ankle inversion   Ankle eversion    (Blank rows = not tested)  LOWER EXTREMITY MMT: 4/5 in available ranges    FUNCTIONAL TESTS:  5 times sit to stand: 20s from chair  Timed up and go (TUG): 14s   GAIT: Distance walked: in clinic distances Assistive device utilized: None Level of assistance: Complete Independence Comments: antalgic gait, decreased step length and stance time on RLE                                                                                                                                 TREATMENT DATE:  05/25/24 R knee PROM w/ end range holds Patellar mobs STM to surrounding knee structures  NuStep L 5 x 4 min Slant board calf stretch 6in forward and lateral step ups x10 each Leg press 40lb 2x10, SL 20lb x10 each RLE LAQ 3lb 2x10 HS curls RLE green 2x15  05/22/24 R knee PROM w/ end range holds Patellar mobs STM to surrounding knee structures  NuStep L 5 x 6 min LAQ 3lb RLE 2x12 HS curls RLE green 2x15 Sit to stand 2x10 4in forward and lateral step ups x10 each  05/20/24 EVAL   PATIENT EDUCATION:  Education details: POC, HEP, DVT education Person educated: Patient Education method: Explanation Education comprehension: verbalized understanding  HOME EXERCISE PROGRAM: LAQ Seated heel slides  Heel raises  Marching in place Squats   ASSESSMENT:  CLINICAL IMPRESSION: Patient is a 68 y.o. female who was seen today for physical therapy and treatment for R TKA. Again doing really well, walking without a device and reports no pain. Pt did however have pain with PROM. Pain is the only limiting factor with PROM.  All interventinos completed well. LLE weakness with lateral step ups.  Patient will benefit from PT to return to her normal gym routine which  included Body Combat, Zumba, and working with a personal training 5x a week.   OBJECTIVE IMPAIRMENTS: Abnormal gait, decreased balance, difficulty walking, decreased ROM, decreased strength, increased edema, and pain.   ACTIVITY LIMITATIONS: sitting, standing, squatting, stairs, and locomotion level  PARTICIPATION LIMITATIONS: cleaning, shopping, community activity, yard work, and gym   REHAB POTENTIAL: Good  CLINICAL  DECISION MAKING: Stable/uncomplicated  EVALUATION COMPLEXITY: Low   GOALS: Goals reviewed with patient? Yes   SHORT TERM GOALS: Target date: 07/01/25  Independent with initial HEP. Baseline:  Goal status: INITIAL  2.  Patient will demonstrate improved functional LE strength by completing 5x STS in <15 seconds from chair height.  Baseline: 20s  Goal status: INITIAL    LONG TERM GOALS: Target date: 08/12/24  Independent with advanced/ongoing HEP to improve outcomes and carryover.  Baseline:  Goal status: INITIAL  2.  Dlisa Barnwell Nghiem will demonstrate R knee flexion to 120 deg to ascend/descend stairs. Baseline: 90d Goal status: INITIAL  3.  Medrith E Devore will be able to ambulate 500' with normal gait pattern to access community.  Baseline:  Goal status: INITIAL  4.  Zahra E Liebman will be able to ascend/descend stairs with reciprocal step pattern safely to access home and community.  Baseline: step to pattern Goal status: INITIAL  5.  Antha E Kleiber will be able to return to her normal gym routine. (BodyCombat, Zumba, Yoga & working with Systems analyst)  Baseline: has not been back since first TKA Goal status: INITIAL  PLAN:  PT FREQUENCY: 2x/week  PT DURATION: 12 weeks  PLANNED INTERVENTIONS: 97110-Therapeutic exercises, 97530- Therapeutic activity, 97112- Neuromuscular re-education, 97535- Self Care, 02859- Manual therapy, [phone]- Gait training, [phone] (1-2 muscles), 20561 (3+ muscles)- Dry Needling, Patient/Family education, Balance training,  Stair training, Joint mobilization, Scar mobilization, and Cryotherapy  PLAN FOR NEXT SESSION: R knee PROM, functional tasks, R knee strengthening    Tanda KANDICE Sorrow, PTA 05/25/2024, 9:35 AM

## 2024-05-28 ENCOUNTER — Other Ambulatory Visit: Payer: Self-pay | Admitting: Neurology

## 2024-05-28 ENCOUNTER — Ambulatory Visit: Admitting: Physical Therapy

## 2024-05-28 ENCOUNTER — Encounter: Payer: Self-pay | Admitting: Physical Therapy

## 2024-05-28 DIAGNOSIS — M25562 Pain in left knee: Secondary | ICD-10-CM | POA: Diagnosis not present

## 2024-05-28 DIAGNOSIS — M25661 Stiffness of right knee, not elsewhere classified: Secondary | ICD-10-CM | POA: Diagnosis not present

## 2024-05-28 DIAGNOSIS — M1711 Unilateral primary osteoarthritis, right knee: Secondary | ICD-10-CM | POA: Diagnosis not present

## 2024-05-28 DIAGNOSIS — Z96651 Presence of right artificial knee joint: Secondary | ICD-10-CM | POA: Diagnosis not present

## 2024-05-28 DIAGNOSIS — R262 Difficulty in walking, not elsewhere classified: Secondary | ICD-10-CM | POA: Diagnosis not present

## 2024-05-28 DIAGNOSIS — R600 Localized edema: Secondary | ICD-10-CM

## 2024-05-28 DIAGNOSIS — M25662 Stiffness of left knee, not elsewhere classified: Secondary | ICD-10-CM | POA: Diagnosis not present

## 2024-05-28 NOTE — Therapy (Signed)
 OUTPATIENT PHYSICAL THERAPY LOWER EXTREMITY TREATMENT   Patient Name: Sara Nicholson MRN: [id number] DOB:21-Mar-1956, 68 y.o., female Today's Date: 05/28/2024  END OF SESSION:  PT End of Session - 05/28/24 0932     Visit Number 4    Date for PT Re-Evaluation 08/12/24    PT Start Time 0930    PT Stop Time 1015    PT Time Calculation (min) 45 min    Activity Tolerance Patient tolerated treatment well    Behavior During Therapy WFL for tasks assessed/performed          Past Medical History:  Diagnosis Date   Arthritis    Cancer (HCC)    squamous lesion right shoulder   Depression    Hypertension    Neuromuscular disorder (HCC)    Parkinson disease (HCC)    Pneumonia 1988   Past Surgical History:  Procedure Laterality Date   APPENDECTOMY     BREAST EXCISIONAL BIOPSY Left    benign cyst   CESAREAN SECTION     x1   COLONOSCOPY     TOTAL KNEE ARTHROPLASTY Left 01/17/2024   Procedure: ARTHROPLASTY, KNEE, TOTAL;  Surgeon: Vernetta Lonni GRADE, MD;  Location: [facility];  Service: Orthopedics;  Laterality: Left;   TOTAL KNEE ARTHROPLASTY Right 05/01/2024   Procedure: ARTHROPLASTY, KNEE, TOTAL;  Surgeon: Vernetta Lonni GRADE, MD;  Location: [facility];  Service: Orthopedics;  Laterality: Right;   Patient Active Problem List   Diagnosis Date Noted   Status post total right knee replacement 05/01/2024   Status post total left knee replacement 01/17/2024   Unilateral primary osteoarthritis, right knee 01/15/2023   Hyperlipidemia 11/08/2022   Class 2 severe obesity due to excess calories with serious comorbidity and body mass index (BMI) of 37.0 to 37.9 in adult (HCC) 11/08/2022   Parkinson's disease (HCC) 01/16/2021    PCP: Charmaine Bright  REFERRING PROVIDER: Lonni Vernetta  REFERRING DIAG:  [phone] (ICD-10-CM) - Status post total right knee replacement  M17.11 (ICD-10-CM) - Unilateral primary osteoarthritis, right knee    THERAPY DIAG:  S/P total knee  arthroplasty, right  Difficulty in walking, not elsewhere classified  Stiffness of right knee, not elsewhere classified  Localized edema  Rationale for Evaluation and Treatment: Rehabilitation  ONSET DATE: 05/01/24  SUBJECTIVE:   SUBJECTIVE STATEMENT: Good  I don't really have pain. I am walking without a cane. I have been doing things it the house- sweeping, mopping, went down to the basement to the laundry. Doctor cleared me to drive.   05/14/24- First follow up with surgeon. Her extension is full and her flexion is to 91 degrees.  She is swollen but her calf is soft.  We have recommended compressive hose as well.    PERTINENT HISTORY: Parkinson's  L TKA 01/17/24 R TKA 05/01/24  PAIN:  Are you having pain? No  PRECAUTIONS: Knee  RED FLAGS: None   WEIGHT BEARING RESTRICTIONS: No  FALLS:  Has patient fallen in last 6 months? No  LIVING ENVIRONMENT: Lives with: lives with their spouse Lives in: House/apartment Stairs: Yes: Internal: 13 to get to basement steps; can reach both and External: 3 steps; none and but has a grab Has following equipment at home: Single point cane  OCCUPATION: Retired  PLOF: Independent and Independent with gait  PATIENT GOALS: get back to body combat, zumba and anything else I can get into   NEXT MD VISIT: September   OBJECTIVE:  Note: Objective measures were completed at Evaluation unless otherwise noted.  DIAGNOSTIC FINDINGS: Right knee arthroplasty. No periprosthetic fracture or acute hardware complication. Anterior surgical staples. Subcutaneous edema and gas anteriorly.   IMPRESSION: Expected appearance after right knee arthroplasty.   COGNITION: Overall cognitive status: Within functional limits for tasks assessed     SENSATION: WFL  EDEMA:  Swelling in R knee  PALPATION: Increased swelling, some TTP, warm around scar   LOWER EXTREMITY ROM:  Active ROM Right eval Right 05/28/24  Hip flexion    Hip extension     Hip abduction    Hip adduction    Hip internal rotation    Hip external rotation    Knee flexion 90 95  Knee extension 0  1  Ankle dorsiflexion    Ankle plantarflexion    Ankle inversion    Ankle eversion     (Blank rows = not tested)  LOWER EXTREMITY MMT: 4/5 in available ranges    FUNCTIONAL TESTS:  5 times sit to stand: 20s from chair  Timed up and go (TUG): 14s   GAIT: Distance walked: in clinic distances Assistive device utilized: None Level of assistance: Complete Independence Comments: antalgic gait, decreased step length and stance time on RLE                                                                                                                                 TREATMENT DATE:  05/28/24 NuStep L5 x 6 min R knee PROM w/ end range holds Patellar mobs STM to surrounding knee structures  GOALS  5x sit to stands 12.13   Stairs HS curls 20lb 2x10  Leg Ext 5lb 2x10 Leg press 40lb 2x10, SL 20lb x10 each RLE LAQ 3lb 2x10 HS curls RLE blue 2x15   05/25/24 R knee PROM w/ end range holds Patellar mobs STM to surrounding knee structures  NuStep L 5 x 4 min Slant board calf stretch 6in forward and lateral step ups x10 each Leg press 40lb 2x10, SL 20lb x10 each RLE LAQ 3lb 2x10 HS curls RLE green 2x15  05/22/24 R knee PROM w/ end range holds Patellar mobs STM to surrounding knee structures  NuStep L 5 x 6 min LAQ 3lb RLE 2x12 HS curls RLE green 2x15 Sit to stand 2x10 4in forward and lateral step ups x10 each  05/20/24 EVAL   PATIENT EDUCATION:  Education details: POC, HEP, DVT education Person educated: Patient Education method: Explanation Education comprehension: verbalized understanding  HOME EXERCISE PROGRAM: LAQ Seated heel slides  Heel raises  Marching in place Squats   ASSESSMENT:  CLINICAL IMPRESSION: Patient is a 68 y.o. female who was seen today for physical therapy and treatment for R TKA. She has progressed meeting STGs.  She has progressed towards some LTGs.  Slight improvement made with AROM, but passive is really good. Progressed to stairs maintaining alternating pattern some RLE eccentric load weakness. Again doing really well, walking without a device and reports no pain.  All interventinos completed well.  Patient will benefit from PT to return to her normal gym routine which included Body Combat, Zumba, and working with a personal training 5x a week.   OBJECTIVE IMPAIRMENTS: Abnormal gait, decreased balance, difficulty walking, decreased ROM, decreased strength, increased edema, and pain.   ACTIVITY LIMITATIONS: sitting, standing, squatting, stairs, and locomotion level  PARTICIPATION LIMITATIONS: cleaning, shopping, community activity, yard work, and gym   REHAB POTENTIAL: Good  CLINICAL DECISION MAKING: Stable/uncomplicated  EVALUATION COMPLEXITY: Low   GOALS: Goals reviewed with patient? Yes   SHORT TERM GOALS: Target date: 07/01/25  Independent with initial HEP. Baseline:  Goal status: Met  05/28/24  2.  Patient will demonstrate improved functional LE strength by completing 5x STS in <15 seconds from chair height.  Baseline: 20s  Goal status: Mety 05/28/24    LONG TERM GOALS: Target date: 08/12/24  Independent with advanced/ongoing HEP to improve outcomes and carryover.  Baseline:  Goal status: INITIAL  2.  Sara Nicholson will demonstrate R knee flexion to 120 deg to ascend/descend stairs. Baseline: 90d Goal status: Progressing 05/28/24  3.  Sara Nicholson will be able to ambulate 500' with normal gait pattern to access community.  Baseline:  Goal status: INITIAL  4.  Sara Nicholson will be able to ascend/descend stairs with reciprocal step pattern safely to access home and community.  Baseline: step to pattern Goal status: Progressing 05/28/24  5.  Sara Nicholson will be able to return to her normal gym routine. (BodyCombat, Zumba, Yoga & working with Systems analyst)   Baseline: has not been back since first TKA Goal status: ongoing, but has been to the gym on her own 05/28/24  PLAN:  PT FREQUENCY: 2x/week  PT DURATION: 12 weeks  PLANNED INTERVENTIONS: 97110-Therapeutic exercises, 97530- Therapeutic activity, 97112- Neuromuscular re-education, 97535- Self Care, [id number]- Manual therapy, 97116- Gait training, [phone] (1-2 muscles), 20561 (3+ muscles)- Dry Needling, Patient/Family education, Balance training, Stair training, Joint mobilization, Scar mobilization, and Cryotherapy  PLAN FOR NEXT SESSION: R knee PROM, functional tasks, R knee strengthening    Sara Nicholson, PTA 05/28/2024, 9:33 AM

## 2024-06-01 ENCOUNTER — Ambulatory Visit: Admitting: Physical Therapy

## 2024-06-02 ENCOUNTER — Ambulatory Visit: Admitting: Physical Therapy

## 2024-06-02 ENCOUNTER — Encounter: Payer: Self-pay | Admitting: Physical Therapy

## 2024-06-02 DIAGNOSIS — M25661 Stiffness of right knee, not elsewhere classified: Secondary | ICD-10-CM

## 2024-06-02 DIAGNOSIS — M25562 Pain in left knee: Secondary | ICD-10-CM | POA: Diagnosis not present

## 2024-06-02 DIAGNOSIS — R262 Difficulty in walking, not elsewhere classified: Secondary | ICD-10-CM | POA: Diagnosis not present

## 2024-06-02 DIAGNOSIS — R600 Localized edema: Secondary | ICD-10-CM | POA: Diagnosis not present

## 2024-06-02 DIAGNOSIS — M1711 Unilateral primary osteoarthritis, right knee: Secondary | ICD-10-CM | POA: Diagnosis not present

## 2024-06-02 DIAGNOSIS — M25662 Stiffness of left knee, not elsewhere classified: Secondary | ICD-10-CM | POA: Diagnosis not present

## 2024-06-02 DIAGNOSIS — Z96651 Presence of right artificial knee joint: Secondary | ICD-10-CM

## 2024-06-02 NOTE — Therapy (Signed)
 OUTPATIENT PHYSICAL THERAPY LOWER EXTREMITY TREATMENT   Patient Name: Sara Nicholson MRN: [id number] DOB:[birth year], 68 y.o., female Today's Date: 06/02/2024  END OF SESSION:  PT End of Session - 06/02/24 1549     Visit Number 5    Date for PT Re-Evaluation 08/12/24    PT Start Time 1550    PT Stop Time 1635    PT Time Calculation (min) 45 min    Activity Tolerance Patient tolerated treatment well    Behavior During Therapy WFL for tasks assessed/performed          Past Medical History:  Diagnosis Date   Arthritis    Cancer (HCC)    squamous lesion right shoulder   Depression    Hypertension    Neuromuscular disorder (HCC)    Parkinson disease (HCC)    Pneumonia 1988   Past Surgical History:  Procedure Laterality Date   APPENDECTOMY     BREAST EXCISIONAL BIOPSY Left    benign cyst   CESAREAN SECTION     x1   COLONOSCOPY     TOTAL KNEE ARTHROPLASTY Left 01/17/2024   Procedure: ARTHROPLASTY, KNEE, TOTAL;  Surgeon: Vernetta Lonni GRADE, MD;  Location: [facility];  Service: Orthopedics;  Laterality: Left;   TOTAL KNEE ARTHROPLASTY Right 05/01/2024   Procedure: ARTHROPLASTY, KNEE, TOTAL;  Surgeon: Vernetta Lonni GRADE, MD;  Location: [facility];  Service: Orthopedics;  Laterality: Right;   Patient Active Problem List   Diagnosis Date Noted   Status post total right knee replacement 05/01/2024   Status post total left knee replacement 01/17/2024   Unilateral primary osteoarthritis, right knee 01/15/2023   Hyperlipidemia 11/08/2022   Class 2 severe obesity due to excess calories with serious comorbidity and body mass index (BMI) of 37.0 to 37.9 in adult [facility]) 11/08/2022   Parkinson's disease (HCC) 01/16/2021    PCP: Charmaine Bright  REFERRING PROVIDER: Lonni Vernetta  REFERRING DIAG:  [phone] (ICD-10-CM) - Status post total right knee replacement  M17.11 (ICD-10-CM) - Unilateral primary osteoarthritis, right knee    THERAPY DIAG:  Difficulty in walking,  not elsewhere classified  Stiffness of right knee, not elsewhere classified  S/P total knee arthroplasty, right  Localized edema  Rationale for Evaluation and Treatment: Rehabilitation  ONSET DATE: 05/01/24  SUBJECTIVE:   SUBJECTIVE STATEMENT: Everything is good  I don't really have pain. I am walking without a cane. I have been doing things it the house- sweeping, mopping, went down to the basement to the laundry. Doctor cleared me to drive.   05/14/24- First follow up with surgeon. Her extension is full and her flexion is to 91 degrees.  She is swollen but her calf is soft.  We have recommended compressive hose as well.    PERTINENT HISTORY: Parkinson's  L TKA 01/17/24 R TKA 05/01/24  PAIN:  Are you having pain? No  PRECAUTIONS: Knee  RED FLAGS: None   WEIGHT BEARING RESTRICTIONS: No  FALLS:  Has patient fallen in last 6 months? No  LIVING ENVIRONMENT: Lives with: lives with their spouse Lives in: House/apartment Stairs: Yes: Internal: 13 to get to basement steps; can reach both and External: 3 steps; none and but has a grab Has following equipment at home: Single point cane  OCCUPATION: Retired  PLOF: Independent and Independent with gait  PATIENT GOALS: get back to body combat, zumba and anything else I can get into   NEXT MD VISIT: September   OBJECTIVE:  Note: Objective measures were completed at Evaluation unless  otherwise noted.  DIAGNOSTIC FINDINGS: Right knee arthroplasty. No periprosthetic fracture or acute hardware complication. Anterior surgical staples. Subcutaneous edema and gas anteriorly.   IMPRESSION: Expected appearance after right knee arthroplasty.   COGNITION: Overall cognitive status: Within functional limits for tasks assessed     SENSATION: WFL  EDEMA:  Swelling in R knee  PALPATION: Increased swelling, some TTP, warm around scar   LOWER EXTREMITY ROM:  Active ROM Right eval Right 05/28/24  Hip flexion    Hip  extension    Hip abduction    Hip adduction    Hip internal rotation    Hip external rotation    Knee flexion 90 95  Knee extension 0  1  Ankle dorsiflexion    Ankle plantarflexion    Ankle inversion    Ankle eversion     (Blank rows = not tested)  LOWER EXTREMITY MMT: 4/5 in available ranges    FUNCTIONAL TESTS:  5 times sit to stand: 20s from chair  Timed up and go (TUG): 14s   GAIT: Distance walked: in clinic distances Assistive device utilized: None Level of assistance: Complete Independence Comments: antalgic gait, decreased step length and stance time on RLE                                                                                                                                 TREATMENT DATE:  06/02/24 NuStep L4 x 6 min LE only Leg press 50lb 2x12 6in forward & lateral step ups x 10 each 4in lateral step ups LLE 2x10  R knee PROM w/ end range holds Patellar mobs STM to surrounding knee structures  Sit to stand holding yellow ball 2x10 HS curls 25lb 2x10  Leg Ext 10lb 2x10  05/28/24 NuStep L5 x 6 min R knee PROM w/ end range holds Patellar mobs STM to surrounding knee structures  GOALS  5x sit to stands 12.13   Stairs HS curls 20lb 2x10  Leg Ext 5lb 2x10 Leg press 40lb 2x10, SL 20lb x10 each RLE LAQ 3lb 2x10 HS curls RLE blue 2x15   05/25/24 R knee PROM w/ end range holds Patellar mobs STM to surrounding knee structures  NuStep L 5 x 4 min Slant board calf stretch 6in forward and lateral step ups x10 each Leg press 40lb 2x10, SL 20lb x10 each RLE LAQ 3lb 2x10 HS curls RLE green 2x15  05/22/24 R knee PROM w/ end range holds Patellar mobs STM to surrounding knee structures  NuStep L 5 x 6 min LAQ 3lb RLE 2x12 HS curls RLE green 2x15 Sit to stand 2x10 4in forward and lateral step ups x10 each  05/20/24 EVAL   PATIENT EDUCATION:  Education details: POC, HEP, DVT education Person educated: Patient Education method:  Explanation Education comprehension: verbalized understanding  HOME EXERCISE PROGRAM: LAQ Seated heel slides  Heel raises  Marching in place Squats   ASSESSMENT:  CLINICAL IMPRESSION: Patient is  a 68 y.o. female who was seen today for physical therapy and treatment for R TKA.  Again doing really well, walking without a device and reports no pain. Increase resistance and or reps tolerated with machine level interventions.  LLE weakness with lateral step ups causing some compensation. All interventinos completed well.   Patient will benefit from PT to return to her normal gym routine which included Body Combat, Zumba, and working with a personal training 5x a week.   OBJECTIVE IMPAIRMENTS: Abnormal gait, decreased balance, difficulty walking, decreased ROM, decreased strength, increased edema, and pain.   ACTIVITY LIMITATIONS: sitting, standing, squatting, stairs, and locomotion level  PARTICIPATION LIMITATIONS: cleaning, shopping, community activity, yard work, and gym   REHAB POTENTIAL: Good  CLINICAL DECISION MAKING: Stable/uncomplicated  EVALUATION COMPLEXITY: Low   GOALS: Goals reviewed with patient? Yes   SHORT TERM GOALS: Target date: 07/01/25  Independent with initial HEP. Baseline:  Goal status: Met  05/28/24  2.  Patient will demonstrate improved functional LE strength by completing 5x STS in <15 seconds from chair height.  Baseline: 20s  Goal status: Mety 05/28/24    LONG TERM GOALS: Target date: 08/12/24  Independent with advanced/ongoing HEP to improve outcomes and carryover.  Baseline:  Goal status: INITIAL  2.  Sara Nicholson will demonstrate R knee flexion to 120 deg to ascend/descend stairs. Baseline: 90d Goal status: Progressing 05/28/24  3.  Sara Nicholson will be able to ambulate 500' with normal gait pattern to access community.  Baseline:  Goal status: INITIAL  4.  Sara Nicholson will be able to ascend/descend stairs with reciprocal step  pattern safely to access home and community.  Baseline: step to pattern Goal status: Progressing 05/28/24  5.  Sara Nicholson will be able to return to her normal gym routine. (BodyCombat, Zumba, Yoga & working with Systems analyst)  Baseline: has not been back since first TKA Goal status: ongoing, but has been to the gym on her own 05/28/24  PLAN:  PT FREQUENCY: 2x/week  PT DURATION: 12 weeks  PLANNED INTERVENTIONS: 97110-Therapeutic exercises, 97530- Therapeutic activity, 97112- Neuromuscular re-education, 97535- Self Care, 02859- Manual therapy, 97116- Gait training, [phone] (1-2 muscles), 20561 (3+ muscles)- Dry Needling, Patient/Family education, Balance training, Stair training, Joint mobilization, Scar mobilization, and Cryotherapy  PLAN FOR NEXT SESSION: R knee PROM, functional tasks, R knee strengthening    Sara Nicholson, PTA 06/02/2024, 3:50 PM

## 2024-06-04 ENCOUNTER — Encounter: Payer: Self-pay | Admitting: Physical Therapy

## 2024-06-04 ENCOUNTER — Ambulatory Visit: Admitting: Physical Therapy

## 2024-06-04 DIAGNOSIS — R600 Localized edema: Secondary | ICD-10-CM

## 2024-06-04 DIAGNOSIS — M25661 Stiffness of right knee, not elsewhere classified: Secondary | ICD-10-CM

## 2024-06-04 DIAGNOSIS — M25662 Stiffness of left knee, not elsewhere classified: Secondary | ICD-10-CM

## 2024-06-04 DIAGNOSIS — Z96651 Presence of right artificial knee joint: Secondary | ICD-10-CM

## 2024-06-04 DIAGNOSIS — M25562 Pain in left knee: Secondary | ICD-10-CM | POA: Diagnosis not present

## 2024-06-04 DIAGNOSIS — R262 Difficulty in walking, not elsewhere classified: Secondary | ICD-10-CM | POA: Diagnosis not present

## 2024-06-04 DIAGNOSIS — M1711 Unilateral primary osteoarthritis, right knee: Secondary | ICD-10-CM | POA: Diagnosis not present

## 2024-06-04 NOTE — Therapy (Signed)
 OUTPATIENT PHYSICAL THERAPY LOWER EXTREMITY TREATMENT   Patient Name: Sara Nicholson MRN: [id number] DOB:05/22/1956, 68 y.o., female Today's Date: 06/04/2024  END OF SESSION:  PT End of Session - 06/04/24 0855     Visit Number 6    Date for PT Re-Evaluation 08/12/24    PT Start Time 0845    PT Stop Time 0930    PT Time Calculation (min) 45 min    Activity Tolerance Patient tolerated treatment well    Behavior During Therapy [facility] for tasks assessed/performed          Past Medical History:  Diagnosis Date   Arthritis    Cancer (HCC)    squamous lesion right shoulder   Depression    Hypertension    Neuromuscular disorder (HCC)    Parkinson disease (HCC)    Pneumonia 1988   Past Surgical History:  Procedure Laterality Date   APPENDECTOMY     BREAST EXCISIONAL BIOPSY Left    benign cyst   CESAREAN SECTION     x1   COLONOSCOPY     TOTAL KNEE ARTHROPLASTY Left 01/17/2024   Procedure: ARTHROPLASTY, KNEE, TOTAL;  Surgeon: Vernetta Lonni GRADE, MD;  Location: [facility];  Service: Orthopedics;  Laterality: Left;   TOTAL KNEE ARTHROPLASTY Right 05/01/2024   Procedure: ARTHROPLASTY, KNEE, TOTAL;  Surgeon: Vernetta Lonni GRADE, MD;  Location: [facility];  Service: Orthopedics;  Laterality: Right;   Patient Active Problem List   Diagnosis Date Noted   Status post total right knee replacement 05/01/2024   Status post total left knee replacement 01/17/2024   Unilateral primary osteoarthritis, right knee 01/15/2023   Hyperlipidemia 11/08/2022   Class 2 severe obesity due to excess calories with serious comorbidity and body mass index (BMI) of 37.0 to 37.9 in adult (HCC) 11/08/2022   Parkinson's disease (HCC) 01/16/2021    PCP: Charmaine Bright  REFERRING PROVIDER: Lonni Vernetta  REFERRING DIAG:  [phone] (ICD-10-CM) - Status post total right knee replacement  M17.11 (ICD-10-CM) - Unilateral primary osteoarthritis, right knee    THERAPY DIAG:  Difficulty in walking,  not elsewhere classified  Stiffness of right knee, not elsewhere classified  S/P total knee arthroplasty, right  Localized edema  Stiffness of left knee, not elsewhere classified  Acute pain of left knee  Rationale for Evaluation and Treatment: Rehabilitation  ONSET DATE: 05/01/24  SUBJECTIVE:   SUBJECTIVE STATEMENT: I feel great  I don't really have pain. I am walking without a cane. I have been doing things it the house- sweeping, mopping, went down to the basement to the laundry. Doctor cleared me to drive.   05/14/24- First follow up with surgeon. Her extension is full and her flexion is to 91 degrees.  She is swollen but her calf is soft.  We have recommended compressive hose as well.    PERTINENT HISTORY: Parkinson's  L TKA 01/17/24 R TKA 05/01/24  PAIN:  Are you having pain? No  PRECAUTIONS: Knee  RED FLAGS: None   WEIGHT BEARING RESTRICTIONS: No  FALLS:  Has patient fallen in last 6 months? No  LIVING ENVIRONMENT: Lives with: lives with their spouse Lives in: House/apartment Stairs: Yes: Internal: 13 to get to basement steps; can reach both and External: 3 steps; none and but has a grab Has following equipment at home: Single point cane  OCCUPATION: Retired  PLOF: Independent and Independent with gait  PATIENT GOALS: get back to body combat, zumba and anything else I can get into   NEXT MD  VISIT: September   OBJECTIVE:  Note: Objective measures were completed at Evaluation unless otherwise noted.  DIAGNOSTIC FINDINGS: Right knee arthroplasty. No periprosthetic fracture or acute hardware complication. Anterior surgical staples. Subcutaneous edema and gas anteriorly.   IMPRESSION: Expected appearance after right knee arthroplasty.   COGNITION: Overall cognitive status: Within functional limits for tasks assessed     SENSATION: WFL  EDEMA:  Swelling in R knee  PALPATION: Increased swelling, some TTP, warm around scar   LOWER EXTREMITY  ROM:  Active ROM Right eval Right 05/28/24 Right 06/04/24  Hip flexion     Hip extension     Hip abduction     Hip adduction     Hip internal rotation     Hip external rotation     Knee flexion 90 95 98  Knee extension 0  1 1  Ankle dorsiflexion     Ankle plantarflexion     Ankle inversion     Ankle eversion      (Blank rows = not tested)  LOWER EXTREMITY MMT: 4/5 in available ranges    FUNCTIONAL TESTS:  5 times sit to stand: 20s from chair  Timed up and go (TUG): 14s   GAIT: Distance walked: in clinic distances Assistive device utilized: None Level of assistance: Complete Independence Comments: antalgic gait, decreased step length and stance time on RLE                                                                                                                                 TREATMENT DATE:  06/04/24 NuStep L5 x 6 min LE only  Slant board calf stretch 6in step ups forward & Lateral step ups x10 R knee PROM w/ end range holds Patellar mobs STM to surrounding knee structures  Goals  Sit to stands holding yellow ball 2x15 HS curls 25lb 2x10  Leg Ext 10lb 2x10, RLE 5lb 2x5   06/02/24 NuStep L4 x 6 min LE only Leg press 50lb 2x12 6in forward & lateral step ups x 10 each 4in lateral step ups LLE 2x10  R knee PROM w/ end range holds Patellar mobs STM to surrounding knee structures  Sit to stand holding yellow ball 2x10 HS curls 25lb 2x10  Leg Ext 10lb 2x10  05/28/24 NuStep L5 x 6 min R knee PROM w/ end range holds Patellar mobs STM to surrounding knee structures  GOALS  5x sit to stands 12.13   Stairs HS curls 20lb 2x10  Leg Ext 5lb 2x10 Leg press 40lb 2x10, SL 20lb x10 each RLE LAQ 3lb 2x10 HS curls RLE blue 2x15   05/25/24 R knee PROM w/ end range holds Patellar mobs STM to surrounding knee structures  NuStep L 5 x 4 min Slant board calf stretch 6in forward and lateral step ups x10 each Leg press 40lb 2x10, SL 20lb x10 each RLE LAQ 3lb  2x10 HS curls RLE green 2x15  05/22/24 R knee  PROM w/ end range holds Patellar mobs STM to surrounding knee structures  NuStep L 5 x 6 min LAQ 3lb RLE 2x12 HS curls RLE green 2x15 Sit to stand 2x10 4in forward and lateral step ups x10 each  05/20/24 EVAL   PATIENT EDUCATION:  Education details: POC, HEP, DVT education Person educated: Patient Education method: Explanation Education comprehension: verbalized understanding  HOME EXERCISE PROGRAM: LAQ Seated heel slides  Heel raises  Marching in place Squats   ASSESSMENT:  CLINICAL IMPRESSION: Patient is a 68 y.o. female who was seen today for physical therapy and treatment for R TKA.  Again doing really well, walking without a device and reports no pain. She has progressed some increasing her R knee AROM.   LLE weakness with lateral step ups causing some compensation remains.  All interventinos completed well.   Patient will benefit from PT to return to her normal gym routine which included Body Combat, Zumba, and working with a personal training 5x a week.   OBJECTIVE IMPAIRMENTS: Abnormal gait, decreased balance, difficulty walking, decreased ROM, decreased strength, increased edema, and pain.   ACTIVITY LIMITATIONS: sitting, standing, squatting, stairs, and locomotion level  PARTICIPATION LIMITATIONS: cleaning, shopping, community activity, yard work, and gym   REHAB POTENTIAL: Good  CLINICAL DECISION MAKING: Stable/uncomplicated  EVALUATION COMPLEXITY: Low   GOALS: Goals reviewed with patient? Yes   SHORT TERM GOALS: Target date: 07/01/25  Independent with initial HEP. Baseline:  Goal status: Met  05/28/24  2.  Patient will demonstrate improved functional LE strength by completing 5x STS in <15 seconds from chair height.  Baseline: 20s  Goal status: Mety 05/28/24    LONG TERM GOALS: Target date: 08/12/24  Independent with advanced/ongoing HEP to improve outcomes and carryover.  Baseline:  Goal status:  INITIAL  2.  Lakita Sahlin Dresch will demonstrate R knee flexion to 120 deg to ascend/descend stairs. Baseline: 90d Goal status: Progressing 06/04/24  3.  Irena E Greenhaw will be able to ambulate 500' with normal gait pattern to access community.  Baseline:  Goal status: Partly Met 06/04/24  4.  Ayline E Vivero will be able to ascend/descend stairs with reciprocal step pattern safely to access home and community.  Baseline: step to pattern Goal status: Progressing 05/28/24  5.  Sharnelle E Knudtson will be able to return to her normal gym routine. (BodyCombat, Zumba, Yoga & working with Systems analyst)  Baseline: has not been back since first TKA Goal status: ongoing, but has been to the gym on her own 06/04/24  PLAN:  PT FREQUENCY: 2x/week  PT DURATION: 12 weeks  PLANNED INTERVENTIONS: 97110-Therapeutic exercises, 97530- Therapeutic activity, 97112- Neuromuscular re-education, 97535- Self Care, [id number]- Manual therapy, 97116- Gait training, [phone] (1-2 muscles), 20561 (3+ muscles)- Dry Needling, Patient/Family education, Balance training, Stair training, Joint mobilization, Scar mobilization, and Cryotherapy  PLAN FOR NEXT SESSION: R knee PROM, functional tasks, R knee strengthening    Tanda KANDICE Sorrow, PTA 06/04/2024, 8:55 AM

## 2024-06-07 DIAGNOSIS — G20A1 Parkinson's disease without dyskinesia, without mention of fluctuations: Secondary | ICD-10-CM | POA: Diagnosis not present

## 2024-06-07 DIAGNOSIS — M1712 Unilateral primary osteoarthritis, left knee: Secondary | ICD-10-CM | POA: Diagnosis not present

## 2024-06-10 ENCOUNTER — Ambulatory Visit: Attending: Orthopaedic Surgery | Admitting: Physical Therapy

## 2024-06-10 ENCOUNTER — Encounter: Payer: Self-pay | Admitting: Physical Therapy

## 2024-06-10 DIAGNOSIS — M25562 Pain in left knee: Secondary | ICD-10-CM | POA: Diagnosis not present

## 2024-06-10 DIAGNOSIS — Z96651 Presence of right artificial knee joint: Secondary | ICD-10-CM | POA: Insufficient documentation

## 2024-06-10 DIAGNOSIS — R262 Difficulty in walking, not elsewhere classified: Secondary | ICD-10-CM | POA: Diagnosis not present

## 2024-06-10 DIAGNOSIS — R600 Localized edema: Secondary | ICD-10-CM | POA: Insufficient documentation

## 2024-06-10 DIAGNOSIS — M25661 Stiffness of right knee, not elsewhere classified: Secondary | ICD-10-CM | POA: Diagnosis not present

## 2024-06-10 DIAGNOSIS — M25662 Stiffness of left knee, not elsewhere classified: Secondary | ICD-10-CM | POA: Insufficient documentation

## 2024-06-10 NOTE — Therapy (Signed)
 OUTPATIENT PHYSICAL THERAPY LOWER EXTREMITY TREATMENT   Patient Name: Sara Nicholson MRN: [id number] DOB:31-Jul-1956, 68 y.o., female Today's Date: 06/10/2024  END OF SESSION:  PT End of Session - 06/10/24 0846     Visit Number 7    Date for PT Re-Evaluation 08/12/24    PT Start Time 0845    PT Stop Time 0930    PT Time Calculation (min) 45 min    Activity Tolerance Patient tolerated treatment well    Behavior During Therapy [facility] for tasks assessed/performed          Past Medical History:  Diagnosis Date   Arthritis    Cancer (HCC)    squamous lesion right shoulder   Depression    Hypertension    Neuromuscular disorder (HCC)    Parkinson disease (HCC)    Pneumonia 1988   Past Surgical History:  Procedure Laterality Date   APPENDECTOMY     BREAST EXCISIONAL BIOPSY Left    benign cyst   CESAREAN SECTION     x1   COLONOSCOPY     TOTAL KNEE ARTHROPLASTY Left 01/17/2024   Procedure: ARTHROPLASTY, KNEE, TOTAL;  Surgeon: Vernetta Lonni GRADE, MD;  Location: [facility];  Service: Orthopedics;  Laterality: Left;   TOTAL KNEE ARTHROPLASTY Right 05/01/2024   Procedure: ARTHROPLASTY, KNEE, TOTAL;  Surgeon: Vernetta Lonni GRADE, MD;  Location: [facility];  Service: Orthopedics;  Laterality: Right;   Patient Active Problem List   Diagnosis Date Noted   Status post total right knee replacement 05/01/2024   Status post total left knee replacement 01/17/2024   Unilateral primary osteoarthritis, right knee 01/15/2023   Hyperlipidemia 11/08/2022   Class 2 severe obesity due to excess calories with serious comorbidity and body mass index (BMI) of 37.0 to 37.9 in adult (HCC) 11/08/2022   Parkinson's disease (HCC) 01/16/2021    PCP: Charmaine Bright  REFERRING PROVIDER: Lonni Vernetta  REFERRING DIAG:  [phone] (ICD-10-CM) - Status post total right knee replacement  M17.11 (ICD-10-CM) - Unilateral primary osteoarthritis, right knee    THERAPY DIAG:  Difficulty in walking, not  elsewhere classified  Stiffness of right knee, not elsewhere classified  S/P total knee arthroplasty, right  Localized edema  Rationale for Evaluation and Treatment: Rehabilitation  ONSET DATE: 05/01/24  SUBJECTIVE:   SUBJECTIVE STATEMENT: Swollen and tight  I don't really have pain. I am walking without a cane. I have been doing things it the house- sweeping, mopping, went down to the basement to the laundry. Doctor cleared me to drive.   05/14/24- First follow up with surgeon. Her extension is full and her flexion is to 91 degrees.  She is swollen but her calf is soft.  We have recommended compressive hose as well.    PERTINENT HISTORY: Parkinson's  L TKA 01/17/24 R TKA 05/01/24  PAIN:  Are you having pain? No  PRECAUTIONS: Knee  RED FLAGS: None   WEIGHT BEARING RESTRICTIONS: No  FALLS:  Has patient fallen in last 6 months? No  LIVING ENVIRONMENT: Lives with: lives with their spouse Lives in: House/apartment Stairs: Yes: Internal: 13 to get to basement steps; can reach both and External: 3 steps; none and but has a grab Has following equipment at home: Single point cane  OCCUPATION: Retired  PLOF: Independent and Independent with gait  PATIENT GOALS: get back to body combat, zumba and anything else I can get into   NEXT MD VISIT: September   OBJECTIVE:  Note: Objective measures were completed at Evaluation unless  otherwise noted.  DIAGNOSTIC FINDINGS: Right knee arthroplasty. No periprosthetic fracture or acute hardware complication. Anterior surgical staples. Subcutaneous edema and gas anteriorly.   IMPRESSION: Expected appearance after right knee arthroplasty.   COGNITION: Overall cognitive status: Within functional limits for tasks assessed     SENSATION: WFL  EDEMA:  Swelling in R knee  PALPATION: Increased swelling, some TTP, warm around scar   LOWER EXTREMITY ROM:  Active ROM Right eval Right 05/28/24 Right 06/04/24  Hip flexion      Hip extension     Hip abduction     Hip adduction     Hip internal rotation     Hip external rotation     Knee flexion 90 95 98  Knee extension 0  1 1  Ankle dorsiflexion     Ankle plantarflexion     Ankle inversion     Ankle eversion      (Blank rows = not tested)  LOWER EXTREMITY MMT: 4/5 in available ranges    FUNCTIONAL TESTS:  5 times sit to stand: 20s from chair  Timed up and go (TUG): 14s   GAIT: Distance walked: in clinic distances Assistive device utilized: None Level of assistance: Complete Independence Comments: antalgic gait, decreased step length and stance time on RLE                                                                                                                                 TREATMENT DATE:  06/10/24 NuStep L5 x 6 min LE only  HS curls 25lb 2x15  Leg Ext 10lb 2x10, RLE 5lb 2x10 Resisted gait 4 way x 4 each Sit to stand LE on airex 2x10  R knee PROM w/ end range holds Patellar mobs STM to surrounding knee structures Slant board calf stretch  06/04/24 NuStep L5 x 6 min LE only  Slant board calf stretch 6in step ups forward & Lateral step ups x10 R knee PROM w/ end range holds Patellar mobs STM to surrounding knee structures  Goals  Sit to stands holding yellow ball 2x15 HS curls 25lb 2x10  Leg Ext 10lb 2x10, RLE 5lb 2x5   06/02/24 NuStep L4 x 6 min LE only Leg press 50lb 2x12 6in forward & lateral step ups x 10 each 4in lateral step ups LLE 2x10  R knee PROM w/ end range holds Patellar mobs STM to surrounding knee structures  Sit to stand holding yellow ball 2x10 HS curls 25lb 2x10  Leg Ext 10lb 2x10  05/28/24 NuStep L5 x 6 min R knee PROM w/ end range holds Patellar mobs STM to surrounding knee structures  GOALS  5x sit to stands 12.13   Stairs HS curls 20lb 2x10  Leg Ext 5lb 2x10 Leg press 40lb 2x10, SL 20lb x10 each RLE LAQ 3lb 2x10 HS curls RLE blue 2x15   05/25/24 R knee PROM w/ end range holds Patellar  mobs STM to surrounding knee  structures  NuStep L 5 x 4 min Slant board calf stretch 6in forward and lateral step ups x10 each Leg press 40lb 2x10, SL 20lb x10 each RLE LAQ 3lb 2x10 HS curls RLE green 2x15  05/22/24 R knee PROM w/ end range holds Patellar mobs STM to surrounding knee structures  NuStep L 5 x 6 min LAQ 3lb RLE 2x12 HS curls RLE green 2x15 Sit to stand 2x10 4in forward and lateral step ups x10 each  05/20/24 EVAL   PATIENT EDUCATION:  Education details: POC, HEP, DVT education Person educated: Patient Education method: Explanation Education comprehension: verbalized understanding  HOME EXERCISE PROGRAM: LAQ Seated heel slides  Heel raises  Marching in place Squats   ASSESSMENT:  CLINICAL IMPRESSION: Patient is a 68 y.o. female who was seen today for physical therapy and treatment for R TKA.  Again doing really well despite having increase stiffness and swelling in both knees.  LLE weakness with the functional interventions today.  Patient able to tolerated increase reps with curls and extension.  Patient will benefit from PT to return to her normal gym routine which included Body Combat, Zumba, and working with a personal training 5x a week.   OBJECTIVE IMPAIRMENTS: Abnormal gait, decreased balance, difficulty walking, decreased ROM, decreased strength, increased edema, and pain.   ACTIVITY LIMITATIONS: sitting, standing, squatting, stairs, and locomotion level  PARTICIPATION LIMITATIONS: cleaning, shopping, community activity, yard work, and gym   REHAB POTENTIAL: Good  CLINICAL DECISION MAKING: Stable/uncomplicated  EVALUATION COMPLEXITY: Low   GOALS: Goals reviewed with patient? Yes   SHORT TERM GOALS: Target date: 07/01/25  Independent with initial HEP. Baseline:  Goal status: Met  05/28/24  2.  Patient will demonstrate improved functional LE strength by completing 5x STS in <15 seconds from chair height.  Baseline: 20s  Goal status:  Mety 05/28/24    LONG TERM GOALS: Target date: 08/12/24  Independent with advanced/ongoing HEP to improve outcomes and carryover.  Baseline:  Goal status: INITIAL  2.  Catlynn Grondahl Kozub will demonstrate R knee flexion to 120 deg to ascend/descend stairs. Baseline: 90d Goal status: Progressing 06/04/24  3.  Verdine E Kalt will be able to ambulate 500' with normal gait pattern to access community.  Baseline:  Goal status: Partly Met 06/04/24  4.  Kiari E Pechacek will be able to ascend/descend stairs with reciprocal step pattern safely to access home and community.  Baseline: step to pattern Goal status: Progressing 05/28/24  5.  Malaka E Baris will be able to return to her normal gym routine. (BodyCombat, Zumba, Yoga & working with Systems analyst)  Baseline: has not been back since first TKA Goal status: ongoing, but has been to the gym on her own 06/04/24  PLAN:  PT FREQUENCY: 2x/week  PT DURATION: 12 weeks  PLANNED INTERVENTIONS: 97110-Therapeutic exercises, 97530- Therapeutic activity, 97112- Neuromuscular re-education, 97535- Self Care, 02859- Manual therapy, 97116- Gait training, [phone] (1-2 muscles), 20561 (3+ muscles)- Dry Needling, Patient/Family education, Balance training, Stair training, Joint mobilization, Scar mobilization, and Cryotherapy  PLAN FOR NEXT SESSION: R knee PROM, functional tasks, R knee strengthening    Tanda KANDICE Sorrow, PTA 06/10/2024, 8:48 AM

## 2024-06-11 NOTE — Therapy (Signed)
 OUTPATIENT PHYSICAL THERAPY LOWER EXTREMITY TREATMENT   Patient Name: Sara Nicholson MRN: [id number] DOB:[birth year], 68 y.o., female Today's Date: 06/12/2024  END OF SESSION:  PT End of Session - 06/12/24 0847     Visit Number 8    Date for PT Re-Evaluation 08/12/24    PT Start Time 0846    PT Stop Time 0930    PT Time Calculation (min) 44 min    Activity Tolerance Patient tolerated treatment well    Behavior During Therapy [facility] for tasks assessed/performed           Past Medical History:  Diagnosis Date   Arthritis    Cancer (HCC)    squamous lesion right shoulder   Depression    Hypertension    Neuromuscular disorder (HCC)    Parkinson disease (HCC)    Pneumonia 1988   Past Surgical History:  Procedure Laterality Date   APPENDECTOMY     BREAST EXCISIONAL BIOPSY Left    benign cyst   CESAREAN SECTION     x1   COLONOSCOPY     TOTAL KNEE ARTHROPLASTY Left 01/17/2024   Procedure: ARTHROPLASTY, KNEE, TOTAL;  Surgeon: Vernetta Lonni GRADE, MD;  Location: [facility];  Service: Orthopedics;  Laterality: Left;   TOTAL KNEE ARTHROPLASTY Right 05/01/2024   Procedure: ARTHROPLASTY, KNEE, TOTAL;  Surgeon: Vernetta Lonni GRADE, MD;  Location: [facility];  Service: Orthopedics;  Laterality: Right;   Patient Active Problem List   Diagnosis Date Noted   Status post total right knee replacement 05/01/2024   Status post total left knee replacement 01/17/2024   Unilateral primary osteoarthritis, right knee 01/15/2023   Hyperlipidemia 11/08/2022   Class 2 severe obesity due to excess calories with serious comorbidity and body mass index (BMI) of 37.0 to 37.9 in adult [facility]) 11/08/2022   Parkinson's disease (HCC) 01/16/2021    PCP: Charmaine Bright  REFERRING PROVIDER: Lonni Vernetta  REFERRING DIAG:  [phone] (ICD-10-CM) - Status post total right knee replacement  M17.11 (ICD-10-CM) - Unilateral primary osteoarthritis, right knee    THERAPY DIAG:  Difficulty in walking,  not elsewhere classified  Stiffness of right knee, not elsewhere classified  S/P total knee arthroplasty, right  Localized edema  Rationale for Evaluation and Treatment: Rehabilitation  ONSET DATE: 05/01/24  SUBJECTIVE:   SUBJECTIVE STATEMENT: Just feels stiff. This knee has been harder than the first one.   PERTINENT HISTORY: Parkinson's  L TKA 01/17/24 R TKA 05/01/24  PAIN:  Are you having pain? No  PRECAUTIONS: Knee  RED FLAGS: None   WEIGHT BEARING RESTRICTIONS: No  FALLS:  Has patient fallen in last 6 months? No  LIVING ENVIRONMENT: Lives with: lives with their spouse Lives in: House/apartment Stairs: Yes: Internal: 13 to get to basement steps; can reach both and External: 3 steps; none and but has a grab Has following equipment at home: Single point cane  OCCUPATION: Retired  PLOF: Independent and Independent with gait  PATIENT GOALS: get back to body combat, zumba and anything else I can get into   NEXT MD VISIT: September   OBJECTIVE:  Note: Objective measures were completed at Evaluation unless otherwise noted.  DIAGNOSTIC FINDINGS: Right knee arthroplasty. No periprosthetic fracture or acute hardware complication. Anterior surgical staples. Subcutaneous edema and gas anteriorly.   IMPRESSION: Expected appearance after right knee arthroplasty.   COGNITION: Overall cognitive status: Within functional limits for tasks assessed     SENSATION: WFL  EDEMA:  Swelling in R knee  PALPATION: Increased swelling, some  TTP, warm around scar   LOWER EXTREMITY ROM:  Active ROM Right eval Right 05/28/24 Right 06/04/24  Hip flexion     Hip extension     Hip abduction     Hip adduction     Hip internal rotation     Hip external rotation     Knee flexion 90 95 98  Knee extension 0  1 1  Ankle dorsiflexion     Ankle plantarflexion     Ankle inversion     Ankle eversion      (Blank rows = not tested)  LOWER EXTREMITY MMT: 4/5 in available ranges     FUNCTIONAL TESTS:  5 times sit to stand: 20s from chair  Timed up and go (TUG): 14s   GAIT: Distance walked: in clinic distances Assistive device utilized: None Level of assistance: Complete Independence Comments: antalgic gait, decreased step length and stance time on RLE                                                                                                                                 TREATMENT DATE:  06/12/24 NuStep L5x83mins LE only Bike L3 x44mins  Calf stretch 20s x3 Recheck ROM- 100d  Leg ext 15# 2x10, RLE 5# x10 HS curls 25# 2x10 Leg press 20# 2x10, RLE no weight just ROM x10 Stairs  Step ups 6   06/10/24 NuStep L5 x 6 min LE only  HS curls 25lb 2x15  Leg Ext 10lb 2x10, RLE 5lb 2x10 Resisted gait 4 way x 4 each Sit to stand LE on airex 2x10  R knee PROM w/ end range holds Patellar mobs STM to surrounding knee structures Slant board calf stretch  06/04/24 NuStep L5 x 6 min LE only  Slant board calf stretch 6in step ups forward & Lateral step ups x10 R knee PROM w/ end range holds Patellar mobs STM to surrounding knee structures  Goals  Sit to stands holding yellow ball 2x15 HS curls 25lb 2x10  Leg Ext 10lb 2x10, RLE 5lb 2x5   06/02/24 NuStep L4 x 6 min LE only Leg press 50lb 2x12 6in forward & lateral step ups x 10 each 4in lateral step ups LLE 2x10  R knee PROM w/ end range holds Patellar mobs STM to surrounding knee structures  Sit to stand holding yellow ball 2x10 HS curls 25lb 2x10  Leg Ext 10lb 2x10  05/28/24 NuStep L5 x 6 min R knee PROM w/ end range holds Patellar mobs STM to surrounding knee structures  GOALS  5x sit to stands 12.13   Stairs HS curls 20lb 2x10  Leg Ext 5lb 2x10 Leg press 40lb 2x10, SL 20lb x10 each RLE LAQ 3lb 2x10 HS curls RLE blue 2x15   05/25/24 R knee PROM w/ end range holds Patellar mobs STM to surrounding knee structures  NuStep L 5 x 4 min Slant board calf stretch 6in forward and lateral  step  ups x10 each Leg press 40lb 2x10, SL 20lb x10 each RLE LAQ 3lb 2x10 HS curls RLE green 2x15  05/22/24 R knee PROM w/ end range holds Patellar mobs STM to surrounding knee structures  NuStep L 5 x 6 min LAQ 3lb RLE 2x12 HS curls RLE green 2x15 Sit to stand 2x10 4in forward and lateral step ups x10 each  05/20/24 EVAL   PATIENT EDUCATION:  Education details: POC, HEP, DVT education Person educated: Patient Education method: Explanation Education comprehension: verbalized understanding  HOME EXERCISE PROGRAM: LAQ Seated heel slides  Heel raises  Marching in place Squats   ASSESSMENT:  CLINICAL IMPRESSION: Patient is a 68 y.o. female who was seen today for physical therapy and treatment for R TKA.  Continues to do well despite c/o stiffness and swelling. She reports her leg was shaking/having tremors all night so that may be the reason she is tight today. She does has scar tissue built underneath the scar, can feel a lump. She is able to do stairs with 1HRA. However, step ups are still difficult and she has compensations on both sides. Advised to try practicing on her bottom step at home.  Patient will benefit from PT to return to her normal gym routine which included Body Combat, Zumba, and working with a personal training 5x a week.   OBJECTIVE IMPAIRMENTS: Abnormal gait, decreased balance, difficulty walking, decreased ROM, decreased strength, increased edema, and pain.   ACTIVITY LIMITATIONS: sitting, standing, squatting, stairs, and locomotion level  PARTICIPATION LIMITATIONS: cleaning, shopping, community activity, yard work, and gym   REHAB POTENTIAL: Good  CLINICAL DECISION MAKING: Stable/uncomplicated  EVALUATION COMPLEXITY: Low   GOALS: Goals reviewed with patient? Yes   SHORT TERM GOALS: Target date: 07/01/25  Independent with initial HEP. Baseline:  Goal status: Met  05/28/24  2.  Patient will demonstrate improved functional LE strength by  completing 5x STS in <15 seconds from chair height.  Baseline: 20s  Goal status: Met 05/28/24    LONG TERM GOALS: Target date: 08/12/24  Independent with advanced/ongoing HEP to improve outcomes and carryover.  Baseline:  Goal status: INITIAL  2.  Estephania Licciardi Reckner will demonstrate R knee flexion to 120 deg to ascend/descend stairs. Baseline: 90d Goal status: Progressing 06/04/24, 100d 06/12/24  3.  Corbyn E Austill will be able to ambulate 500' with normal gait pattern to access community.  Baseline:  Goal status: Partly Met 06/04/24  4.  Itzy E Sperl will be able to ascend/descend stairs with reciprocal step pattern safely to access home and community.  Baseline: step to pattern Goal status: Progressing 05/28/24, MET w/1HRA 06/12/24  5.  Lindalee E Aust will be able to return to her normal gym routine. (BodyCombat, Zumba, Yoga & working with Systems analyst)  Baseline: has not been back since first TKA Goal status: ongoing, but has been to the gym on her own 06/04/24, been to gym a few times but no classes 06/12/24  PLAN:  PT FREQUENCY: 2x/week  PT DURATION: 12 weeks  PLANNED INTERVENTIONS: 97110-Therapeutic exercises, 97530- Therapeutic activity, 97112- Neuromuscular re-education, 97535- Self Care, [id number]- Manual therapy, 97116- Gait training, [phone] (1-2 muscles), 20561 (3+ muscles)- Dry Needling, Patient/Family education, Balance training, Stair training, Joint mobilization, Scar mobilization, and Cryotherapy  PLAN FOR NEXT SESSION: R knee PROM, functional tasks, R knee strengthening    Almetta Fam, PT 06/12/2024, 9:26 AM

## 2024-06-12 ENCOUNTER — Ambulatory Visit

## 2024-06-12 DIAGNOSIS — R262 Difficulty in walking, not elsewhere classified: Secondary | ICD-10-CM | POA: Diagnosis not present

## 2024-06-12 DIAGNOSIS — R600 Localized edema: Secondary | ICD-10-CM | POA: Diagnosis not present

## 2024-06-12 DIAGNOSIS — M25661 Stiffness of right knee, not elsewhere classified: Secondary | ICD-10-CM

## 2024-06-12 DIAGNOSIS — Z96651 Presence of right artificial knee joint: Secondary | ICD-10-CM | POA: Diagnosis not present

## 2024-06-12 DIAGNOSIS — M25562 Pain in left knee: Secondary | ICD-10-CM | POA: Diagnosis not present

## 2024-06-12 DIAGNOSIS — M25662 Stiffness of left knee, not elsewhere classified: Secondary | ICD-10-CM | POA: Diagnosis not present

## 2024-06-15 ENCOUNTER — Ambulatory Visit (INDEPENDENT_AMBULATORY_CARE_PROVIDER_SITE_OTHER): Admitting: Orthopaedic Surgery

## 2024-06-15 ENCOUNTER — Encounter: Payer: Self-pay | Admitting: Orthopaedic Surgery

## 2024-06-15 DIAGNOSIS — Z96651 Presence of right artificial knee joint: Secondary | ICD-10-CM

## 2024-06-15 NOTE — Progress Notes (Signed)
 The patient is about 7 weeks status post a right total knee arthroplasty and over 4 months from a left knee replacement.  She is a very active 67 year old female.  She reports she is getting better in terms of motion and mobility.  She said the second knee was definitely hard on the first.  On exam her right knee has full extension and I can flex her to about 100 degrees.  Her left knee is entirely full in terms of range of motion.  Both knees feel ligamentously stable.  She is walking without assistive device.  She still has more therapy visits.  From our standpoint the next time we need to see her in 3 months.  Will have a standing AP and lateral of both knees at that visit.

## 2024-06-16 ENCOUNTER — Encounter: Payer: Self-pay | Admitting: Physical Therapy

## 2024-06-16 ENCOUNTER — Ambulatory Visit: Admitting: Physical Therapy

## 2024-06-16 DIAGNOSIS — Z96651 Presence of right artificial knee joint: Secondary | ICD-10-CM | POA: Diagnosis not present

## 2024-06-16 DIAGNOSIS — R600 Localized edema: Secondary | ICD-10-CM | POA: Diagnosis not present

## 2024-06-16 DIAGNOSIS — M25661 Stiffness of right knee, not elsewhere classified: Secondary | ICD-10-CM

## 2024-06-16 DIAGNOSIS — M25562 Pain in left knee: Secondary | ICD-10-CM | POA: Diagnosis not present

## 2024-06-16 DIAGNOSIS — M25662 Stiffness of left knee, not elsewhere classified: Secondary | ICD-10-CM | POA: Diagnosis not present

## 2024-06-16 DIAGNOSIS — R262 Difficulty in walking, not elsewhere classified: Secondary | ICD-10-CM

## 2024-06-16 NOTE — Therapy (Signed)
 OUTPATIENT PHYSICAL THERAPY LOWER EXTREMITY TREATMENT   Patient Name: Sara Nicholson MRN: [id number] DOB:April 15, 1956, 68 y.o., female Today's Date: 06/16/2024  END OF SESSION:  PT End of Session - 06/16/24 0846     Visit Number 9    Date for PT Re-Evaluation 08/12/24    PT Start Time 0847    PT Stop Time 0930    PT Time Calculation (min) 43 min    Activity Tolerance Patient tolerated treatment well    Behavior During Therapy [facility] for tasks assessed/performed           Past Medical History:  Diagnosis Date   Arthritis    Cancer (HCC)    squamous lesion right shoulder   Depression    Hypertension    Neuromuscular disorder (HCC)    Parkinson disease (HCC)    Pneumonia 1988   Past Surgical History:  Procedure Laterality Date   APPENDECTOMY     BREAST EXCISIONAL BIOPSY Left    benign cyst   CESAREAN SECTION     x1   COLONOSCOPY     TOTAL KNEE ARTHROPLASTY Left 01/17/2024   Procedure: ARTHROPLASTY, KNEE, TOTAL;  Surgeon: Vernetta Lonni GRADE, MD;  Location: [facility];  Service: Orthopedics;  Laterality: Left;   TOTAL KNEE ARTHROPLASTY Right 05/01/2024   Procedure: ARTHROPLASTY, KNEE, TOTAL;  Surgeon: Vernetta Lonni GRADE, MD;  Location: [facility];  Service: Orthopedics;  Laterality: Right;   Patient Active Problem List   Diagnosis Date Noted   Status post total right knee replacement 05/01/2024   Status post total left knee replacement 01/17/2024   Unilateral primary osteoarthritis, right knee 01/15/2023   Hyperlipidemia 11/08/2022   Class 2 severe obesity due to excess calories with serious comorbidity and body mass index (BMI) of 37.0 to 37.9 in adult [facility]) 11/08/2022   Parkinson's disease (HCC) 01/16/2021    PCP: Charmaine Bright  REFERRING PROVIDER: Lonni Vernetta  REFERRING DIAG:  [phone] (ICD-10-CM) - Status post total right knee replacement  M17.11 (ICD-10-CM) - Unilateral primary osteoarthritis, right knee    THERAPY DIAG:  Difficulty in walking,  not elsewhere classified  Stiffness of right knee, not elsewhere classified  S/P total knee arthroplasty, right  Localized edema  Rationale for Evaluation and Treatment: Rehabilitation  ONSET DATE: 05/01/24  SUBJECTIVE:   SUBJECTIVE STATEMENT: Good MD was please yesterday. Went to the gy yesterday and rode the bike  PERTINENT HISTORY: Parkinson's  L TKA 01/17/24 R TKA 05/01/24  PAIN:  Are you having pain? No  PRECAUTIONS: Knee  RED FLAGS: None   WEIGHT BEARING RESTRICTIONS: No  FALLS:  Has patient fallen in last 6 months? No  LIVING ENVIRONMENT: Lives with: lives with their spouse Lives in: House/apartment Stairs: Yes: Internal: 13 to get to basement steps; can reach both and External: 3 steps; none and but has a grab Has following equipment at home: Single point cane  OCCUPATION: Retired  PLOF: Independent and Independent with gait  PATIENT GOALS: get back to body combat, zumba and anything else I can get into   NEXT MD VISIT: September   OBJECTIVE:  Note: Objective measures were completed at Evaluation unless otherwise noted.  DIAGNOSTIC FINDINGS: Right knee arthroplasty. No periprosthetic fracture or acute hardware complication. Anterior surgical staples. Subcutaneous edema and gas anteriorly.   IMPRESSION: Expected appearance after right knee arthroplasty.   COGNITION: Overall cognitive status: Within functional limits for tasks assessed     SENSATION: WFL  EDEMA:  Swelling in R knee  PALPATION: Increased swelling,  some TTP, warm around scar   LOWER EXTREMITY ROM:  Active ROM Right eval Right 05/28/24 Right 06/04/24  Hip flexion     Hip extension     Hip abduction     Hip adduction     Hip internal rotation     Hip external rotation     Knee flexion 90 95 98  Knee extension 0  1 1  Ankle dorsiflexion     Ankle plantarflexion     Ankle inversion     Ankle eversion      (Blank rows = not tested)  LOWER EXTREMITY MMT: 4/5 in  available ranges    FUNCTIONAL TESTS:  5 times sit to stand: 20s from chair  Timed up and go (TUG): 14s   GAIT: Distance walked: in clinic distances Assistive device utilized: None Level of assistance: Complete Independence Comments: antalgic gait, decreased step length and stance time on RLE                                                                                                                                 TREATMENT DATE:  06/16/24 NuStep L5 x 4 mins LE only Bike L2.5 x3 mins  Gait around the back building in parking lot  2 flights of stairs alt pattern 1 rail Sit to stand holding blue ball 2x10 Leg press single leg 30lb 4x5 each  4 in step ups HS curls 25# 2x10 Leg Ext 10lb 2x10  06/12/24 NuStep L5x15mins LE only Bike L3 x9mins  Calf stretch 20s x3 Recheck ROM- 100d  Leg ext 15# 2x10, RLE 5# x10 HS curls 25# 2x10 Leg press 20# 2x10, RLE no weight just ROM x10 Stairs  Step ups 6   06/10/24 NuStep L5 x 6 min LE only  HS curls 25lb 2x15  Leg Ext 10lb 2x10, RLE 5lb 2x10 Resisted gait 4 way x 4 each Sit to stand LE on airex 2x10  R knee PROM w/ end range holds Patellar mobs STM to surrounding knee structures Slant board calf stretch  06/04/24 NuStep L5 x 6 min LE only  Slant board calf stretch 6in step ups forward & Lateral step ups x10 R knee PROM w/ end range holds Patellar mobs STM to surrounding knee structures  Goals  Sit to stands holding yellow ball 2x15 HS curls 25lb 2x10  Leg Ext 10lb 2x10, RLE 5lb 2x5   06/02/24 NuStep L4 x 6 min LE only Leg press 50lb 2x12 6in forward & lateral step ups x 10 each 4in lateral step ups LLE 2x10  R knee PROM w/ end range holds Patellar mobs STM to surrounding knee structures  Sit to stand holding yellow ball 2x10 HS curls 25lb 2x10  Leg Ext 10lb 2x10  05/28/24 NuStep L5 x 6 min R knee PROM w/ end range holds Patellar mobs STM to surrounding knee structures  GOALS  5x sit to stands 12.13    Stairs  HS curls 20lb 2x10  Leg Ext 5lb 2x10 Leg press 40lb 2x10, SL 20lb x10 each RLE LAQ 3lb 2x10 HS curls RLE blue 2x15   05/25/24 R knee PROM w/ end range holds Patellar mobs STM to surrounding knee structures  NuStep L 5 x 4 min Slant board calf stretch 6in forward and lateral step ups x10 each Leg press 40lb 2x10, SL 20lb x10 each RLE LAQ 3lb 2x10 HS curls RLE green 2x15  05/22/24 R knee PROM w/ end range holds Patellar mobs STM to surrounding knee structures  NuStep L 5 x 6 min LAQ 3lb RLE 2x12 HS curls RLE green 2x15 Sit to stand 2x10 4in forward and lateral step ups x10 each  05/20/24 EVAL   PATIENT EDUCATION:  Education details: POC, HEP, DVT education Person educated: Patient Education method: Explanation Education comprehension: verbalized understanding  HOME EXERCISE PROGRAM: LAQ Seated heel slides  Heel raises  Marching in place Squats   ASSESSMENT:  CLINICAL IMPRESSION: Patient is a 68 y.o. female who was seen today for physical therapy and treatment for R TKA.  Continues to do well some c/o stiffness and swelling with outdoor ambulation.   She is able to do stairs with 1HRA, she does tend to pull herself up when advancing with RLE.   Patient will benefit from PT to return to her normal gym routine which included Body Combat, Zumba, and working with a personal training 5x a week.   OBJECTIVE IMPAIRMENTS: Abnormal gait, decreased balance, difficulty walking, decreased ROM, decreased strength, increased edema, and pain.   ACTIVITY LIMITATIONS: sitting, standing, squatting, stairs, and locomotion level  PARTICIPATION LIMITATIONS: cleaning, shopping, community activity, yard work, and gym   REHAB POTENTIAL: Good  CLINICAL DECISION MAKING: Stable/uncomplicated  EVALUATION COMPLEXITY: Low   GOALS: Goals reviewed with patient? Yes   SHORT TERM GOALS: Target date: 07/01/25  Independent with initial HEP. Baseline:  Goal status: Met   05/28/24  2.  Patient will demonstrate improved functional LE strength by completing 5x STS in <15 seconds from chair height.  Baseline: 20s  Goal status: Met 05/28/24    LONG TERM GOALS: Target date: 08/12/24  Independent with advanced/ongoing HEP to improve outcomes and carryover.  Baseline:  Goal status: INITIAL  2.  Sara Nicholson will demonstrate R knee flexion to 120 deg to ascend/descend stairs. Baseline: 90d Goal status: Progressing 06/04/24, 100d 06/12/24  3.  Sara Nicholson will be able to ambulate 500' with normal gait pattern to access community.  Baseline:  Goal status: Partly Met 06/04/24  4.  Sara Nicholson will be able to ascend/descend stairs with reciprocal step pattern safely to access home and community.  Baseline: step to pattern Goal status: Progressing 05/28/24, MET w/1HRA 06/12/24  5.  Sara Nicholson will be able to return to her normal gym routine. (BodyCombat, Zumba, Yoga & working with Systems analyst)  Baseline: has not been back since first TKA Goal status: ongoing, but has been to the gym on her own 06/04/24, been to gym a few times but no classes 06/12/24  PLAN:  PT FREQUENCY: 2x/week  PT DURATION: 12 weeks  PLANNED INTERVENTIONS: 97110-Therapeutic exercises, 97530- Therapeutic activity, 97112- Neuromuscular re-education, 97535- Self Care, 02859- Manual therapy, 97116- Gait training, [phone] (1-2 muscles), 20561 (3+ muscles)- Dry Needling, Patient/Family education, Balance training, Stair training, Joint mobilization, Scar mobilization, and Cryotherapy  PLAN FOR NEXT SESSION: R knee PROM, functional tasks, R knee strengthening    Tanda KANDICE Sorrow, PTA 06/16/2024, 8:49  AM

## 2024-06-19 ENCOUNTER — Ambulatory Visit: Admitting: Physical Therapy

## 2024-06-19 DIAGNOSIS — Z96651 Presence of right artificial knee joint: Secondary | ICD-10-CM

## 2024-06-19 DIAGNOSIS — R600 Localized edema: Secondary | ICD-10-CM | POA: Diagnosis not present

## 2024-06-19 DIAGNOSIS — M25562 Pain in left knee: Secondary | ICD-10-CM | POA: Diagnosis not present

## 2024-06-19 DIAGNOSIS — M25661 Stiffness of right knee, not elsewhere classified: Secondary | ICD-10-CM

## 2024-06-19 DIAGNOSIS — M25662 Stiffness of left knee, not elsewhere classified: Secondary | ICD-10-CM | POA: Diagnosis not present

## 2024-06-19 DIAGNOSIS — R262 Difficulty in walking, not elsewhere classified: Secondary | ICD-10-CM | POA: Diagnosis not present

## 2024-06-19 NOTE — Therapy (Signed)
 OUTPATIENT PHYSICAL THERAPY LOWER EXTREMITY TREATMENT Progress Note Reporting Period 05/20/24 to 06/19/24  See note below for Objective Data and Assessment of Progress/Goals.      Patient Name: Sara Nicholson MRN: [id number] DOB:May 09, 1956, 68 y.o., female Today's Date: 06/19/2024  END OF SESSION:  PT End of Session - 06/19/24 0843     Visit Number 10    Date for PT Re-Evaluation 08/12/24    PT Start Time 0843    PT Stop Time 0925    PT Time Calculation (min) 42 min           Past Medical History:  Diagnosis Date   Arthritis    Cancer (HCC)    squamous lesion right shoulder   Depression    Hypertension    Neuromuscular disorder (HCC)    Parkinson disease (HCC)    Pneumonia 1988   Past Surgical History:  Procedure Laterality Date   APPENDECTOMY     BREAST EXCISIONAL BIOPSY Left    benign cyst   CESAREAN SECTION     x1   COLONOSCOPY     TOTAL KNEE ARTHROPLASTY Left 01/17/2024   Procedure: ARTHROPLASTY, KNEE, TOTAL;  Surgeon: Vernetta Lonni GRADE, MD;  Location: [facility];  Service: Orthopedics;  Laterality: Left;   TOTAL KNEE ARTHROPLASTY Right 05/01/2024   Procedure: ARTHROPLASTY, KNEE, TOTAL;  Surgeon: Vernetta Lonni GRADE, MD;  Location: [facility];  Service: Orthopedics;  Laterality: Right;   Patient Active Problem List   Diagnosis Date Noted   Status post total right knee replacement 05/01/2024   Status post total left knee replacement 01/17/2024   Unilateral primary osteoarthritis, right knee 01/15/2023   Hyperlipidemia 11/08/2022   Class 2 severe obesity due to excess calories with serious comorbidity and body mass index (BMI) of 37.0 to 37.9 in adult [facility]) 11/08/2022   Parkinson's disease (HCC) 01/16/2021    PCP: Charmaine Bright  REFERRING PROVIDER: Lonni Vernetta  REFERRING DIAG:  [phone] (ICD-10-CM) - Status post total right knee replacement  M17.11 (ICD-10-CM) - Unilateral primary osteoarthritis, right knee    THERAPY DIAG:  Difficulty  in walking, not elsewhere classified  Stiffness of right knee, not elsewhere classified  S/P total knee arthroplasty, right  Rationale for Evaluation and Treatment: Rehabilitation  ONSET DATE: 05/01/24  SUBJECTIVE:   SUBJECTIVE STATEMENT: Doing good overall, just stiff and sore  PERTINENT HISTORY: Parkinson's  L TKA 01/17/24 R TKA 05/01/24  PAIN:  Are you having pain? No  PRECAUTIONS: Knee  RED FLAGS: None   WEIGHT BEARING RESTRICTIONS: No  FALLS:  Has patient fallen in last 6 months? No  LIVING ENVIRONMENT: Lives with: lives with their spouse Lives in: House/apartment Stairs: Yes: Internal: 13 to get to basement steps; can reach both and External: 3 steps; none and but has a grab Has following equipment at home: Single point cane  OCCUPATION: Retired  PLOF: Independent and Independent with gait  PATIENT GOALS: get back to body combat, zumba and anything else I can get into   NEXT MD VISIT: September   OBJECTIVE:  Note: Objective measures were completed at Evaluation unless otherwise noted.  DIAGNOSTIC FINDINGS: Right knee arthroplasty. No periprosthetic fracture or acute hardware complication. Anterior surgical staples. Subcutaneous edema and gas anteriorly.   IMPRESSION: Expected appearance after right knee arthroplasty.   COGNITION: Overall cognitive status: Within functional limits for tasks assessed     SENSATION: WFL  EDEMA:  Swelling in R knee  PALPATION: Increased swelling, some TTP, warm around scar   LOWER  EXTREMITY ROM:  Active ROM Right eval Right 05/28/24 Right 06/04/24 Left 06/19/24  Hip flexion      Hip extension      Hip abduction      Hip adduction      Hip internal rotation      Hip external rotation      Knee flexion 90 95 98 105  Knee extension 0  1 1   Ankle dorsiflexion      Ankle plantarflexion      Ankle inversion      Ankle eversion       (Blank rows = not tested)  LOWER EXTREMITY MMT: 4/5 in available ranges     FUNCTIONAL TESTS:  5 times sit to stand: 20s from chair  Timed up and go (TUG): 14s   GAIT: Distance walked: in clinic distances Assistive device utilized: None Level of assistance: Complete Independence Comments: antalgic gait, decreased step length and stance time on RLE                                                                                                                                 TREATMENT DATE:   06/19/24 Nustep L 5 Elliptical 1.5 min each way  20# resisted gait with 6 in step up 5 x each leg Leg press 40# 2 sets 10 . Rt SL 30# 3 sets 5 PROM/stretching with ROM STM to scar esp proximal HS curl 25# BIL 2 sets 10   SL 15# 10 x Knee ext 10# 2 sets 10 SL 5# 10 x      06/16/24 NuStep L5 x 4 mins LE only Bike L2.5 x3 mins  Gait around the back building in parking lot  2 flights of stairs alt pattern 1 rail Sit to stand holding blue ball 2x10 Leg press single leg 30lb 4x5 each  4 in step ups HS curls 25# 2x10 Leg Ext 10lb 2x10  06/12/24 NuStep L5x32mins LE only Bike L3 x67mins  Calf stretch 20s x3 Recheck ROM- 100d  Leg ext 15# 2x10, RLE 5# x10 HS curls 25# 2x10 Leg press 20# 2x10, RLE no weight just ROM x10 Stairs  Step ups 6   06/10/24 NuStep L5 x 6 min LE only  HS curls 25lb 2x15  Leg Ext 10lb 2x10, RLE 5lb 2x10 Resisted gait 4 way x 4 each Sit to stand LE on airex 2x10  R knee PROM w/ end range holds Patellar mobs STM to surrounding knee structures Slant board calf stretch  06/04/24 NuStep L5 x 6 min LE only  Slant board calf stretch 6in step ups forward & Lateral step ups x10 R knee PROM w/ end range holds Patellar mobs STM to surrounding knee structures  Goals  Sit to stands holding yellow ball 2x15 HS curls 25lb 2x10  Leg Ext 10lb 2x10, RLE 5lb 2x5   06/02/24 NuStep L4 x 6 min LE only Leg press 50lb 2x12 6in  forward & lateral step ups x 10 each 4in lateral step ups LLE 2x10  R knee PROM w/ end range holds Patellar  mobs STM to surrounding knee structures  Sit to stand holding yellow ball 2x10 HS curls 25lb 2x10  Leg Ext 10lb 2x10  05/28/24 NuStep L5 x 6 min R knee PROM w/ end range holds Patellar mobs STM to surrounding knee structures  GOALS  5x sit to stands 12.13   Stairs HS curls 20lb 2x10  Leg Ext 5lb 2x10 Leg press 40lb 2x10, SL 20lb x10 each RLE LAQ 3lb 2x10 HS curls RLE blue 2x15   05/25/24 R knee PROM w/ end range holds Patellar mobs STM to surrounding knee structures  NuStep L 5 x 4 min Slant board calf stretch 6in forward and lateral step ups x10 each Leg press 40lb 2x10, SL 20lb x10 each RLE LAQ 3lb 2x10 HS curls RLE green 2x15  05/22/24 R knee PROM w/ end range holds Patellar mobs STM to surrounding knee structures  NuStep L 5 x 6 min LAQ 3lb RLE 2x12 HS curls RLE green 2x15 Sit to stand 2x10 4in forward and lateral step ups x10 each  05/20/24 EVAL   PATIENT EDUCATION:  Education details: POC, HEP, DVT education Person educated: Patient Education method: Explanation Education comprehension: verbalized understanding  HOME EXERCISE PROGRAM: LAQ Seated heel slides  Heel raises  Marching in place Squats   ASSESSMENT:  CLINICAL IMPRESSION: Patient is a 68 y.o. female who was seen today for physical therapy and treatment for R TKA.  ROM is progressing. Goals assessed and documneted. Patient will benefit from PT to return to her normal gym routine which included Body Combat, Zumba, and working with a personal training 5x a week.   OBJECTIVE IMPAIRMENTS: Abnormal gait, decreased balance, difficulty walking, decreased ROM, decreased strength, increased edema, and pain.   ACTIVITY LIMITATIONS: sitting, standing, squatting, stairs, and locomotion level  PARTICIPATION LIMITATIONS: cleaning, shopping, community activity, yard work, and gym   REHAB POTENTIAL: Good  CLINICAL DECISION MAKING: Stable/uncomplicated  EVALUATION COMPLEXITY: Low   GOALS: Goals  reviewed with patient? Yes   SHORT TERM GOALS: Target date: 07/01/25  Independent with initial HEP. Baseline:  Goal status: Met  05/28/24  2.  Patient will demonstrate improved functional LE strength by completing 5x STS in <15 seconds from chair height.  Baseline: 20s  Goal status: Met 05/28/24    LONG TERM GOALS: Target date: 08/12/24  Independent with advanced/ongoing HEP to improve outcomes and carryover.  Baseline:  Goal status: 06/19/24 progressing  2.  Sara Nicholson will demonstrate R knee flexion to 120 deg to ascend/descend stairs. Baseline: 90d Goal status: Progressing 06/04/24, 100d 06/12/24  06/19/24 progressing  3.  Sara Nicholson will be able to ambulate 500' with normal gait pattern to access community.  Baseline:  Goal status: Partly Met 06/04/24  and 06/19/24  4.  Sara Nicholson will be able to ascend/descend stairs with reciprocal step pattern safely to access home and community.  Baseline: step to pattern Goal status: Progressing 05/28/24, MET w/1HRA 06/12/24  5.  Sara Nicholson will be able to return to her normal gym routine. (BodyCombat, Zumba, Yoga & working with Systems analyst)  Baseline: has not been back since first TKA Goal status: ongoing, but has been to the gym on her own 06/04/24, been to gym a few times but no classes 06/12/24  PLAN:  PT FREQUENCY: 2x/week  PT DURATION: 12 weeks  PLANNED INTERVENTIONS: 97110-Therapeutic  exercises, 97530- Therapeutic activity, [id number]- Neuromuscular re-education, [phone]- Self Care, 02859- Manual therapy, [phone]- Gait training, [phone] (1-2 muscles), [phone] (3+ muscles)- Dry Needling, Patient/Family education, Balance training, Stair training, Joint mobilization, Scar mobilization, and Cryotherapy  PLAN FOR NEXT SESSION: R knee PROM, functional tasks, R knee strengthening    Flo Berroa,ANGIE, PTA 06/19/2024, 9:26 AM

## 2024-06-22 ENCOUNTER — Encounter: Payer: Self-pay | Admitting: Physical Therapy

## 2024-06-22 ENCOUNTER — Ambulatory Visit: Admitting: Physical Therapy

## 2024-06-22 DIAGNOSIS — R600 Localized edema: Secondary | ICD-10-CM

## 2024-06-22 DIAGNOSIS — Z96651 Presence of right artificial knee joint: Secondary | ICD-10-CM | POA: Diagnosis not present

## 2024-06-22 DIAGNOSIS — M25661 Stiffness of right knee, not elsewhere classified: Secondary | ICD-10-CM

## 2024-06-22 DIAGNOSIS — M25662 Stiffness of left knee, not elsewhere classified: Secondary | ICD-10-CM | POA: Diagnosis not present

## 2024-06-22 DIAGNOSIS — R262 Difficulty in walking, not elsewhere classified: Secondary | ICD-10-CM

## 2024-06-22 DIAGNOSIS — M25562 Pain in left knee: Secondary | ICD-10-CM | POA: Diagnosis not present

## 2024-06-22 NOTE — Therapy (Signed)
 OUTPATIENT PHYSICAL THERAPY LOWER EXTREMITY TREATMENT     Patient Name: Sara Nicholson MRN: [id number] DOB:12-Jun-1956, 68 y.o., female Today's Date: 06/22/2024  END OF SESSION:  PT End of Session - 06/22/24 0849     Visit Number 11    Date for PT Re-Evaluation 08/12/24    PT Start Time 0843    PT Stop Time 0930    PT Time Calculation (min) 47 min    Activity Tolerance Patient tolerated treatment well    Behavior During Therapy [facility] for tasks assessed/performed           Past Medical History:  Diagnosis Date   Arthritis    Cancer (HCC)    squamous lesion right shoulder   Depression    Hypertension    Neuromuscular disorder (HCC)    Parkinson disease (HCC)    Pneumonia 1988   Past Surgical History:  Procedure Laterality Date   APPENDECTOMY     BREAST EXCISIONAL BIOPSY Left    benign cyst   CESAREAN SECTION     x1   COLONOSCOPY     TOTAL KNEE ARTHROPLASTY Left 01/17/2024   Procedure: ARTHROPLASTY, KNEE, TOTAL;  Surgeon: Vernetta Lonni GRADE, MD;  Location: [facility];  Service: Orthopedics;  Laterality: Left;   TOTAL KNEE ARTHROPLASTY Right 05/01/2024   Procedure: ARTHROPLASTY, KNEE, TOTAL;  Surgeon: Vernetta Lonni GRADE, MD;  Location: [facility];  Service: Orthopedics;  Laterality: Right;   Patient Active Problem List   Diagnosis Date Noted   Status post total right knee replacement 05/01/2024   Status post total left knee replacement 01/17/2024   Unilateral primary osteoarthritis, right knee 01/15/2023   Hyperlipidemia 11/08/2022   Class 2 severe obesity due to excess calories with serious comorbidity and body mass index (BMI) of 37.0 to 37.9 in adult [facility]) 11/08/2022   Parkinson's disease (HCC) 01/16/2021    PCP: Charmaine Bright  REFERRING PROVIDER: Lonni Vernetta  REFERRING DIAG:  [phone] (ICD-10-CM) - Status post total right knee replacement  M17.11 (ICD-10-CM) - Unilateral primary osteoarthritis, right knee    THERAPY DIAG:  Difficulty in  walking, not elsewhere classified  Stiffness of right knee, not elsewhere classified  S/P total knee arthroplasty, right  Localized edema  Rationale for Evaluation and Treatment: Rehabilitation  ONSET DATE: 05/01/24  SUBJECTIVE:   SUBJECTIVE STATEMENT: I feel good a little tight   PERTINENT HISTORY: Parkinson's  L TKA 01/17/24 R TKA 05/01/24  PAIN:  Are you having pain? No  PRECAUTIONS: Knee  RED FLAGS: None   WEIGHT BEARING RESTRICTIONS: No  FALLS:  Has patient fallen in last 6 months? No  LIVING ENVIRONMENT: Lives with: lives with their spouse Lives in: House/apartment Stairs: Yes: Internal: 13 to get to basement steps; can reach both and External: 3 steps; none and but has a grab Has following equipment at home: Single point cane  OCCUPATION: Retired  PLOF: Independent and Independent with gait  PATIENT GOALS: get back to body combat, zumba and anything else I can get into   NEXT MD VISIT: September   OBJECTIVE:  Note: Objective measures were completed at Evaluation unless otherwise noted.  DIAGNOSTIC FINDINGS: Right knee arthroplasty. No periprosthetic fracture or acute hardware complication. Anterior surgical staples. Subcutaneous edema and gas anteriorly.   IMPRESSION: Expected appearance after right knee arthroplasty.   COGNITION: Overall cognitive status: Within functional limits for tasks assessed     SENSATION: WFL  EDEMA:  Swelling in R knee  PALPATION: Increased swelling, some TTP, warm around scar  LOWER EXTREMITY ROM:  Active ROM Right eval Right 05/28/24 Right 06/04/24 Left 06/19/24  Hip flexion      Hip extension      Hip abduction      Hip adduction      Hip internal rotation      Hip external rotation      Knee flexion 90 95 98 105  Knee extension 0  1 1   Ankle dorsiflexion      Ankle plantarflexion      Ankle inversion      Ankle eversion       (Blank rows = not tested)  LOWER EXTREMITY MMT: 4/5 in available  ranges    FUNCTIONAL TESTS:  5 times sit to stand: 20s from chair  Timed up and go (TUG): 14s   GAIT: Distance walked: in clinic distances Assistive device utilized: None Level of assistance: Complete Independence Comments: antalgic gait, decreased step length and stance time on RLE                                                                                                                                 TREATMENT DATE:  06/22/24 Two flights of stairs 1 rail alt pattern,  3 flights descending outside up hill amabulation Leg press 40lb x15, 50lb x15, SL 20lb 2x5 each  6in step ups x10 each  Heel raises 2x15  Sit to stand LE on airex holding 9lb 2x10 R knee PROM w/ end range holds Patellar mobs STM to surrounding knee structures   06/19/24 Nustep L 5 Elliptical 1.5 min each way  20# resisted gait with 6 in step up 5 x each leg Leg press 40# 2 sets 10 . Rt SL 30# 3 sets 5 PROM/stretching with ROM STM to scar esp proximal HS curl 25# BIL 2 sets 10   SL 15# 10 x Knee ext 10# 2 sets 10 SL 5# 10 x      06/16/24 NuStep L5 x 4 mins LE only Bike L2.5 x3 mins  Gait around the back building in parking lot  2 flights of stairs alt pattern 1 rail Sit to stand holding blue ball 2x10 Leg press single leg 30lb 4x5 each  4 in step ups HS curls 25# 2x10 Leg Ext 10lb 2x10  06/12/24 NuStep L5x74mins LE only Bike L3 x47mins  Calf stretch 20s x3 Recheck ROM- 100d  Leg ext 15# 2x10, RLE 5# x10 HS curls 25# 2x10 Leg press 20# 2x10, RLE no weight just ROM x10 Stairs  Step ups 6   06/10/24 NuStep L5 x 6 min LE only  HS curls 25lb 2x15  Leg Ext 10lb 2x10, RLE 5lb 2x10 Resisted gait 4 way x 4 each Sit to stand LE on airex 2x10  R knee PROM w/ end range holds Patellar mobs STM to surrounding knee structures Slant board calf stretch  06/04/24 NuStep L5 x 6 min LE only  Slant  board calf stretch 6in step ups forward & Lateral step ups x10 R knee PROM w/ end range  holds Patellar mobs STM to surrounding knee structures  Goals  Sit to stands holding yellow ball 2x15 HS curls 25lb 2x10  Leg Ext 10lb 2x10, RLE 5lb 2x5   06/02/24 NuStep L4 x 6 min LE only Leg press 50lb 2x12 6in forward & lateral step ups x 10 each 4in lateral step ups LLE 2x10  R knee PROM w/ end range holds Patellar mobs STM to surrounding knee structures  Sit to stand holding yellow ball 2x10 HS curls 25lb 2x10  Leg Ext 10lb 2x10  05/28/24 NuStep L5 x 6 min R knee PROM w/ end range holds Patellar mobs STM to surrounding knee structures  GOALS  5x sit to stands 12.13   Stairs HS curls 20lb 2x10  Leg Ext 5lb 2x10 Leg press 40lb 2x10, SL 20lb x10 each RLE LAQ 3lb 2x10 HS curls RLE blue 2x15   05/25/24 R knee PROM w/ end range holds Patellar mobs STM to surrounding knee structures  NuStep L 5 x 4 min Slant board calf stretch 6in forward and lateral step ups x10 each Leg press 40lb 2x10, SL 20lb x10 each RLE LAQ 3lb 2x10 HS curls RLE green 2x15  05/22/24 R knee PROM w/ end range holds Patellar mobs STM to surrounding knee structures  NuStep L 5 x 6 min LAQ 3lb RLE 2x12 HS curls RLE green 2x15 Sit to stand 2x10 4in forward and lateral step ups x10 each  05/20/24 EVAL   PATIENT EDUCATION:  Education details: POC, HEP, DVT education Person educated: Patient Education method: Explanation Education comprehension: verbalized understanding  HOME EXERCISE PROGRAM: LAQ Seated heel slides  Heel raises  Marching in place Squats   ASSESSMENT:  CLINICAL IMPRESSION: Patient is a 68 y.o. female who was seen today for physical therapy and treatment for R TKA.  Progressed with functional interventions. UE use needed when ascending stairs. Pt die demo functional weakness w both LE when ascending stairs. R knee can passively be pushed with 120 deg, but with pain.  Patient will benefit from PT to return to her normal gym routine which included Body Combat, Zumba,  and working with a personal training 5x a week.   OBJECTIVE IMPAIRMENTS: Abnormal gait, decreased balance, difficulty walking, decreased ROM, decreased strength, increased edema, and pain.   ACTIVITY LIMITATIONS: sitting, standing, squatting, stairs, and locomotion level  PARTICIPATION LIMITATIONS: cleaning, shopping, community activity, yard work, and gym   REHAB POTENTIAL: Good  CLINICAL DECISION MAKING: Stable/uncomplicated  EVALUATION COMPLEXITY: Low   GOALS: Goals reviewed with patient? Yes   SHORT TERM GOALS: Target date: 07/01/25  Independent with initial HEP. Baseline:  Goal status: Met  05/28/24  2.  Patient will demonstrate improved functional LE strength by completing 5x STS in <15 seconds from chair height.  Baseline: 20s  Goal status: Met 05/28/24    LONG TERM GOALS: Target date: 08/12/24  Independent with advanced/ongoing HEP to improve outcomes and carryover.  Baseline:  Goal status: 06/19/24 progressing  2.  Trishia Cuthrell Piggott will demonstrate R knee flexion to 120 deg to ascend/descend stairs. Baseline: 90d Goal status: Progressing 06/04/24, 100d 06/12/24  06/19/24 progressing  3.  Kebra E Molyneux will be able to ambulate 500' with normal gait pattern to access community.  Baseline:  Goal status: Partly Met 06/04/24  and 06/19/24  4.  Kharizma E Lun will be able to ascend/descend stairs with reciprocal step  pattern safely to access home and community.  Baseline: step to pattern Goal status: Progressing 05/28/24, MET w/1HRA 06/12/24  5.  Carreen E Geeslin will be able to return to her normal gym routine. (BodyCombat, Zumba, Yoga & working with Systems analyst)  Baseline: has not been back since first TKA Goal status: ongoing, but has been to the gym on her own 06/04/24, been to gym a few times but no classes 06/12/24  PLAN:  PT FREQUENCY: 2x/week  PT DURATION: 12 weeks  PLANNED INTERVENTIONS: 97110-Therapeutic exercises, 97530- Therapeutic activity, 97112-  Neuromuscular re-education, 97535- Self Care, 02859- Manual therapy, 97116- Gait training, [phone] (1-2 muscles), 20561 (3+ muscles)- Dry Needling, Patient/Family education, Balance training, Stair training, Joint mobilization, Scar mobilization, and Cryotherapy  PLAN FOR NEXT SESSION: R knee PROM, functional tasks, R knee strengthening    Tanda KANDICE Sorrow, PTA 06/22/2024, 8:50 AM

## 2024-06-23 DIAGNOSIS — L738 Other specified follicular disorders: Secondary | ICD-10-CM | POA: Diagnosis not present

## 2024-06-23 DIAGNOSIS — Z85820 Personal history of malignant melanoma of skin: Secondary | ICD-10-CM | POA: Diagnosis not present

## 2024-06-23 DIAGNOSIS — D485 Neoplasm of uncertain behavior of skin: Secondary | ICD-10-CM | POA: Diagnosis not present

## 2024-06-23 DIAGNOSIS — Z85828 Personal history of other malignant neoplasm of skin: Secondary | ICD-10-CM | POA: Diagnosis not present

## 2024-06-23 DIAGNOSIS — L905 Scar conditions and fibrosis of skin: Secondary | ICD-10-CM | POA: Diagnosis not present

## 2024-06-23 DIAGNOSIS — D2272 Melanocytic nevi of left lower limb, including hip: Secondary | ICD-10-CM | POA: Diagnosis not present

## 2024-06-23 DIAGNOSIS — L821 Other seborrheic keratosis: Secondary | ICD-10-CM | POA: Diagnosis not present

## 2024-06-23 DIAGNOSIS — D2271 Melanocytic nevi of right lower limb, including hip: Secondary | ICD-10-CM | POA: Diagnosis not present

## 2024-06-23 DIAGNOSIS — D225 Melanocytic nevi of trunk: Secondary | ICD-10-CM | POA: Diagnosis not present

## 2024-06-23 DIAGNOSIS — D2239 Melanocytic nevi of other parts of face: Secondary | ICD-10-CM | POA: Diagnosis not present

## 2024-06-25 ENCOUNTER — Other Ambulatory Visit: Payer: Self-pay | Admitting: Neurology

## 2024-06-25 ENCOUNTER — Encounter: Payer: Self-pay | Admitting: Physical Therapy

## 2024-06-25 ENCOUNTER — Ambulatory Visit: Admitting: Physical Therapy

## 2024-06-25 DIAGNOSIS — M25661 Stiffness of right knee, not elsewhere classified: Secondary | ICD-10-CM

## 2024-06-25 DIAGNOSIS — Z96651 Presence of right artificial knee joint: Secondary | ICD-10-CM

## 2024-06-25 DIAGNOSIS — M25662 Stiffness of left knee, not elsewhere classified: Secondary | ICD-10-CM | POA: Diagnosis not present

## 2024-06-25 DIAGNOSIS — G20A1 Parkinson's disease without dyskinesia, without mention of fluctuations: Secondary | ICD-10-CM

## 2024-06-25 DIAGNOSIS — R262 Difficulty in walking, not elsewhere classified: Secondary | ICD-10-CM | POA: Diagnosis not present

## 2024-06-25 DIAGNOSIS — M25562 Pain in left knee: Secondary | ICD-10-CM | POA: Diagnosis not present

## 2024-06-25 DIAGNOSIS — R600 Localized edema: Secondary | ICD-10-CM

## 2024-06-25 NOTE — Therapy (Signed)
 OUTPATIENT PHYSICAL THERAPY LOWER EXTREMITY TREATMENT     Patient Name: Sara Nicholson MRN: [id number] DOB:28-Apr-1956, 68 y.o., female Today's Date: 06/25/2024  END OF SESSION:  PT End of Session - 06/25/24 0847     Visit Number 12    Date for PT Re-Evaluation 08/12/24    PT Start Time 0845    PT Stop Time 0930    PT Time Calculation (min) 45 min    Activity Tolerance Patient tolerated treatment well    Behavior During Therapy [facility] for tasks assessed/performed           Past Medical History:  Diagnosis Date   Arthritis    Cancer (HCC)    squamous lesion right shoulder   Depression    Hypertension    Neuromuscular disorder (HCC)    Parkinson disease (HCC)    Pneumonia 1988   Past Surgical History:  Procedure Laterality Date   APPENDECTOMY     BREAST EXCISIONAL BIOPSY Left    benign cyst   CESAREAN SECTION     x1   COLONOSCOPY     TOTAL KNEE ARTHROPLASTY Left 01/17/2024   Procedure: ARTHROPLASTY, KNEE, TOTAL;  Surgeon: Vernetta Lonni GRADE, MD;  Location: [facility];  Service: Orthopedics;  Laterality: Left;   TOTAL KNEE ARTHROPLASTY Right 05/01/2024   Procedure: ARTHROPLASTY, KNEE, TOTAL;  Surgeon: Vernetta Lonni GRADE, MD;  Location: [facility];  Service: Orthopedics;  Laterality: Right;   Patient Active Problem List   Diagnosis Date Noted   Status post total right knee replacement 05/01/2024   Status post total left knee replacement 01/17/2024   Unilateral primary osteoarthritis, right knee 01/15/2023   Hyperlipidemia 11/08/2022   Class 2 severe obesity due to excess calories with serious comorbidity and body mass index (BMI) of 37.0 to 37.9 in adult (HCC) 11/08/2022   Parkinson's disease (HCC) 01/16/2021    PCP: Charmaine Bright  REFERRING PROVIDER: Lonni Vernetta  REFERRING DIAG:  [phone] (ICD-10-CM) - Status post total right knee replacement  M17.11 (ICD-10-CM) - Unilateral primary osteoarthritis, right knee    THERAPY DIAG:  Difficulty in  walking, not elsewhere classified  Stiffness of right knee, not elsewhere classified  S/P total knee arthroplasty, right  Localized edema  Rationale for Evaluation and Treatment: Rehabilitation  ONSET DATE: 05/01/24  SUBJECTIVE:   SUBJECTIVE STATEMENT: R knee is tight and stiff, both are swollen  PERTINENT HISTORY: Parkinson's  L TKA 01/17/24 R TKA 05/01/24  PAIN:  Are you having pain? No  PRECAUTIONS: Knee  RED FLAGS: None   WEIGHT BEARING RESTRICTIONS: No  FALLS:  Has patient fallen in last 6 months? No  LIVING ENVIRONMENT: Lives with: lives with their spouse Lives in: House/apartment Stairs: Yes: Internal: 13 to get to basement steps; can reach both and External: 3 steps; none and but has a grab Has following equipment at home: Single point cane  OCCUPATION: Retired  PLOF: Independent and Independent with gait  PATIENT GOALS: get back to body combat, zumba and anything else I can get into   NEXT MD VISIT: September   OBJECTIVE:  Note: Objective measures were completed at Evaluation unless otherwise noted.  DIAGNOSTIC FINDINGS: Right knee arthroplasty. No periprosthetic fracture or acute hardware complication. Anterior surgical staples. Subcutaneous edema and gas anteriorly.   IMPRESSION: Expected appearance after right knee arthroplasty.   COGNITION: Overall cognitive status: Within functional limits for tasks assessed     SENSATION: WFL  EDEMA:  Swelling in R knee  PALPATION: Increased swelling, some TTP, warm  around scar   LOWER EXTREMITY ROM:  Active ROM Right eval Right 05/28/24 Right 06/04/24 Right 06/19/24 Right   Hip flexion       Hip extension       Hip abduction       Hip adduction       Hip internal rotation       Hip external rotation       Knee flexion 90 95 98 105 106/ P115  Knee extension 0  1 1  1   Ankle dorsiflexion       Ankle plantarflexion       Ankle inversion       Ankle eversion        (Blank rows = not  tested)  LOWER EXTREMITY MMT: 4/5 in available ranges    FUNCTIONAL TESTS:  5 times sit to stand: 20s from chair  Timed up and go (TUG): 14s   GAIT: Distance walked: in clinic distances Assistive device utilized: None Level of assistance: Complete Independence Comments: antalgic gait, decreased step length and stance time on RLE                                                                                                                                 TREATMENT DATE:  06/25/24 Bike L 3 x 6 min Leg press 50lb 2x15, SL 20lb 2x5 each R knee PROM w/ end range holds Patellar mobs STM to surrounding knee structures 6in step ups 2x10  40lb resisted gait 4 way x 3 each  Heel raises black bar 2x15 Slant board calf stretch TRX reverse lunges x5 each Bike L 3  x 5 min   06/22/24 Two flights of stairs 1 rail alt pattern,  3 flights descending outside up hill amabulation Leg press 40lb x15, 50lb x15, SL 20lb 2x5 each  6in step ups x10 each  Heel raises 2x15  Sit to stand LE on airex holding 9lb 2x10 R knee PROM w/ end range holds Patellar mobs STM to surrounding knee structures   06/19/24 Nustep L 5 Elliptical 1.5 min each way  20# resisted gait with 6 in step up 5 x each leg Leg press 40# 2 sets 10 . Rt SL 30# 3 sets 5 PROM/stretching with ROM STM to scar esp proximal HS curl 25# BIL 2 sets 10   SL 15# 10 x Knee ext 10# 2 sets 10 SL 5# 10 x      06/16/24 NuStep L5 x 4 mins LE only Bike L2.5 x3 mins  Gait around the back building in parking lot  2 flights of stairs alt pattern 1 rail Sit to stand holding blue ball 2x10 Leg press single leg 30lb 4x5 each  4 in step ups HS curls 25# 2x10 Leg Ext 10lb 2x10  06/12/24 NuStep L5x53mins LE only Bike L3 x26mins  Calf stretch 20s x3 Recheck ROM- 100d  Leg ext 15# 2x10, RLE 5# x10 HS  curls 25# 2x10 Leg press 20# 2x10, RLE no weight just ROM x10 Stairs  Step ups 6   06/10/24 NuStep L5 x 6 min LE only  HS curls  25lb 2x15  Leg Ext 10lb 2x10, RLE 5lb 2x10 Resisted gait 4 way x 4 each Sit to stand LE on airex 2x10  R knee PROM w/ end range holds Patellar mobs STM to surrounding knee structures Slant board calf stretch  06/04/24 NuStep L5 x 6 min LE only  Slant board calf stretch 6in step ups forward & Lateral step ups x10 R knee PROM w/ end range holds Patellar mobs STM to surrounding knee structures  Goals  Sit to stands holding yellow ball 2x15 HS curls 25lb 2x10  Leg Ext 10lb 2x10, RLE 5lb 2x5   06/02/24 NuStep L4 x 6 min LE only Leg press 50lb 2x12 6in forward & lateral step ups x 10 each 4in lateral step ups LLE 2x10  R knee PROM w/ end range holds Patellar mobs STM to surrounding knee structures  Sit to stand holding yellow ball 2x10 HS curls 25lb 2x10  Leg Ext 10lb 2x10   PATIENT EDUCATION:  Education details: POC, HEP, DVT education Person educated: Patient Education method: Explanation Education comprehension: verbalized understanding  HOME EXERCISE PROGRAM: LAQ Seated heel slides  Heel raises  Marching in place Squats   ASSESSMENT:  CLINICAL IMPRESSION: Patient is a 68 y.o. female who was seen today for physical therapy and treatment for R TKA. She is progressing well, tolerating increase resistance on leg press.  Pt demo functional weakness w / both LE doing step ups.  Some pain with reverse TRX lunges.  Patient will benefit from PT to return to her normal gym routine which included Body Combat, Zumba, and working with a personal training 5x a week.   OBJECTIVE IMPAIRMENTS: Abnormal gait, decreased balance, difficulty walking, decreased ROM, decreased strength, increased edema, and pain.   ACTIVITY LIMITATIONS: sitting, standing, squatting, stairs, and locomotion level  PARTICIPATION LIMITATIONS: cleaning, shopping, community activity, yard work, and gym   REHAB POTENTIAL: Good  CLINICAL DECISION MAKING: Stable/uncomplicated  EVALUATION COMPLEXITY:  Low   GOALS: Goals reviewed with patient? Yes   SHORT TERM GOALS: Target date: 07/01/25  Independent with initial HEP. Baseline:  Goal status: Met  05/28/24  2.  Patient will demonstrate improved functional LE strength by completing 5x STS in <15 seconds from chair height.  Baseline: 20s  Goal status: Met 05/28/24    LONG TERM GOALS: Target date: 08/12/24  Independent with advanced/ongoing HEP to improve outcomes and carryover.  Baseline:  Goal status: 06/19/24 progressing  2.  Sara Nicholson will demonstrate R knee flexion to 120 deg to ascend/descend stairs. Baseline: 90d Goal status: Progressing 06/04/24, 100d 06/12/24  06/25/24 progressing  3.  Sara Nicholson will be able to ambulate 500' with normal gait pattern to access community.  Baseline:  Goal status: Partly Met 06/04/24  and 06/19/24  4.  Sara Nicholson will be able to ascend/descend stairs with reciprocal step pattern safely to access home and community.  Baseline: step to pattern Goal status: Progressing 05/28/24, MET w/1HRA 06/12/24  5.  Sara Nicholson will be able to return to her normal gym routine. (BodyCombat, Zumba, Yoga & working with Systems analyst)  Baseline: has not been back since first TKA Goal status: ongoing, but has been to the gym on her own 06/04/24, been to gym a few times but no classes 06/12/24, 06/25/24 has attended  one body combat class  PLAN:  PT FREQUENCY: 2x/week  PT DURATION: 12 weeks  PLANNED INTERVENTIONS: 97110-Therapeutic exercises, 97530- Therapeutic activity, 97112- Neuromuscular re-education, 97535- Self Care, [id number]- Manual therapy, [phone]- Gait training, [phone] (1-2 muscles), [phone] (3+ muscles)- Dry Needling, Patient/Family education, Balance training, Stair training, Joint mobilization, Scar mobilization, and Cryotherapy  PLAN FOR NEXT SESSION: R knee PROM, functional tasks, R knee strengthening    Tanda KANDICE Sorrow, PTA 06/25/2024, 8:48 AM

## 2024-06-29 ENCOUNTER — Encounter: Payer: Self-pay | Admitting: Physical Therapy

## 2024-06-29 ENCOUNTER — Ambulatory Visit: Admitting: Physical Therapy

## 2024-06-29 DIAGNOSIS — D485 Neoplasm of uncertain behavior of skin: Secondary | ICD-10-CM | POA: Diagnosis not present

## 2024-06-29 DIAGNOSIS — M25662 Stiffness of left knee, not elsewhere classified: Secondary | ICD-10-CM

## 2024-06-29 DIAGNOSIS — M25661 Stiffness of right knee, not elsewhere classified: Secondary | ICD-10-CM | POA: Diagnosis not present

## 2024-06-29 DIAGNOSIS — R262 Difficulty in walking, not elsewhere classified: Secondary | ICD-10-CM

## 2024-06-29 DIAGNOSIS — R600 Localized edema: Secondary | ICD-10-CM | POA: Diagnosis not present

## 2024-06-29 DIAGNOSIS — M25562 Pain in left knee: Secondary | ICD-10-CM | POA: Diagnosis not present

## 2024-06-29 DIAGNOSIS — Z96651 Presence of right artificial knee joint: Secondary | ICD-10-CM

## 2024-06-29 NOTE — Therapy (Signed)
 OUTPATIENT PHYSICAL THERAPY LOWER EXTREMITY TREATMENT     Patient Name: Sara Nicholson MRN: [id number] DOB:1956-06-11, 68 y.o., female Today's Date: 06/29/2024  END OF SESSION:  PT End of Session - 06/29/24 0848     Visit Number 13    Date for Recertification  08/12/24    PT Start Time 0845    PT Stop Time 0930    PT Time Calculation (min) 45 min    Activity Tolerance Patient tolerated treatment well    Behavior During Therapy [facility] for tasks assessed/performed           Past Medical History:  Diagnosis Date   Arthritis    Cancer (HCC)    squamous lesion right shoulder   Depression    Hypertension    Neuromuscular disorder (HCC)    Parkinson disease (HCC)    Pneumonia 1988   Past Surgical History:  Procedure Laterality Date   APPENDECTOMY     BREAST EXCISIONAL BIOPSY Left    benign cyst   CESAREAN SECTION     x1   COLONOSCOPY     TOTAL KNEE ARTHROPLASTY Left 01/17/2024   Procedure: ARTHROPLASTY, KNEE, TOTAL;  Surgeon: Vernetta Lonni GRADE, MD;  Location: [facility];  Service: Orthopedics;  Laterality: Left;   TOTAL KNEE ARTHROPLASTY Right 05/01/2024   Procedure: ARTHROPLASTY, KNEE, TOTAL;  Surgeon: Vernetta Lonni GRADE, MD;  Location: [facility];  Service: Orthopedics;  Laterality: Right;   Patient Active Problem List   Diagnosis Date Noted   Status post total right knee replacement 05/01/2024   Status post total left knee replacement 01/17/2024   Unilateral primary osteoarthritis, right knee 01/15/2023   Hyperlipidemia 11/08/2022   Class 2 severe obesity due to excess calories with serious comorbidity and body mass index (BMI) of 37.0 to 37.9 in adult (HCC) 11/08/2022   Parkinson's disease (HCC) 01/16/2021    PCP: Charmaine Bright  REFERRING PROVIDER: Lonni Vernetta  REFERRING DIAG:  [phone] (ICD-10-CM) - Status post total right knee replacement  M17.11 (ICD-10-CM) - Unilateral primary osteoarthritis, right knee    THERAPY DIAG:  Difficulty in  walking, not elsewhere classified  Stiffness of right knee, not elsewhere classified  Localized edema  Stiffness of left knee, not elsewhere classified  S/P total knee arthroplasty, right  Acute pain of left knee  Rationale for Evaluation and Treatment: Rehabilitation  ONSET DATE: 05/01/24  SUBJECTIVE:   SUBJECTIVE STATEMENT: I feel good, just tight  PERTINENT HISTORY: Parkinson's  L TKA 01/17/24 R TKA 05/01/24  PAIN:  Are you having pain? No  PRECAUTIONS: Knee  RED FLAGS: None   WEIGHT BEARING RESTRICTIONS: No  FALLS:  Has patient fallen in last 6 months? No  LIVING ENVIRONMENT: Lives with: lives with their spouse Lives in: House/apartment Stairs: Yes: Internal: 13 to get to basement steps; can reach both and External: 3 steps; none and but has a grab Has following equipment at home: Single point cane  OCCUPATION: Retired  PLOF: Independent and Independent with gait  PATIENT GOALS: get back to body combat, zumba and anything else I can get into   NEXT MD VISIT: September   OBJECTIVE:  Note: Objective measures were completed at Evaluation unless otherwise noted.  DIAGNOSTIC FINDINGS: Right knee arthroplasty. No periprosthetic fracture or acute hardware complication. Anterior surgical staples. Subcutaneous edema and gas anteriorly.   IMPRESSION: Expected appearance after right knee arthroplasty.   COGNITION: Overall cognitive status: Within functional limits for tasks assessed     SENSATION: WFL  EDEMA:  Swelling  in R knee  PALPATION: Increased swelling, some TTP, warm around scar   LOWER EXTREMITY ROM:  Active ROM Right eval Right 05/28/24 Right 06/04/24 Right 06/19/24 Right   Hip flexion       Hip extension       Hip abduction       Hip adduction       Hip internal rotation       Hip external rotation       Knee flexion 90 95 98 105 106/ P115  Knee extension 0  1 1  1   Ankle dorsiflexion       Ankle plantarflexion       Ankle  inversion       Ankle eversion        (Blank rows = not tested)  LOWER EXTREMITY MMT: 4/5 in available ranges    FUNCTIONAL TESTS:  5 times sit to stand: 20s from chair  Timed up and go (TUG): 14s   GAIT: Distance walked: in clinic distances Assistive device utilized: None Level of assistance: Complete Independence Comments: antalgic gait, decreased step length and stance time on RLE                                                                                                                                 TREATMENT DATE:  06/29/24 Bike L 2.5 x 6 min  Slant board calf stretch  Leg press 70lb 3x10 6in forward & Lateral step ups x10 each HS curls 35lb 2x10 Leg ext 10lb 2x10, SL 5lb x10 each S2S on airex holding yellow ball 2x12  TRX reverse lunges 2x10 each  Squats x10 Mini split squats without UE assist  06/25/24 Bike L 3 x 6 min Leg press 50lb 2x15, SL 20lb 2x5 each R knee PROM w/ end range holds Patellar mobs STM to surrounding knee structures 6in step ups 2x10  40lb resisted gait 4 way x 3 each  Heel raises black bar 2x15 Slant board calf stretch TRX reverse lunges x5 each Bike L 3  x 5 min   06/22/24 Two flights of stairs 1 rail alt pattern,  3 flights descending outside up hill amabulation Leg press 40lb x15, 50lb x15, SL 20lb 2x5 each  6in step ups x10 each  Heel raises 2x15  Sit to stand LE on airex holding 9lb 2x10 R knee PROM w/ end range holds Patellar mobs STM to surrounding knee structures   06/19/24 Nustep L 5 Elliptical 1.5 min each way  20# resisted gait with 6 in step up 5 x each leg Leg press 40# 2 sets 10 . Rt SL 30# 3 sets 5 PROM/stretching with ROM STM to scar esp proximal HS curl 25# BIL 2 sets 10   SL 15# 10 x Knee ext 10# 2 sets 10 SL 5# 10 x      06/16/24 NuStep L5 x 4 mins LE only Bike L2.5 x3 mins  Gait around the back building in parking lot  2 flights of stairs alt pattern 1 rail Sit to stand holding blue ball  2x10 Leg press single leg 30lb 4x5 each  4 in step ups HS curls 25# 2x10 Leg Ext 10lb 2x10  06/12/24 NuStep L5x68mins LE only Bike L3 x24mins  Calf stretch 20s x3 Recheck ROM- 100d  Leg ext 15# 2x10, RLE 5# x10 HS curls 25# 2x10 Leg press 20# 2x10, RLE no weight just ROM x10 Stairs  Step ups 6   06/10/24 NuStep L5 x 6 min LE only  HS curls 25lb 2x15  Leg Ext 10lb 2x10, RLE 5lb 2x10 Resisted gait 4 way x 4 each Sit to stand LE on airex 2x10  R knee PROM w/ end range holds Patellar mobs STM to surrounding knee structures Slant board calf stretch  06/04/24 NuStep L5 x 6 min LE only  Slant board calf stretch 6in step ups forward & Lateral step ups x10 R knee PROM w/ end range holds Patellar mobs STM to surrounding knee structures  Goals  Sit to stands holding yellow ball 2x15 HS curls 25lb 2x10  Leg Ext 10lb 2x10, RLE 5lb 2x5   06/02/24 NuStep L4 x 6 min LE only Leg press 50lb 2x12 6in forward & lateral step ups x 10 each 4in lateral step ups LLE 2x10  R knee PROM w/ end range holds Patellar mobs STM to surrounding knee structures  Sit to stand holding yellow ball 2x10 HS curls 25lb 2x10  Leg Ext 10lb 2x10   PATIENT EDUCATION:  Education details: POC, HEP, DVT education Person educated: Patient Education method: Explanation Education comprehension: verbalized understanding  HOME EXERCISE PROGRAM: LAQ Seated heel slides  Heel raises  Marching in place Squats   ASSESSMENT:  CLINICAL IMPRESSION: Patient is a 68 y.o. female who was seen today for physical therapy and treatment for R TKA. She continues to progress increasing resistance on leg press.  Pt again demo functional weakness w / both LE doing step ups. Cues to prevent knee valgus with sit to stands.  Patient will benefit from PT to return to her normal gym routine which included Body Combat, Zumba, and working with a personal training 5x a week.   OBJECTIVE IMPAIRMENTS: Abnormal gait, decreased  balance, difficulty walking, decreased ROM, decreased strength, increased edema, and pain.   ACTIVITY LIMITATIONS: sitting, standing, squatting, stairs, and locomotion level  PARTICIPATION LIMITATIONS: cleaning, shopping, community activity, yard work, and gym   REHAB POTENTIAL: Good  CLINICAL DECISION MAKING: Stable/uncomplicated  EVALUATION COMPLEXITY: Low   GOALS: Goals reviewed with patient? Yes   SHORT TERM GOALS: Target date: 07/01/25  Independent with initial HEP. Baseline:  Goal status: Met  05/28/24  2.  Patient will demonstrate improved functional LE strength by completing 5x STS in <15 seconds from chair height.  Baseline: 20s  Goal status: Met 05/28/24    LONG TERM GOALS: Target date: 08/12/24  Independent with advanced/ongoing HEP to improve outcomes and carryover.  Baseline:  Goal status: 06/19/24 progressing  2.  Sara Nicholson will demonstrate R knee flexion to 120 deg to ascend/descend stairs. Baseline: 90d Goal status: Progressing 06/04/24, 100d 06/12/24  06/25/24 progressing  3.  Sara Nicholson will be able to ambulate 500' with normal gait pattern to access community.  Baseline:  Goal status: Partly Met 06/04/24  and 06/19/24  4.  Sara Nicholson will be able to ascend/descend stairs with reciprocal step pattern safely to access home and  community.  Baseline: step to pattern Goal status: Progressing 05/28/24, MET w/1HRA 06/12/24  5.  Sara Nicholson will be able to return to her normal gym routine. (BodyCombat, Zumba, Yoga & working with Systems analyst)  Baseline: has not been back since first TKA Goal status: ongoing, but has been to the gym on her own 06/04/24, been to gym a few times but no classes 06/12/24, 06/25/24 has attended one body combat class  PLAN:  PT FREQUENCY: 2x/week  PT DURATION: 12 weeks  PLANNED INTERVENTIONS: 97110-Therapeutic exercises, 97530- Therapeutic activity, 97112- Neuromuscular re-education, 97535- Self Care, 02859- Manual  therapy, [id number]- Gait training, [phone] (1-2 muscles), [id number] (3+ muscles)- Dry Needling, Patient/Family education, Balance training, Stair training, Joint mobilization, Scar mobilization, and Cryotherapy  PLAN FOR NEXT SESSION: R knee PROM, functional tasks, R knee strengthening    Tanda KANDICE Sorrow, PTA 06/29/2024, 8:49 AM

## 2024-07-02 ENCOUNTER — Encounter: Payer: Self-pay | Admitting: Physical Therapy

## 2024-07-02 ENCOUNTER — Ambulatory Visit: Admitting: Physical Therapy

## 2024-07-02 DIAGNOSIS — R262 Difficulty in walking, not elsewhere classified: Secondary | ICD-10-CM

## 2024-07-02 DIAGNOSIS — R600 Localized edema: Secondary | ICD-10-CM | POA: Diagnosis not present

## 2024-07-02 DIAGNOSIS — M25661 Stiffness of right knee, not elsewhere classified: Secondary | ICD-10-CM | POA: Diagnosis not present

## 2024-07-02 DIAGNOSIS — M25562 Pain in left knee: Secondary | ICD-10-CM | POA: Diagnosis not present

## 2024-07-02 DIAGNOSIS — M25662 Stiffness of left knee, not elsewhere classified: Secondary | ICD-10-CM | POA: Diagnosis not present

## 2024-07-02 DIAGNOSIS — Z96651 Presence of right artificial knee joint: Secondary | ICD-10-CM | POA: Diagnosis not present

## 2024-07-02 NOTE — Therapy (Signed)
 OUTPATIENT PHYSICAL THERAPY LOWER EXTREMITY TREATMENT     Patient Name: Sara Nicholson MRN: [id number] DOB:[birth year], 68 y.o., female Today's Date: 07/06/2024  END OF SESSION:  PT End of Session - 07/06/24 0847     Visit Number 15    Date for Recertification  08/12/24    PT Start Time 0847    PT Stop Time 0930    PT Time Calculation (min) 43 min    Activity Tolerance Patient tolerated treatment well    Behavior During Therapy [facility] for tasks assessed/performed            Past Medical History:  Diagnosis Date   Arthritis    Cancer (HCC)    squamous lesion right shoulder   Depression    Hypertension    Neuromuscular disorder (HCC)    Parkinson disease (HCC)    Pneumonia 1988   Past Surgical History:  Procedure Laterality Date   APPENDECTOMY     BREAST EXCISIONAL BIOPSY Left    benign cyst   CESAREAN SECTION     x1   COLONOSCOPY     TOTAL KNEE ARTHROPLASTY Left 01/17/2024   Procedure: ARTHROPLASTY, KNEE, TOTAL;  Surgeon: Vernetta Lonni GRADE, MD;  Location: [facility];  Service: Orthopedics;  Laterality: Left;   TOTAL KNEE ARTHROPLASTY Right 05/01/2024   Procedure: ARTHROPLASTY, KNEE, TOTAL;  Surgeon: Vernetta Lonni GRADE, MD;  Location: [facility];  Service: Orthopedics;  Laterality: Right;   Patient Active Problem List   Diagnosis Date Noted   Status post total right knee replacement 05/01/2024   Status post total left knee replacement 01/17/2024   Unilateral primary osteoarthritis, right knee 01/15/2023   Hyperlipidemia 11/08/2022   Class 2 severe obesity due to excess calories with serious comorbidity and body mass index (BMI) of 37.0 to 37.9 in adult 11/08/2022   Parkinson's disease (HCC) 01/16/2021    PCP: Charmaine Bright  REFERRING PROVIDER: Lonni Vernetta  REFERRING DIAG:  [phone] (ICD-10-CM) - Status post total right knee replacement  M17.11 (ICD-10-CM) - Unilateral primary osteoarthritis, right knee    THERAPY DIAG:  Stiffness of right  knee, not elsewhere classified  Difficulty in walking, not elsewhere classified  S/P total knee arthroplasty, right  Rationale for Evaluation and Treatment: Rehabilitation  ONSET DATE: 05/01/24  SUBJECTIVE:   SUBJECTIVE STATEMENT: The knee is tight.   PERTINENT HISTORY: Parkinson's  L TKA 01/17/24 R TKA 05/01/24  PAIN:  Are you having pain? No  PRECAUTIONS: Knee  RED FLAGS: None   WEIGHT BEARING RESTRICTIONS: No  FALLS:  Has patient fallen in last 6 months? No  LIVING ENVIRONMENT: Lives with: lives with their spouse Lives in: House/apartment Stairs: Yes: Internal: 13 to get to basement steps; can reach both and External: 3 steps; none and but has a grab Has following equipment at home: Single point cane  OCCUPATION: Retired  PLOF: Independent and Independent with gait  PATIENT GOALS: get back to body combat, zumba and anything else I can get into   NEXT MD VISIT: September   OBJECTIVE:  Note: Objective measures were completed at Evaluation unless otherwise noted.  DIAGNOSTIC FINDINGS: Right knee arthroplasty. No periprosthetic fracture or acute hardware complication. Anterior surgical staples. Subcutaneous edema and gas anteriorly.   IMPRESSION: Expected appearance after right knee arthroplasty.   COGNITION: Overall cognitive status: Within functional limits for tasks assessed     SENSATION: WFL  EDEMA:  Swelling in R knee  PALPATION: Increased swelling, some TTP, warm around scar   LOWER EXTREMITY ROM:  Active ROM Right eval Right 05/28/24 Right 06/04/24 Right 06/19/24 Right  Right 07/02/24  Hip flexion        Hip extension        Hip abduction        Hip adduction        Hip internal rotation        Hip external rotation        Knee flexion 90 95 98 105 106/ P115 108/P120  Knee extension 0  1 1  1 1   Ankle dorsiflexion        Ankle plantarflexion        Ankle inversion        Ankle eversion         (Blank rows = not tested)  LOWER  EXTREMITY MMT: 4/5 in available ranges    FUNCTIONAL TESTS:  5 times sit to stand: 20s from chair  Timed up and go (TUG): 14s   GAIT: Distance walked: in clinic distances Assistive device utilized: None Level of assistance: Complete Independence Comments: antalgic gait, decreased step length and stance time on RLE                                                                                                                                 TREATMENT DATE:  07/06/24 Bike L2 x43mins  Resisted gait 40# 4 way x4 2 way hip 10# 2x10 HS curls 35lb 2x12 Leg Ext 15lb x12, 20lb x10, RLE 10lb x10 Step ups forward and lateral 6 Static lunge 2x10   07/02/24 NuStep L 5 x4 min Bike L2.5 x 3 min R knee PROM w/ end range holds Patellar mobs STM to surrounding knee structures Three flights of stairs, alt pattern, one rail R Gait behind the back building HS curls 35lb 2x10 Leg Ext 10lb 2x10, RLE 5lb 2x10  06/29/24 Bike L 2.5 x 6 min  Slant board calf stretch  Leg press 70lb 3x10 6in forward & Lateral step ups x10 each HS curls 35lb 2x10 Leg ext 10lb 2x10, SL 5lb x10 each S2S on airex holding yellow ball 2x12  TRX reverse lunges 2x10 each  Squats x10 Mini split squats without UE assist  06/25/24 Bike L 3 x 6 min Leg press 50lb 2x15, SL 20lb 2x5 each R knee PROM w/ end range holds Patellar mobs STM to surrounding knee structures 6in step ups 2x10  40lb resisted gait 4 way x 3 each  Heel raises black bar 2x15 Slant board calf stretch TRX reverse lunges x5 each Bike L 3  x 5 min   06/22/24 Two flights of stairs 1 rail alt pattern,  3 flights descending outside up hill amabulation Leg press 40lb x15, 50lb x15, SL 20lb 2x5 each  6in step ups x10 each  Heel raises 2x15  Sit to stand LE on airex holding 9lb 2x10 R knee PROM w/ end range holds Patellar mobs STM to surrounding knee structures  06/19/24 Nustep L 5 Elliptical 1.5 min each way  20# resisted gait with 6 in  step up 5 x each leg Leg press 40# 2 sets 10 . Rt SL 30# 3 sets 5 PROM/stretching with ROM STM to scar esp proximal HS curl 25# BIL 2 sets 10   SL 15# 10 x Knee ext 10# 2 sets 10 SL 5# 10 x    06/16/24 NuStep L5 x 4 mins LE only Bike L2.5 x3 mins  Gait around the back building in parking lot  2 flights of stairs alt pattern 1 rail Sit to stand holding blue ball 2x10 Leg press single leg 30lb 4x5 each  4 in step ups HS curls 25# 2x10 Leg Ext 10lb 2x10  06/12/24 NuStep L5x92mins LE only Bike L3 x60mins  Calf stretch 20s x3 Recheck ROM- 100d  Leg ext 15# 2x10, RLE 5# x10 HS curls 25# 2x10 Leg press 20# 2x10, RLE no weight just ROM x10 Stairs  Step ups 6   06/10/24 NuStep L5 x 6 min LE only  HS curls 25lb 2x15  Leg Ext 10lb 2x10, RLE 5lb 2x10 Resisted gait 4 way x 4 each Sit to stand LE on airex 2x10  R knee PROM w/ end range holds Patellar mobs STM to surrounding knee structures Slant board calf stretch  06/04/24 NuStep L5 x 6 min LE only  Slant board calf stretch 6in step ups forward & Lateral step ups x10 R knee PROM w/ end range holds Patellar mobs STM to surrounding knee structures  Goals  Sit to stands holding yellow ball 2x15 HS curls 25lb 2x10  Leg Ext 10lb 2x10, RLE 5lb 2x5   06/02/24 NuStep L4 x 6 min LE only Leg press 50lb 2x12 6in forward & lateral step ups x 10 each 4in lateral step ups LLE 2x10  R knee PROM w/ end range holds Patellar mobs STM to surrounding knee structures  Sit to stand holding yellow ball 2x10 HS curls 25lb 2x10  Leg Ext 10lb 2x10   PATIENT EDUCATION:  Education details: POC, HEP, DVT education Person educated: Patient Education method: Explanation Education comprehension: verbalized understanding  HOME EXERCISE PROGRAM: LAQ Seated heel slides  Heel raises  Marching in place Squats   ASSESSMENT:  CLINICAL IMPRESSION: Patient is a 68 y.o. female who was seen today for physical therapy and treatment for R TKA. She  reports ongoing tightness in the knee, had some difficulty with warm up on bike. Weakness on R side noted with 2 way cable kicks, she has a harder time when having to stabilize on the R side. Some increased tremors today. Still compensates with step ups, lateral steps are more difficult especially when having to pushing off the RLE. Patient reports starting body combat again at a modified pace. Has 1 more appt on Thursday, and should be set for d/c.   OBJECTIVE IMPAIRMENTS: Abnormal gait, decreased balance, difficulty walking, decreased ROM, decreased strength, increased edema, and pain.   ACTIVITY LIMITATIONS: sitting, standing, squatting, stairs, and locomotion level  PARTICIPATION LIMITATIONS: cleaning, shopping, community activity, yard work, and gym   REHAB POTENTIAL: Good  CLINICAL DECISION MAKING: Stable/uncomplicated  EVALUATION COMPLEXITY: Low   GOALS: Goals reviewed with patient? Yes   SHORT TERM GOALS: Target date: 07/01/25  Independent with initial HEP. Baseline:  Goal status: Met  05/28/24  2.  Patient will demonstrate improved functional LE strength by completing 5x STS in <15 seconds from chair height.  Baseline: 20s  Goal status:  Met 05/28/24    LONG TERM GOALS: Target date: 08/12/24  Independent with advanced/ongoing HEP to improve outcomes and carryover.  Baseline:  Goal status: 06/19/24 progressing, Met 07/02/24 Doing body combat, Has a Systems analyst.   2.  Gwenneth Whiteman Merlin will demonstrate R knee flexion to 120 deg to ascend/descend stairs. Baseline: 90d Goal status: Progressing 06/04/24, 100d 06/12/24  06/25/24 progressing  3.  Mazelle E Sawaya will be able to ambulate 500' with normal gait pattern to access community.  Baseline:  Goal status: Partly Met 06/04/24  and 06/19/24  4.  Karly E Wissing will be able to ascend/descend stairs with reciprocal step pattern safely to access home and community.  Baseline: step to pattern Goal status: Progressing 05/28/24,  MET w/1HRA 06/12/24  5.  Marsha E Haefele will be able to return to her normal gym routine. (BodyCombat, Zumba, Yoga & working with Systems analyst)  Baseline: has not been back since first TKA Goal status: ongoing, but has been to the gym on her own 06/04/24, been to gym a few times but no classes 06/12/24, 06/25/24 has attended one body combat class. 07/02/24 Partly met body combat twice a week  PLAN:  PT FREQUENCY: 2x/week  PT DURATION: 12 weeks  PLANNED INTERVENTIONS: 97110-Therapeutic exercises, 97530- Therapeutic activity, 97112- Neuromuscular re-education, 97535- Self Care, [id number]- Manual therapy, [phone]- Gait training, [phone] (1-2 muscles), [phone] (3+ muscles)- Dry Needling, Patient/Family education, Balance training, Stair training, Joint mobilization, Scar mobilization, and Cryotherapy  PLAN FOR NEXT SESSION: Recheck goals and discharge, advise to keep working on ROM with bike and on steps    [employer], PT 07/06/2024, 9:31 AM

## 2024-07-02 NOTE — Therapy (Signed)
 OUTPATIENT PHYSICAL THERAPY LOWER EXTREMITY TREATMENT     Patient Name: Sara Nicholson MRN: [id number] DOB:[birth year], 68 y.o., female Today's Date: 07/02/2024  END OF SESSION:  PT End of Session - 07/02/24 0848     Visit Number 14    Date for Recertification  08/12/24    PT Start Time 0845    PT Stop Time 0930    PT Time Calculation (min) 45 min    Activity Tolerance Patient tolerated treatment well    Behavior During Therapy [facility] for tasks assessed/performed           Past Medical History:  Diagnosis Date   Arthritis    Cancer (HCC)    squamous lesion right shoulder   Depression    Hypertension    Neuromuscular disorder (HCC)    Parkinson disease (HCC)    Pneumonia 1988   Past Surgical History:  Procedure Laterality Date   APPENDECTOMY     BREAST EXCISIONAL BIOPSY Left    benign cyst   CESAREAN SECTION     x1   COLONOSCOPY     TOTAL KNEE ARTHROPLASTY Left 01/17/2024   Procedure: ARTHROPLASTY, KNEE, TOTAL;  Surgeon: Vernetta Lonni GRADE, MD;  Location: [facility];  Service: Orthopedics;  Laterality: Left;   TOTAL KNEE ARTHROPLASTY Right 05/01/2024   Procedure: ARTHROPLASTY, KNEE, TOTAL;  Surgeon: Vernetta Lonni GRADE, MD;  Location: [facility];  Service: Orthopedics;  Laterality: Right;   Patient Active Problem List   Diagnosis Date Noted   Status post total right knee replacement 05/01/2024   Status post total left knee replacement 01/17/2024   Unilateral primary osteoarthritis, right knee 01/15/2023   Hyperlipidemia 11/08/2022   Class 2 severe obesity due to excess calories with serious comorbidity and body mass index (BMI) of 37.0 to 37.9 in adult 11/08/2022   Parkinson's disease (HCC) 01/16/2021    PCP: Charmaine Bright  REFERRING PROVIDER: Lonni Vernetta  REFERRING DIAG:  [phone] (ICD-10-CM) - Status post total right knee replacement  M17.11 (ICD-10-CM) - Unilateral primary osteoarthritis, right knee    THERAPY DIAG:  Difficulty in walking,  not elsewhere classified  Stiffness of right knee, not elsewhere classified  Localized edema  Rationale for Evaluation and Treatment: Rehabilitation  ONSET DATE: 05/01/24  SUBJECTIVE:   SUBJECTIVE STATEMENT: I feel good, just tight  PERTINENT HISTORY: Parkinson's  L TKA 01/17/24 R TKA 05/01/24  PAIN:  Are you having pain? No  PRECAUTIONS: Knee  RED FLAGS: None   WEIGHT BEARING RESTRICTIONS: No  FALLS:  Has patient fallen in last 6 months? No  LIVING ENVIRONMENT: Lives with: lives with their spouse Lives in: House/apartment Stairs: Yes: Internal: 13 to get to basement steps; can reach both and External: 3 steps; none and but has a grab Has following equipment at home: Single point cane  OCCUPATION: Retired  PLOF: Independent and Independent with gait  PATIENT GOALS: get back to body combat, zumba and anything else I can get into   NEXT MD VISIT: September   OBJECTIVE:  Note: Objective measures were completed at Evaluation unless otherwise noted.  DIAGNOSTIC FINDINGS: Right knee arthroplasty. No periprosthetic fracture or acute hardware complication. Anterior surgical staples. Subcutaneous edema and gas anteriorly.   IMPRESSION: Expected appearance after right knee arthroplasty.   COGNITION: Overall cognitive status: Within functional limits for tasks assessed     SENSATION: WFL  EDEMA:  Swelling in R knee  PALPATION: Increased swelling, some TTP, warm around scar   LOWER EXTREMITY ROM:  Active ROM Right  eval Right 05/28/24 Right 06/04/24 Right 06/19/24 Right  Right 07/02/24  Hip flexion        Hip extension        Hip abduction        Hip adduction        Hip internal rotation        Hip external rotation        Knee flexion 90 95 98 105 106/ P115 108/P120  Knee extension 0  1 1  1 1   Ankle dorsiflexion        Ankle plantarflexion        Ankle inversion        Ankle eversion         (Blank rows = not tested)  LOWER EXTREMITY MMT: 4/5 in  available ranges    FUNCTIONAL TESTS:  5 times sit to stand: 20s from chair  Timed up and go (TUG): 14s   GAIT: Distance walked: in clinic distances Assistive device utilized: None Level of assistance: Complete Independence Comments: antalgic gait, decreased step length and stance time on RLE                                                                                                                                 TREATMENT DATE:  07/02/24 NuStep L 5 x4 min Bike L2.5 x 3 min R knee PROM w/ end range holds Patellar mobs STM to surrounding knee structures Three flights of stairs, alt pattern, one rail R Gait behind the back building HS curls 35lb 2x10 Leg Ext 10lb 2x10, RLE 5lb 2x10  06/29/24 Bike L 2.5 x 6 min  Slant board calf stretch  Leg press 70lb 3x10 6in forward & Lateral step ups x10 each HS curls 35lb 2x10 Leg ext 10lb 2x10, SL 5lb x10 each S2S on airex holding yellow ball 2x12  TRX reverse lunges 2x10 each  Squats x10 Mini split squats without UE assist  06/25/24 Bike L 3 x 6 min Leg press 50lb 2x15, SL 20lb 2x5 each R knee PROM w/ end range holds Patellar mobs STM to surrounding knee structures 6in step ups 2x10  40lb resisted gait 4 way x 3 each  Heel raises black bar 2x15 Slant board calf stretch TRX reverse lunges x5 each Bike L 3  x 5 min   06/22/24 Two flights of stairs 1 rail alt pattern,  3 flights descending outside up hill amabulation Leg press 40lb x15, 50lb x15, SL 20lb 2x5 each  6in step ups x10 each  Heel raises 2x15  Sit to stand LE on airex holding 9lb 2x10 R knee PROM w/ end range holds Patellar mobs STM to surrounding knee structures   06/19/24 Nustep L 5 Elliptical 1.5 min each way  20# resisted gait with 6 in step up 5 x each leg Leg press 40# 2 sets 10 . Rt SL 30# 3 sets 5 PROM/stretching with ROM STM to  scar esp proximal HS curl 25# BIL 2 sets 10   SL 15# 10 x Knee ext 10# 2 sets 10 SL 5# 10  x    06/16/24 NuStep L5 x 4 mins LE only Bike L2.5 x3 mins  Gait around the back building in parking lot  2 flights of stairs alt pattern 1 rail Sit to stand holding blue ball 2x10 Leg press single leg 30lb 4x5 each  4 in step ups HS curls 25# 2x10 Leg Ext 10lb 2x10  06/12/24 NuStep L5x20mins LE only Bike L3 x59mins  Calf stretch 20s x3 Recheck ROM- 100d  Leg ext 15# 2x10, RLE 5# x10 HS curls 25# 2x10 Leg press 20# 2x10, RLE no weight just ROM x10 Stairs  Step ups 6   06/10/24 NuStep L5 x 6 min LE only  HS curls 25lb 2x15  Leg Ext 10lb 2x10, RLE 5lb 2x10 Resisted gait 4 way x 4 each Sit to stand LE on airex 2x10  R knee PROM w/ end range holds Patellar mobs STM to surrounding knee structures Slant board calf stretch  06/04/24 NuStep L5 x 6 min LE only  Slant board calf stretch 6in step ups forward & Lateral step ups x10 R knee PROM w/ end range holds Patellar mobs STM to surrounding knee structures  Goals  Sit to stands holding yellow ball 2x15 HS curls 25lb 2x10  Leg Ext 10lb 2x10, RLE 5lb 2x5   06/02/24 NuStep L4 x 6 min LE only Leg press 50lb 2x12 6in forward & lateral step ups x 10 each 4in lateral step ups LLE 2x10  R knee PROM w/ end range holds Patellar mobs STM to surrounding knee structures  Sit to stand holding yellow ball 2x10 HS curls 25lb 2x10  Leg Ext 10lb 2x10   PATIENT EDUCATION:  Education details: POC, HEP, DVT education Person educated: Patient Education method: Explanation Education comprehension: verbalized understanding  HOME EXERCISE PROGRAM: LAQ Seated heel slides  Heel raises  Marching in place Squats   ASSESSMENT:  CLINICAL IMPRESSION: Patient is a 68 y.o. female who was seen today for physical therapy and treatment for R TKA. She enters doing well and is progressing towards goals.  Pt again demo functional weakness w / both LE doing step ups.  R knee pain when descending stairs. Fatigue and decrease gait velocity with  uphill ambulation.  Patient will benefit from PT to return to her normal gym routine which included Body Combat, Zumba, and working with a personal training 5x a week.   OBJECTIVE IMPAIRMENTS: Abnormal gait, decreased balance, difficulty walking, decreased ROM, decreased strength, increased edema, and pain.   ACTIVITY LIMITATIONS: sitting, standing, squatting, stairs, and locomotion level  PARTICIPATION LIMITATIONS: cleaning, shopping, community activity, yard work, and gym   REHAB POTENTIAL: Good  CLINICAL DECISION MAKING: Stable/uncomplicated  EVALUATION COMPLEXITY: Low   GOALS: Goals reviewed with patient? Yes   SHORT TERM GOALS: Target date: 07/01/25  Independent with initial HEP. Baseline:  Goal status: Met  05/28/24  2.  Patient will demonstrate improved functional LE strength by completing 5x STS in <15 seconds from chair height.  Baseline: 20s  Goal status: Met 05/28/24    LONG TERM GOALS: Target date: 08/12/24  Independent with advanced/ongoing HEP to improve outcomes and carryover.  Baseline:  Goal status: 06/19/24 progressing, Met 07/02/24 Doing body combat, Has a Systems analyst.   2.  Reva Pinkley Borski will demonstrate R knee flexion to 120 deg to ascend/descend stairs. Baseline: 90d Goal status:  Progressing 06/04/24, 100d 06/12/24  06/25/24 progressing  3.  Marye E Nicholls will be able to ambulate 500' with normal gait pattern to access community.  Baseline:  Goal status: Partly Met 06/04/24  and 06/19/24  4.  Keiasia E Dotson will be able to ascend/descend stairs with reciprocal step pattern safely to access home and community.  Baseline: step to pattern Goal status: Progressing 05/28/24, MET w/1HRA 06/12/24  5.  Athalene E Weil will be able to return to her normal gym routine. (BodyCombat, Zumba, Yoga & working with Systems analyst)  Baseline: has not been back since first TKA Goal status: ongoing, but has been to the gym on her own 06/04/24, been to gym a few times but  no classes 06/12/24, 06/25/24 has attended one body combat class. 07/02/24 Partly met body combat twice a week  PLAN:  PT FREQUENCY: 2x/week  PT DURATION: 12 weeks  PLANNED INTERVENTIONS: 97110-Therapeutic exercises, 97530- Therapeutic activity, 97112- Neuromuscular re-education, 97535- Self Care, [id number]- Manual therapy, [phone]- Gait training, [phone] (1-2 muscles), [phone] (3+ muscles)- Dry Needling, Patient/Family education, Balance training, Stair training, Joint mobilization, Scar mobilization, and Cryotherapy  PLAN FOR NEXT SESSION: R knee PROM, functional tasks, R knee strengthening    Tanda KANDICE Sorrow, PTA 07/02/2024, 8:49 AM

## 2024-07-06 ENCOUNTER — Ambulatory Visit

## 2024-07-06 DIAGNOSIS — R262 Difficulty in walking, not elsewhere classified: Secondary | ICD-10-CM | POA: Diagnosis not present

## 2024-07-06 DIAGNOSIS — M25661 Stiffness of right knee, not elsewhere classified: Secondary | ICD-10-CM | POA: Diagnosis not present

## 2024-07-06 DIAGNOSIS — Z96651 Presence of right artificial knee joint: Secondary | ICD-10-CM

## 2024-07-06 DIAGNOSIS — M25562 Pain in left knee: Secondary | ICD-10-CM | POA: Diagnosis not present

## 2024-07-06 DIAGNOSIS — M25662 Stiffness of left knee, not elsewhere classified: Secondary | ICD-10-CM | POA: Diagnosis not present

## 2024-07-06 DIAGNOSIS — R600 Localized edema: Secondary | ICD-10-CM | POA: Diagnosis not present

## 2024-07-07 DIAGNOSIS — G20A1 Parkinson's disease without dyskinesia, without mention of fluctuations: Secondary | ICD-10-CM | POA: Diagnosis not present

## 2024-07-07 DIAGNOSIS — M1712 Unilateral primary osteoarthritis, left knee: Secondary | ICD-10-CM | POA: Diagnosis not present

## 2024-07-09 ENCOUNTER — Encounter: Payer: Self-pay | Admitting: Physical Therapy

## 2024-07-09 ENCOUNTER — Ambulatory Visit: Admitting: Physical Therapy

## 2024-07-09 DIAGNOSIS — Z96651 Presence of right artificial knee joint: Secondary | ICD-10-CM | POA: Insufficient documentation

## 2024-07-09 DIAGNOSIS — R262 Difficulty in walking, not elsewhere classified: Secondary | ICD-10-CM | POA: Insufficient documentation

## 2024-07-09 DIAGNOSIS — M25661 Stiffness of right knee, not elsewhere classified: Secondary | ICD-10-CM | POA: Insufficient documentation

## 2024-07-09 DIAGNOSIS — R600 Localized edema: Secondary | ICD-10-CM | POA: Insufficient documentation

## 2024-07-09 NOTE — Therapy (Signed)
 OUTPATIENT PHYSICAL THERAPY LOWER EXTREMITY TREATMENT     Patient Name: Sara Nicholson MRN: [id number] DOB:21-Dec-1955, 68 y.o., female Today's Date: 07/09/2024  END OF SESSION:  PT End of Session - 07/09/24 0854     Visit Number 16    Date for Recertification  08/12/24    PT Start Time 0854    PT Stop Time 0930    PT Time Calculation (min) 36 min    Activity Tolerance Patient tolerated treatment well    Behavior During Therapy Sara Nicholson for tasks assessed/performed            Past Medical History:  Diagnosis Date   Arthritis    Cancer (HCC)    squamous lesion right shoulder   Depression    Hypertension    Neuromuscular disorder (HCC)    Parkinson disease (HCC)    Pneumonia 1988   Past Surgical History:  Procedure Laterality Date   APPENDECTOMY     BREAST EXCISIONAL BIOPSY Left    benign cyst   CESAREAN SECTION     x1   COLONOSCOPY     TOTAL KNEE ARTHROPLASTY Left 01/17/2024   Procedure: ARTHROPLASTY, KNEE, TOTAL;  Surgeon: Vernetta Lonni GRADE, MD;  Location: [facility];  Service: Orthopedics;  Laterality: Left;   TOTAL KNEE ARTHROPLASTY Right 05/01/2024   Procedure: ARTHROPLASTY, KNEE, TOTAL;  Surgeon: Vernetta Lonni GRADE, MD;  Location: [facility];  Service: Orthopedics;  Laterality: Right;   Patient Active Problem List   Diagnosis Date Noted   Status post total right knee replacement 05/01/2024   Status post total left knee replacement 01/17/2024   Unilateral primary osteoarthritis, right knee 01/15/2023   Hyperlipidemia 11/08/2022   Class 2 severe obesity due to excess calories with serious comorbidity and body mass index (BMI) of 37.0 to 37.9 in adult 11/08/2022   Parkinson's disease (HCC) 01/16/2021    PCP: Charmaine Bright  REFERRING PROVIDER: Lonni Vernetta  REFERRING DIAG:  [id number] (ICD-10-CM) - Status post total right knee replacement  M17.11 (ICD-10-CM) - Unilateral primary osteoarthritis, right knee    THERAPY DIAG:  Stiffness of right  knee, not elsewhere classified  Difficulty in walking, not elsewhere classified  S/P total knee arthroplasty, right  Localized edema  Rationale for Evaluation and Treatment: Rehabilitation  ONSET DATE: 05/01/24  SUBJECTIVE:   SUBJECTIVE STATEMENT: Yesterday afternoon, stepped over the dog, thought the dog was going to get up and took a bigger step than she intended. Thinks she may have cam down on it too hard. Now she is experiencing increase pain  PERTINENT HISTORY: Parkinson's  L TKA 01/17/24 R TKA 05/01/24  PAIN:  Are you having pain? 7/10 R knee  PRECAUTIONS: Knee  RED FLAGS: None   WEIGHT BEARING RESTRICTIONS: No  FALLS:  Has patient fallen in last 6 months? No  LIVING ENVIRONMENT: Lives with: lives with their spouse Lives in: House/apartment Stairs: Yes: Internal: 13 to get to basement steps; can reach both and External: 3 steps; none and but has a grab Has following equipment at home: Single point cane  OCCUPATION: Retired  PLOF: Independent and Independent with gait  PATIENT GOALS: get back to body combat, zumba and anything else I can get into   NEXT MD VISIT: September   OBJECTIVE:  Note: Objective measures were completed at Evaluation unless otherwise noted.  DIAGNOSTIC FINDINGS: Right knee arthroplasty. No periprosthetic fracture or acute hardware complication. Anterior surgical staples. Subcutaneous edema and gas anteriorly.   IMPRESSION: Expected appearance after right knee arthroplasty.  COGNITION: Overall cognitive status: Within functional limits for tasks assessed     SENSATION: WFL  EDEMA:  Swelling in R knee  PALPATION: Increased swelling, some TTP, warm around scar   LOWER EXTREMITY ROM:  Active ROM Right eval Right 05/28/24 Right 06/04/24 Right 06/19/24 Right  Right 07/02/24  Hip flexion        Hip extension        Hip abduction        Hip adduction        Hip internal rotation        Hip external rotation        Knee  flexion 90 95 98 105 106/ P115 108/P120  Knee extension 0  1 1  1 1   Ankle dorsiflexion        Ankle plantarflexion        Ankle inversion        Ankle eversion         (Blank rows = not tested)  LOWER EXTREMITY MMT: 4/5 in available ranges    FUNCTIONAL TESTS:  5 times sit to stand: 20s from chair  Timed up and go (TUG): 14s   GAIT: Distance walked: in clinic distances Assistive device utilized: None Level of assistance: Complete Independence Comments: antalgic gait, decreased step length and stance time on RLE                                                                                                                                 TREATMENT DATE:  07/09/24 Attempted light patellar mobs and PROM  but pt unable to tolerated  07/06/24 Bike L2 x107mins  Resisted gait 40# 4 way x4 2 way hip 10# 2x10 HS curls 35lb 2x12 Leg Ext 15lb x12, 20lb x10, RLE 10lb x10 Step ups forward and lateral 6 Static lunge 2x10   07/02/24 NuStep L 5 x4 min Bike L2.5 x 3 min R knee PROM w/ end range holds Patellar mobs STM to surrounding knee structures Three flights of stairs, alt pattern, one rail R Gait behind the back building HS curls 35lb 2x10 Leg Ext 10lb 2x10, RLE 5lb 2x10  06/29/24 Bike L 2.5 x 6 min  Slant board calf stretch  Leg press 70lb 3x10 6in forward & Lateral step ups x10 each HS curls 35lb 2x10 Leg ext 10lb 2x10, SL 5lb x10 each S2S on airex holding yellow ball 2x12  TRX reverse lunges 2x10 each  Squats x10 Mini split squats without UE assist  06/25/24 Bike L 3 x 6 min Leg press 50lb 2x15, SL 20lb 2x5 each R knee PROM w/ end range holds Patellar mobs STM to surrounding knee structures 6in step ups 2x10  40lb resisted gait 4 way x 3 each  Heel raises black bar 2x15 Slant board calf stretch TRX reverse lunges x5 each Bike L 3  x 5 min   06/22/24 Two flights of stairs 1 rail alt pattern,  3 flights descending outside up hill amabulation Leg press 40lb  x15, 50lb x15, SL 20lb 2x5 each  6in step ups x10 each  Heel raises 2x15  Sit to stand LE on airex holding 9lb 2x10 R knee PROM w/ end range holds Patellar mobs STM to surrounding knee structures   06/19/24 Nustep L 5 Elliptical 1.5 min each way  20# resisted gait with 6 in step up 5 x each leg Leg press 40# 2 sets 10 . Rt SL 30# 3 sets 5 PROM/stretching with ROM STM to scar esp proximal HS curl 25# BIL 2 sets 10   SL 15# 10 x Knee ext 10# 2 sets 10 SL 5# 10 x    06/16/24 NuStep L5 x 4 mins LE only Bike L2.5 x3 mins  Gait around the back building in parking lot  2 flights of stairs alt pattern 1 rail Sit to stand holding blue ball 2x10 Leg press single leg 30lb 4x5 each  4 in step ups HS curls 25# 2x10 Leg Ext 10lb 2x10  06/12/24 NuStep L5x49mins LE only Bike L3 x12mins  Calf stretch 20s x3 Recheck ROM- 100d  Leg ext 15# 2x10, RLE 5# x10 HS curls 25# 2x10 Leg press 20# 2x10, RLE no weight just ROM x10 Stairs  Step ups 6   06/10/24 NuStep L5 x 6 min LE only  HS curls 25lb 2x15  Leg Ext 10lb 2x10, RLE 5lb 2x10 Resisted gait 4 way x 4 each Sit to stand LE on airex 2x10  R knee PROM w/ end range holds Patellar mobs STM to surrounding knee structures Slant board calf stretch  06/04/24 NuStep L5 x 6 min LE only  Slant board calf stretch 6in step ups forward & Lateral step ups x10 R knee PROM w/ end range holds Patellar mobs STM to surrounding knee structures  Goals  Sit to stands holding yellow ball 2x15 HS curls 25lb 2x10  Leg Ext 10lb 2x10, RLE 5lb 2x5   06/02/24 NuStep L4 x 6 min LE only Leg press 50lb 2x12 6in forward & lateral step ups x 10 each 4in lateral step ups LLE 2x10  R knee PROM w/ end range holds Patellar mobs STM to surrounding knee structures  Sit to stand holding yellow ball 2x10 HS curls 25lb 2x10  Leg Ext 10lb 2x10   PATIENT EDUCATION:  Education details: POC, HEP, DVT education Person educated: Patient Education method:  Explanation Education comprehension: verbalized understanding  HOME EXERCISE PROGRAM: LAQ Seated heel slides  Heel raises  Marching in place Squats   ASSESSMENT:  CLINICAL IMPRESSION: Patient is a 68 y.o. female who was seen today for physical therapy and treatment for R TKA. She enters with increase pain unable to tolerate therapy. Wont charge for today's visit. Advised pt to schedule last visit late nest week. If no improvement made in the next couple days advised pt to contact MD and schedule last therapy session after the MD appointment.    OBJECTIVE IMPAIRMENTS: Abnormal gait, decreased balance, difficulty walking, decreased ROM, decreased strength, increased edema, and pain.   ACTIVITY LIMITATIONS: sitting, standing, squatting, stairs, and locomotion level  PARTICIPATION LIMITATIONS: cleaning, shopping, community activity, yard work, and gym   REHAB POTENTIAL: Good  CLINICAL DECISION MAKING: Stable/uncomplicated  EVALUATION COMPLEXITY: Low   GOALS: Goals reviewed with patient? Yes   SHORT TERM GOALS: Target date: 07/01/25  Independent with initial HEP. Baseline:  Goal status: Met  05/28/24  2.  Patient will demonstrate improved functional  LE strength by completing 5x STS in <15 seconds from chair height.  Baseline: 20s  Goal status: Met 05/28/24    LONG TERM GOALS: Target date: 08/12/24  Independent with advanced/ongoing HEP to improve outcomes and carryover.  Baseline:  Goal status: 06/19/24 progressing, Met 07/02/24 Doing body combat, Has a Systems analyst.   2.  Modell Fendrick Lybeck will demonstrate R knee flexion to 120 deg to ascend/descend stairs. Baseline: 90d Goal status: Progressing 06/04/24, 100d 06/12/24  06/25/24 progressing  3.  Kathrene E Albergo will be able to ambulate 500' with normal gait pattern to access community.  Baseline:  Goal status: Partly Met 06/04/24  and 06/19/24  4.  Ameila E Guerin will be able to ascend/descend stairs with reciprocal step  pattern safely to access home and community.  Baseline: step to pattern Goal status: Progressing 05/28/24, MET w/1HRA 06/12/24  5.  Tanai E Winegarden will be able to return to her normal gym routine. (BodyCombat, Zumba, Yoga & working with Systems analyst)  Baseline: has not been back since first TKA Goal status: ongoing, but has been to the gym on her own 06/04/24, been to gym a few times but no classes 06/12/24, 06/25/24 has attended one body combat class. 07/02/24 Partly met body combat twice a week  PLAN:  PT FREQUENCY: 2x/week  PT DURATION: 12 weeks  PLANNED INTERVENTIONS: 97110-Therapeutic exercises, 97530- Therapeutic activity, 97112- Neuromuscular re-education, 97535- Self Care, 02859- Manual therapy, [phone]- Gait training, [phone] (1-2 muscles), [phone] (3+ muscles)- Dry Needling, Patient/Family education, Balance training, Stair training, Joint mobilization, Scar mobilization, and Cryotherapy  PLAN FOR NEXT SESSION: Access pt, progress as tolerated    Tanda KANDICE Sorrow, PTA 07/09/2024, 8:56 AM

## 2024-07-16 ENCOUNTER — Ambulatory Visit: Attending: Orthopaedic Surgery | Admitting: Physical Therapy

## 2024-07-16 ENCOUNTER — Encounter: Payer: Self-pay | Admitting: Physical Therapy

## 2024-07-16 DIAGNOSIS — R600 Localized edema: Secondary | ICD-10-CM

## 2024-07-16 DIAGNOSIS — R262 Difficulty in walking, not elsewhere classified: Secondary | ICD-10-CM | POA: Diagnosis not present

## 2024-07-16 DIAGNOSIS — Z96651 Presence of right artificial knee joint: Secondary | ICD-10-CM

## 2024-07-16 DIAGNOSIS — M25661 Stiffness of right knee, not elsewhere classified: Secondary | ICD-10-CM

## 2024-07-16 NOTE — Therapy (Signed)
 OUTPATIENT PHYSICAL THERAPY LOWER EXTREMITY TREATMENT     Patient Name: CAROLENE GITTO MRN: [id number] DOB:1955-11-08, 68 y.o., female Today's Date: 07/16/2024  END OF SESSION:  PT End of Session - 07/16/24 1346     Visit Number 16    Date for Recertification  08/12/24    PT Start Time 1345    PT Stop Time 1430    PT Time Calculation (min) 45 min    Activity Tolerance Patient tolerated treatment well    Behavior During Therapy WFL for tasks assessed/performed            Past Medical History:  Diagnosis Date   Arthritis    Cancer (HCC)    squamous lesion right shoulder   Depression    Hypertension    Neuromuscular disorder (HCC)    Parkinson disease (HCC)    Pneumonia 1988   Past Surgical History:  Procedure Laterality Date   APPENDECTOMY     BREAST EXCISIONAL BIOPSY Left    benign cyst   CESAREAN SECTION     x1   COLONOSCOPY     TOTAL KNEE ARTHROPLASTY Left 01/17/2024   Procedure: ARTHROPLASTY, KNEE, TOTAL;  Surgeon: Vernetta Lonni GRADE, MD;  Location: [facility];  Service: Orthopedics;  Laterality: Left;   TOTAL KNEE ARTHROPLASTY Right 05/01/2024   Procedure: ARTHROPLASTY, KNEE, TOTAL;  Surgeon: Vernetta Lonni GRADE, MD;  Location: [facility];  Service: Orthopedics;  Laterality: Right;   Patient Active Problem List   Diagnosis Date Noted   Status post total right knee replacement 05/01/2024   Status post total left knee replacement 01/17/2024   Unilateral primary osteoarthritis, right knee 01/15/2023   Hyperlipidemia 11/08/2022   Class 2 severe obesity due to excess calories with serious comorbidity and body mass index (BMI) of 37.0 to 37.9 in adult 11/08/2022   Parkinson's disease (HCC) 01/16/2021    PCP: Charmaine Bright  REFERRING PROVIDER: Lonni Vernetta  REFERRING DIAG:  [phone] (ICD-10-CM) - Status post total right knee replacement  M17.11 (ICD-10-CM) - Unilateral primary osteoarthritis, right knee    THERAPY DIAG:  Stiffness of right  knee, not elsewhere classified  Difficulty in walking, not elsewhere classified  S/P total knee arthroplasty, right  Localized edema  Rationale for Evaluation and Treatment: Rehabilitation  ONSET DATE: 05/01/24  SUBJECTIVE:   SUBJECTIVE STATEMENT: Doing a lot better  PERTINENT HISTORY: Parkinson's  L TKA 01/17/24 R TKA 05/01/24  PAIN:  Are you having pain? 0/10 R knee  PRECAUTIONS: Knee  RED FLAGS: None   WEIGHT BEARING RESTRICTIONS: No  FALLS:  Has patient fallen in last 6 months? No  LIVING ENVIRONMENT: Lives with: lives with their spouse Lives in: House/apartment Stairs: Yes: Internal: 13 to get to basement steps; can reach both and External: 3 steps; none and but has a grab Has following equipment at home: Single point cane  OCCUPATION: Retired  PLOF: Independent and Independent with gait  PATIENT GOALS: get back to body combat, zumba and anything else I can get into   NEXT MD VISIT: September   OBJECTIVE:  Note: Objective measures were completed at Evaluation unless otherwise noted.  DIAGNOSTIC FINDINGS: Right knee arthroplasty. No periprosthetic fracture or acute hardware complication. Anterior surgical staples. Subcutaneous edema and gas anteriorly.   IMPRESSION: Expected appearance after right knee arthroplasty.   COGNITION: Overall cognitive status: Within functional limits for tasks assessed     SENSATION: WFL  EDEMA:  Swelling in R knee  PALPATION: Increased swelling, some TTP, warm around scar  LOWER EXTREMITY ROM:  Active ROM Right eval Right 05/28/24 Right 06/04/24 Right 06/19/24 Right  Right 07/02/24 Right 07/16/24  Hip flexion         Hip extension         Hip abduction         Hip adduction         Hip internal rotation         Hip external rotation         Knee flexion 90 95 98 105 106/ P115 108/P120 100  Knee extension 0  1 1  1 1 1   Ankle dorsiflexion         Ankle plantarflexion         Ankle inversion         Ankle  eversion          (Blank rows = not tested)  LOWER EXTREMITY MMT: 4/5 in available ranges    FUNCTIONAL TESTS:  5 times sit to stand: 20s from chair  Timed up and go (TUG): 14s   GAIT: Distance walked: in clinic distances Assistive device utilized: None Level of assistance: Complete Independence Comments: antalgic gait, decreased step length and stance time on RLE                                                                                                                                 TREATMENT DATE:  07/16/24 NuStep L5 x 6 min 30lb resisted gait 4 way x 4 each Forward and lateral 6 in step ups x10 Heel raises 2x15 Leg press 40lb 2x12 R knee PROM w/ end range holds Patellar mobs STM to surrounding knee structures  07/09/24 Attempted light patellar mobs and PROM  but pt unable to tolerated  07/06/24 Bike L2 x32mins  Resisted gait 40# 4 way x4 2 way hip 10# 2x10 HS curls 35lb 2x12 Leg Ext 15lb x12, 20lb x10, RLE 10lb x10 Step ups forward and lateral 6 Static lunge 2x10   07/02/24 NuStep L 5 x4 min Bike L2.5 x 3 min R knee PROM w/ end range holds Patellar mobs STM to surrounding knee structures Three flights of stairs, alt pattern, one rail R Gait behind the back building HS curls 35lb 2x10 Leg Ext 10lb 2x10, RLE 5lb 2x10  06/29/24 Bike L 2.5 x 6 min  Slant board calf stretch  Leg press 70lb 3x10 6in forward & Lateral step ups x10 each HS curls 35lb 2x10 Leg ext 10lb 2x10, SL 5lb x10 each S2S on airex holding yellow ball 2x12  TRX reverse lunges 2x10 each  Squats x10 Mini split squats without UE assist  06/25/24 Bike L 3 x 6 min Leg press 50lb 2x15, SL 20lb 2x5 each R knee PROM w/ end range holds Patellar mobs STM to surrounding knee structures 6in step ups 2x10  40lb resisted gait 4 way x 3 each  Heel raises black bar 2x15 Slant board calf  stretch TRX reverse lunges x5 each Bike L 3  x 5 min   06/22/24 Two flights of stairs 1 rail alt  pattern,  3 flights descending outside up hill amabulation Leg press 40lb x15, 50lb x15, SL 20lb 2x5 each  6in step ups x10 each  Heel raises 2x15  Sit to stand LE on airex holding 9lb 2x10 R knee PROM w/ end range holds Patellar mobs STM to surrounding knee structures   06/19/24 Nustep L 5 Elliptical 1.5 min each way  20# resisted gait with 6 in step up 5 x each leg Leg press 40# 2 sets 10 . Rt SL 30# 3 sets 5 PROM/stretching with ROM STM to scar esp proximal HS curl 25# BIL 2 sets 10   SL 15# 10 x Knee ext 10# 2 sets 10 SL 5# 10 x    06/16/24 NuStep L5 x 4 mins LE only Bike L2.5 x3 mins  Gait around the back building in parking lot  2 flights of stairs alt pattern 1 rail Sit to stand holding blue ball 2x10 Leg press single leg 30lb 4x5 each  4 in step ups HS curls 25# 2x10 Leg Ext 10lb 2x10  06/12/24 NuStep L5x34mins LE only Bike L3 x71mins  Calf stretch 20s x3 Recheck ROM- 100d  Leg ext 15# 2x10, RLE 5# x10 HS curls 25# 2x10 Leg press 20# 2x10, RLE no weight just ROM x10 Stairs  Step ups 6   06/10/24 NuStep L5 x 6 min LE only  HS curls 25lb 2x15  Leg Ext 10lb 2x10, RLE 5lb 2x10 Resisted gait 4 way x 4 each Sit to stand LE on airex 2x10  R knee PROM w/ end range holds Patellar mobs STM to surrounding knee structures Slant board calf stretch  06/04/24 NuStep L5 x 6 min LE only  Slant board calf stretch 6in step ups forward & Lateral step ups x10 R knee PROM w/ end range holds Patellar mobs STM to surrounding knee structures  Goals  Sit to stands holding yellow ball 2x15 HS curls 25lb 2x10  Leg Ext 10lb 2x10, RLE 5lb 2x5   06/02/24 NuStep L4 x 6 min LE only Leg press 50lb 2x12 6in forward & lateral step ups x 10 each 4in lateral step ups LLE 2x10  R knee PROM w/ end range holds Patellar mobs STM to surrounding knee structures  Sit to stand holding yellow ball 2x10 HS curls 25lb 2x10  Leg Ext 10lb 2x10   PATIENT EDUCATION:  Education  details: POC, HEP, DVT education Person educated: Patient Education method: Explanation Education comprehension: verbalized understanding  HOME EXERCISE PROGRAM: LAQ Seated heel slides  Heel raises  Marching in place Squats   ASSESSMENT:  CLINICAL IMPRESSION: Patient is a 68 y.o. female who was seen today for physical therapy and treatment for R TKA. She enters doing well and has met most goals. She has a Systems analyst and will be attending body combat classes. She is pleased with her current functional status.    OBJECTIVE IMPAIRMENTS: Abnormal gait, decreased balance, difficulty walking, decreased ROM, decreased strength, increased edema, and pain.   ACTIVITY LIMITATIONS: sitting, standing, squatting, stairs, and locomotion level  PARTICIPATION LIMITATIONS: cleaning, shopping, community activity, yard work, and gym   REHAB POTENTIAL: Good  CLINICAL DECISION MAKING: Stable/uncomplicated  EVALUATION COMPLEXITY: Low   GOALS: Goals reviewed with patient? Yes   SHORT TERM GOALS: Target date: 07/01/25  Independent with initial HEP. Baseline:  Goal status: Met  05/28/24  2.  Patient will demonstrate improved functional LE strength by completing 5x STS in <15 seconds from chair height.  Baseline: 20s  Goal status: Met 05/28/24    LONG TERM GOALS: Target date: 08/12/24  Independent with advanced/ongoing HEP to improve outcomes and carryover.  Baseline:  Goal status: 06/19/24 progressing, Met 07/02/24 Doing body combat, Has a Systems analyst.   2.  Jorja Empie Winzer will demonstrate R knee flexion to 120 deg to ascend/descend stairs. Baseline: 90d Goal status: Progressing 06/04/24, 100d 06/12/24  06/25/24 progressing  3.  Monchel E Ndiaye will be able to ambulate 500' with normal gait pattern to access community.  Baseline:  Goal status: Partly Met 06/04/24  and 06/19/24, Met 07/16/24  4.  Yulia E Kijowski will be able to ascend/descend stairs with reciprocal step pattern  safely to access home and community.  Baseline: step to pattern Goal status: Progressing 05/28/24, MET w/1HRA 06/12/24  5.  Nickolette E Halley will be able to return to her normal gym routine. (BodyCombat, Zumba, Yoga & working with Systems analyst)  Baseline: has not been back since first TKA Goal status: ongoing, but has been to the gym on her own 06/04/24, been to gym a few times but no classes 06/12/24, 06/25/24 has attended one body combat class. 07/02/24 Partly met body combat twice a week  PLAN:  PT FREQUENCY: 2x/week  PT DURATION: 12 weeks  PLANNED INTERVENTIONS: 97110-Therapeutic exercises, 97530- Therapeutic activity, 97112- Neuromuscular re-education, 97535- Self Care, 02859- Manual therapy, [phone]- Gait training, [phone] (1-2 muscles), [phone] (3+ muscles)- Dry Needling, Patient/Family education, Balance training, Stair training, Joint mobilization, Scar mobilization, and Cryotherapy  PLAN FOR NEXT SESSION: Access pt, progress as tolerated   PHYSICAL THERAPY DISCHARGE SUMMARY  Visits from Start of Care: 16 Patient agrees to discharge. Patient goals were partially met. Patient is being discharged due to being pleased with the current functional level.  Tanda KANDICE Sorrow, PTA 07/16/2024, 1:46 PM

## 2024-08-05 ENCOUNTER — Ambulatory Visit: Admitting: Family Medicine

## 2024-08-05 ENCOUNTER — Encounter: Payer: Self-pay | Admitting: Family Medicine

## 2024-08-05 DIAGNOSIS — E66812 Obesity, class 2: Secondary | ICD-10-CM

## 2024-08-05 DIAGNOSIS — G20B2 Parkinson's disease with dyskinesia, with fluctuations: Secondary | ICD-10-CM

## 2024-08-05 DIAGNOSIS — F419 Anxiety disorder, unspecified: Secondary | ICD-10-CM | POA: Insufficient documentation

## 2024-08-05 DIAGNOSIS — R030 Elevated blood-pressure reading, without diagnosis of hypertension: Secondary | ICD-10-CM | POA: Diagnosis not present

## 2024-08-05 DIAGNOSIS — Z6837 Body mass index (BMI) 37.0-37.9, adult: Secondary | ICD-10-CM

## 2024-08-05 DIAGNOSIS — E7800 Pure hypercholesterolemia, unspecified: Secondary | ICD-10-CM

## 2024-08-05 DIAGNOSIS — D649 Anemia, unspecified: Secondary | ICD-10-CM

## 2024-08-05 MED ORDER — ESCITALOPRAM OXALATE 20 MG PO TABS: 20.0000 mg | ORAL_TABLET | Freq: Every day | ORAL | 3 refills | Status: AC

## 2024-08-05 NOTE — Assessment & Plan Note (Signed)
 Uncontrolled.  ASCVD risk or is below.  Patient wants to continue with lifestyle changes. The 10-year ASCVD risk score (Arnett DK, et al., 2019) is: 8.3%   Values used to calculate the score:     Age: 69 years     Clincally relevant sex: Female     Is Non-Hispanic African American: No     Diabetic: No     Tobacco smoker: No     Systolic Blood Pressure: 132 mmHg     Is BP treated: No     HDL Cholesterol: 64.6 mg/dL     Total Cholesterol: 239 mg/dL

## 2024-08-05 NOTE — Patient Instructions (Signed)
Labs today.  Follow up in 6 months to 1 year.

## 2024-08-05 NOTE — Assessment & Plan Note (Signed)
 We discussed medication options.  Patient wants to continue with diet and lifestyle changes at this time.

## 2024-08-05 NOTE — Progress Notes (Signed)
 Subjective:  Patient ID: Sara Nicholson, female    DOB: Nov 01, 1955  Age: 68 y.o. MRN: [id number]  CC:   Chief Complaint  Patient presents with   Establish Care    HPI:  68 year old female with Parkinson's disease, hyperlipidemia, obesity presents to establish care.  Patient overall doing well at this time.  She is still recovering from recent knee replacement.  She has completed physical therapy.  Patient follows with Dr. Evonnie regarding Parkinson's disease.  Stable at this time.  Compliant with medication.  Patient is on Lexapro  for anxiety (prior depression as well).  Stable at this time.  She is interested in possibly getting off in the future.  She is working on weight loss.  She states that she has a psychologist, educational.  Will discuss medication options today.  Patient Active Problem List   Diagnosis Date Noted   Anxiety 08/05/2024   Status post total right knee replacement 05/01/2024   Status post total left knee replacement 01/17/2024   Hyperlipidemia 11/08/2022   Class 2 severe obesity due to excess calories with serious comorbidity and body mass index (BMI) of 37.0 to 37.9 in adult 11/08/2022   Parkinson's disease (HCC) 01/16/2021    Social Hx   Social History   Socioeconomic History   Marital status: Married    Spouse name: Not on file   Number of children: Not on file   Years of education: Not on file   Highest education level: Associate degree: occupational, scientist, product/process development, or vocational program  Occupational History   Not on file  Tobacco Use   Smoking status: Never   Smokeless tobacco: Never  Vaping Use   Vaping status: Never Used  Substance and Sexual Activity   Alcohol use: No   Drug use: No   Sexual activity: Yes    Partners: Male    Birth control/protection: Post-menopausal  Other Topics Concern   Not on file  Social History Narrative   Right Handed   Lives in a one story with basement   Drinks Caffeine    Social Drivers of Health   Financial Resource  Strain: Low Risk  (07/29/2024)   Overall Financial Resource Strain (CARDIA)    Difficulty of Paying Living Expenses: Not hard at all  Food Insecurity: No Food Insecurity (07/29/2024)   Hunger Vital Sign    Worried About Running Out of Food in the Last Year: Never true    Ran Out of Food in the Last Year: Never true  Transportation Needs: No Transportation Needs (07/29/2024)   PRAPARE - Administrator, Civil Service (Medical): No    Lack of Transportation (Non-Medical): No  Physical Activity: Sufficiently Active (07/29/2024)   Exercise Vital Sign    Days of Exercise per Week: 6 days    Minutes of Exercise per Session: 50 min  Stress: No Stress Concern Present (07/29/2024)   [employer] of Occupational Health - Occupational Stress Questionnaire    Feeling of Stress: Only a little  Social Connections: Socially Integrated (07/29/2024)   Social Connection and Isolation Panel    Frequency of Communication with Friends and Family: More than three times a week    Frequency of Social Gatherings with Friends and Family: Once a week    Attends Religious Services: More than 4 times per year    Active Member of [employer] or Organizations: Yes    Attends Engineer, Structural: More than 4 times per year    Marital Status: Married  Review of Systems  Gastrointestinal: Negative.   Neurological:  Positive for tremors.   Objective:  BP 132/70   Pulse 79   Ht 5' 4 (1.626 m)   Wt 216 lb (98 kg)   LMP 06/08/2010   SpO2 97%   BMI 37.08 kg/m      08/05/2024   10:18 AM 08/05/2024    9:35 AM 05/02/2024    4:58 AM  BP/Weight  Systolic BP 132 153 138  Diastolic BP 70 66 61  Wt. (Lbs)  216   BMI  37.08 kg/m2     Physical Exam Vitals and nursing note reviewed.  Constitutional:      General: She is not in acute distress.    Appearance: Normal appearance.  HENT:     Head: Normocephalic and atraumatic.  Cardiovascular:     Rate and Rhythm: Normal rate and regular  rhythm.  Pulmonary:     Effort: Pulmonary effort is normal.     Breath sounds: No wheezing, rhonchi or rales.  Abdominal:     General: There is no distension.     Palpations: Abdomen is soft.     Tenderness: There is no abdominal tenderness.  Neurological:     Mental Status: She is alert.     Comments: Tremor noted, left foot.  Psychiatric:        Mood and Affect: Mood normal.        Behavior: Behavior normal.     Lab Results  Component Value Date   WBC 10.6 (H) 05/02/2024   HGB 10.6 (L) 05/02/2024   HCT 32.9 (L) 05/02/2024   PLT 190 05/02/2024   GLUCOSE 126 (H) 05/02/2024   CHOL 239 (H) 11/08/2022   TRIG 71.0 11/08/2022   HDL 64.60 11/08/2022   LDLDIRECT 158.0 02/23/2009   LDLCALC 161 (H) 11/08/2022   ALT 5 05/07/2022   AST 17 05/07/2022   NA 135 05/02/2024   K 4.1 05/02/2024   CL 103 05/02/2024   CREATININE 0.59 05/02/2024   BUN 15 05/02/2024   CO2 26 05/02/2024   TSH 1.90 05/07/2022   HGBA1C 5.6 03/22/2022     Assessment & Plan:  Pure hypercholesterolemia Assessment & Plan: Uncontrolled.  ASCVD risk or is below.  Patient wants to continue with lifestyle changes. The 10-year ASCVD risk score (Arnett DK, et al., 2019) is: 8.3%   Values used to calculate the score:     Age: 34 years     Clincally relevant sex: Female     Is Non-Hispanic African American: No     Diabetic: No     Tobacco smoker: No     Systolic Blood Pressure: 132 mmHg     Is BP treated: No     HDL Cholesterol: 64.6 mg/dL     Total Cholesterol: 239 mg/dL   Orders: -     Lipid panel  Elevated BP without diagnosis of hypertension -     CMP14+EGFR  Anemia, unspecified type -     CBC  Parkinson's disease with dyskinesia and fluctuating manifestations (HCC) Assessment & Plan: Stable currently.  Continue follow-up with Dr. Evonnie.   Anxiety Assessment & Plan: Continuing Lexapro  at this time.  Will continue to reevaluate and potentially decrease/taper off in the future.   Class 2  severe obesity due to excess calories with serious comorbidity and body mass index (BMI) of 37.0 to 37.9 in adult Assessment & Plan: We discussed medication options.  Patient wants to continue with diet and lifestyle changes  at this time.   Other orders -     Escitalopram  Oxalate; Take 1 tablet (20 mg total) by mouth daily.  Dispense: 90 tablet; Refill: 3    Follow-up:  6 months to 1 year  Jourden Delmont DO [facility]

## 2024-08-05 NOTE — Assessment & Plan Note (Signed)
 Stable currently.  Continue follow-up with Dr. Evonnie.

## 2024-08-05 NOTE — Assessment & Plan Note (Signed)
 Continuing Lexapro  at this time.  Will continue to reevaluate and potentially decrease/taper off in the future.

## 2024-08-10 ENCOUNTER — Encounter: Payer: Self-pay | Admitting: Radiology

## 2024-08-18 ENCOUNTER — Telehealth: Payer: Self-pay | Admitting: *Deleted

## 2024-08-18 NOTE — Telephone Encounter (Signed)
 Spoke with patient and discussed how she is doing. She is trying to stay active and doing personal training along with exercise classes. Doing really well, but does like an occasional muscle relaxer at night. She is down to just a couple of Tizanidine  and wanted to know if she could get a refill to help at night. Thanks.

## 2024-08-19 ENCOUNTER — Other Ambulatory Visit: Payer: Self-pay | Admitting: Orthopaedic Surgery

## 2024-08-19 MED ORDER — TIZANIDINE HCL 4 MG PO TABS: 4.0000 mg | ORAL_TABLET | Freq: Three times a day (TID) | ORAL | 0 refills | Status: AC | PRN

## 2024-09-14 ENCOUNTER — Other Ambulatory Visit: Payer: Self-pay

## 2024-09-14 ENCOUNTER — Encounter: Payer: Self-pay | Admitting: Orthopaedic Surgery

## 2024-09-14 ENCOUNTER — Ambulatory Visit: Admitting: Orthopaedic Surgery

## 2024-09-14 DIAGNOSIS — Z96653 Presence of artificial knee joint, bilateral: Secondary | ICD-10-CM

## 2024-09-14 NOTE — Progress Notes (Signed)
 The patient is an active 68 year old female who is here in follow-up as a relates to having both of her knees replaced.  Her left knee was replaced in April of this year and the right knee replaced in July of this year.  She had a harder time recovering from her right knee than her left knee but she says both of them are doing well overall.  She is ready to get back into step kicks and other activities.  She is walking without any significant limp or assistive device.  She reports more stiffness when she has been sitting for a while with her right knee but does state that both knees are doing well.  On exam both knees have good range of motion and feel stable on ligamentous exam.  X-rays of both knees show well-seated cemented total knee arthroplasties with no complicating features.  She will continue to increase her activities as comfort allows.  If there is any issues she will let us  know.  There are no restrictions in terms of her workout routine.  We will see her back in 6 months with a final AP and lateral both knees.  If there are issues before then she has to reach out and let us  know.

## 2024-09-24 ENCOUNTER — Other Ambulatory Visit: Payer: Self-pay | Admitting: Neurology

## 2024-09-24 DIAGNOSIS — G20A1 Parkinson's disease without dyskinesia, without mention of fluctuations: Secondary | ICD-10-CM

## 2024-10-20 NOTE — Progress Notes (Unsigned)
 "   Assessment/Plan:   1.  Parkinsons Disease  -Continue pramipexole  0.5 mg 3 times per day.    -increase carbidopa /levodopa  25/100 to 6am/10am/2pm/6pm  -having some curling of toes but she will pay attention to timing in relationship to dosing.  Above changes may help  -we discussed focused ultrasound and DBS.  Don't think that she needs either right now.    2.  GAD  -continue lexapro , 20 mg.  She asked about potentially changing to wellbutrin but I think that this will potentially increase anxiety.  She decide to continue the lexapro   -she has stress with sister with AD, who is now in assisted living at [facility].  This has been an increasing issue and proud of her for doing the counseling  3.  Intermittent insomnia  -melatonin without relief  4.  Knee pain b/l  -had L TKA 01/17/24  - She had right total knee arthroplasty on May 01, 2024  Subjective:   Sara Nicholson was seen today in follow up for Parkinsons disease.  My previous records were reviewed prior to todays visit as well as outside records available to me.  Patient continues on her pramipexole , levodopa  and escitalopram .  We added 1 extra dose of levodopa  last visit and she tolerated that well.  She has had no falls since last visit.  She did have right total knee arthroplasty done since last visit and has recovered well.  Current prescribed movement disorder medications: Pramipexole  0.5 mg 3 times per day  Carbidopa /levodopa  25/100, 1 tablet 4 times per day at 6 AM/10 AM/2 PM/6 PM (extra dose added last visit) Lexapro , 20 mg daily   ALLERGIES:   Allergies  Allergen Reactions   Chlorhexidine  Rash   Tape Rash    prefers fabric band-aids    Terbinafine Hcl Hives    CURRENT MEDICATIONS:  Outpatient Encounter Medications as of 10/22/2024  Medication Sig   carbidopa -levodopa  (SINEMET  IR) 25-100 MG tablet 6am/10am/2pm/6pm   escitalopram  (LEXAPRO ) 20 MG tablet Take 1 tablet (20 mg total) by mouth daily.    pramipexole  (MIRAPEX ) 0.5 MG tablet TAKE ONE TABLET BY MOUTH THREE TIMES A DAY (08:00AM, 01:00PM, AND 06:00PM)   tiZANidine  (ZANAFLEX ) 4 MG tablet Take 1 tablet (4 mg total) by mouth every 8 (eight) hours as needed for muscle spasms.   No facility-administered encounter medications on file as of 10/22/2024.    Objective:   PHYSICAL EXAMINATION:    VITALS:   There were no vitals filed for this visit.   Wt Readings from Last 3 Encounters:  08/05/24 216 lb (98 kg)  05/01/24 220 lb 7.4 oz (100 kg)  04/21/24 221 lb (100.2 kg)     Wt Readings from Last 3 Encounters:  08/05/24 216 lb (98 kg)  05/01/24 220 lb 7.4 oz (100 kg)  04/21/24 221 lb (100.2 kg)     GEN:  The patient appears stated age and is in NAD. HEENT:  Normocephalic, atraumatic.  The mucous membranes are moist. The superficial temporal arteries are without ropiness or tenderness. CV:  RRR Lungs:  CTAB Neck/HEME:  There are no carotid bruits bilaterally.  Neurological examination:  Orientation: The patient is alert and oriented x3. Cranial nerves: There is good facial symmetry without facial hypomimia. The speech is fluent and clear. Soft palate rises symmetrically and there is no tongue deviation. Hearing is intact to conversational tone. Sensation: Sensation is intact to light touch throughout Motor: Strength is at least antigravity x4.  Movement examination: Tone:  There is normal tone in the UE/LE Abnormal movements: there is RUE rest tremor and LLE rest tremor Coordination:  There is no decremation with RAM's, with any form of RAMS, including alternating supination and pronation of the forearm, hand opening and closing, finger taps, heel taps and toe taps. Gait and Station: The patient is able to arise without the use of the hands.  She is a bit antalgic.    I have reviewed and interpreted the following labs independently    Chemistry      Component Value Date/Time   NA 135 05/02/2024 0335   NA 142  04/07/2019 0822   K 4.1 05/02/2024 0335   CL 103 05/02/2024 0335   CO2 26 05/02/2024 0335   BUN 15 05/02/2024 0335   BUN 12 04/07/2019 0822   CREATININE 0.59 05/02/2024 0335      Component Value Date/Time   CALCIUM 8.7 (L) 05/02/2024 0335   ALKPHOS 92 05/07/2022 0946   AST 17 05/07/2022 0946   ALT 5 05/07/2022 0946   BILITOT 0.5 05/07/2022 0946   BILITOT 0.4 04/07/2019 0822       Lab Results  Component Value Date   WBC 10.6 (H) 05/02/2024   HGB 10.6 (L) 05/02/2024   HCT 32.9 (L) 05/02/2024   MCV 93.2 05/02/2024   PLT 190 05/02/2024    Lab Results  Component Value Date   TSH 1.90 05/07/2022   Total time spent on today's visit was *** minutes, including both face-to-face time and nonface-to-face time.  Time included that spent on review of records (prior notes available to me/labs/imaging if pertinent), discussing treatment and goals, answering patient's questions and coordinating care.   Cc:  Cook, Jayce G, DO "

## 2024-10-22 ENCOUNTER — Ambulatory Visit: Admitting: Neurology

## 2024-10-22 ENCOUNTER — Encounter: Payer: Self-pay | Admitting: Neurology

## 2024-10-22 VITALS — BP 136/84 | HR 80 | Wt 214.6 lb

## 2024-10-22 DIAGNOSIS — F411 Generalized anxiety disorder: Secondary | ICD-10-CM

## 2024-10-22 DIAGNOSIS — G20A1 Parkinson's disease without dyskinesia, without mention of fluctuations: Secondary | ICD-10-CM | POA: Diagnosis not present

## 2024-10-22 MED ORDER — CARBIDOPA-LEVODOPA 25-100 MG PO TABS: ORAL_TABLET | ORAL | 1 refills | Status: AC

## 2024-10-22 NOTE — Patient Instructions (Signed)
" °  VISIT SUMMARY: Today, we discussed the management of your Parkinson's disease and generalized anxiety disorder. You are doing well with your current medications and have been physically active without any major issues. Your mood is stable, and you are recovering well from your recent knee replacement surgery.  YOUR PLAN: -PARKINSON'S DISEASE: Parkinson's disease is a disorder of the nervous system that affects movement. Your condition is mostly controlled with your current medications, although the afternoon dose of levodopa  is not always effective. We have sent a refill for your carbidopa -levodopa  and confirmed that your pramipexole  refills are current. We also removed tizanidine  from your active medication list as you are not taking it.  -GENERALIZED ANXIETY DISORDER: Generalized anxiety disorder is characterized by persistent and excessive worry about various aspects of life. Your anxiety is well-managed with escitalopram , and your mood is stable. We have confirmed that your escitalopram  refills are current.  INSTRUCTIONS: Please continue taking your medications as prescribed. If you experience any new symptoms or have concerns, schedule a follow-up appointment. Keep up with your physical activities and avoid jumping as you recover from your knee replacement surgery.                      Contains text generated by [employer].                                 Contains text generated by [employer].   "

## 2024-11-05 ENCOUNTER — Telehealth: Payer: Self-pay | Admitting: Family Medicine

## 2024-11-05 NOTE — Telephone Encounter (Signed)
 Call Center sent this message   Did the patient discuss referral with their provider in the last year? Yes    (If No - schedule appointment)    (If Yes - send message)        Appointment offered? Yes        Type of order/referral and detailed reason for visit: Dermatologist        Preference of office, provider, location: Amy Jordan MD, [address] [place], [place] ph: [phone]        If referral order, have you been seen by this specialty before? Yes    (If Yes, this issue or another issue? When? Where?10 years      Can we respond through MyChart? Yes

## 2024-11-11 ENCOUNTER — Other Ambulatory Visit: Payer: Self-pay

## 2024-11-11 DIAGNOSIS — D229 Melanocytic nevi, unspecified: Secondary | ICD-10-CM

## 2024-11-11 DIAGNOSIS — G20B2 Parkinson's disease with dyskinesia, with fluctuations: Secondary | ICD-10-CM

## 2024-11-11 NOTE — Telephone Encounter (Signed)
 Referral sent called pt and she expressed gratitude

## 2025-02-03 ENCOUNTER — Ambulatory Visit: Admitting: Family Medicine

## 2025-03-15 ENCOUNTER — Ambulatory Visit: Admitting: Orthopaedic Surgery

## 2025-04-22 ENCOUNTER — Ambulatory Visit: Payer: Self-pay | Admitting: Neurology
